# Patient Record
Sex: Male | Born: 1950 | Race: Black or African American | Hispanic: No | Marital: Single | Smoking: Former smoker
Health system: Southern US, Community
[De-identification: ages and names within clinical notes are randomized; demographics above are authoritative.]

## PROBLEM LIST (undated history)

## (undated) DIAGNOSIS — I639 Cerebral infarction, unspecified: Secondary | ICD-10-CM

## (undated) DIAGNOSIS — I10 Essential (primary) hypertension: Secondary | ICD-10-CM

## (undated) DIAGNOSIS — C801 Malignant (primary) neoplasm, unspecified: Secondary | ICD-10-CM

## (undated) DIAGNOSIS — Z5189 Encounter for other specified aftercare: Secondary | ICD-10-CM

## (undated) DIAGNOSIS — R569 Unspecified convulsions: Secondary | ICD-10-CM

## (undated) DIAGNOSIS — I219 Acute myocardial infarction, unspecified: Secondary | ICD-10-CM

## (undated) DIAGNOSIS — I509 Heart failure, unspecified: Secondary | ICD-10-CM

## (undated) DIAGNOSIS — J449 Chronic obstructive pulmonary disease, unspecified: Secondary | ICD-10-CM

## (undated) DIAGNOSIS — D689 Coagulation defect, unspecified: Secondary | ICD-10-CM

## (undated) DIAGNOSIS — N189 Chronic kidney disease, unspecified: Secondary | ICD-10-CM

## (undated) DIAGNOSIS — D649 Anemia, unspecified: Secondary | ICD-10-CM

## (undated) HISTORY — DX: Unspecified convulsions: R56.9

## (undated) HISTORY — DX: Malignant (primary) neoplasm, unspecified: C80.1

## (undated) HISTORY — DX: Encounter for other specified aftercare: Z51.89

## (undated) HISTORY — DX: Chronic obstructive pulmonary disease, unspecified: J44.9

## (undated) HISTORY — DX: Chronic kidney disease, unspecified: N18.9

## (undated) HISTORY — DX: Anemia, unspecified: D64.9

## (undated) HISTORY — DX: Essential (primary) hypertension: I10

## (undated) HISTORY — PX: PORT-A-CATH REMOVAL: SHX5289

## (undated) HISTORY — DX: Heart failure, unspecified: I50.9

## (undated) HISTORY — DX: Coagulation defect, unspecified: D68.9

## (undated) HISTORY — PX: TRACHEOSTOMY: SUR1362

## (undated) HISTORY — DX: Cerebral infarction, unspecified: I63.9

## (undated) HISTORY — PX: PICC LINE REMOVAL (ARMC HX): HXRAD1261

## (undated) HISTORY — DX: Acute myocardial infarction, unspecified: I21.9

## (undated) HISTORY — PX: GASTROSTOMY W/ FEEDING TUBE: SUR642

---

## 2016-03-08 ENCOUNTER — Other Ambulatory Visit: Payer: Self-pay | Admitting: Hematology and Oncology

## 2016-03-09 ENCOUNTER — Telehealth: Payer: Self-pay | Admitting: *Deleted

## 2016-03-09 NOTE — Telephone Encounter (Signed)
Oncology Nurse Navigator Documentation  Spoke with patient's niece, Lizbeth Wodrich, explained I have been unable to reach Mr. Kipps by phone.  She indicated she has moved her uncle to [place] to better facilitate his care/treatment.  I explained by role has their navigator, provided my contact information.  I informed her of 10/3 2:00 PM appt with Dr. Alvy Bimler, confirmed her understanding of [place] location, arrival/registration procedure.  She indicated caregiver Margie Ege will be accompanying him to this appointment. She understands she can call me with questions/concerns.  Gayleen Orem, RN, BSN, [place] at [place] [phone]

## 2016-03-09 NOTE — Telephone Encounter (Signed)
Oncology Nurse Navigator Documentation  Called Mr. Eans to place new referral introductory call, confirm 10/3 2:00 appt with Dr. Alvy Bimler.  LVM asking him to return my call.  Gayleen Orem, RN, BSN, [place] at [place] [phone]

## 2016-03-09 NOTE — Telephone Encounter (Signed)
Oncology Nurse Navigator Documentation  Located 08/21/13 pathology at [facility], forwarded information to Milinda Antis, [facility], for requesting slides; submitted fax request to [facility] for recent imaging, receipt confirmation received.  Patient to be presented at [facility] next week.   Gayleen Orem, RN, BSN, [place] at [place] [phone]

## 2016-03-13 ENCOUNTER — Telehealth: Payer: Self-pay | Admitting: *Deleted

## 2016-03-13 ENCOUNTER — Encounter: Payer: Self-pay | Admitting: Hematology and Oncology

## 2016-03-13 ENCOUNTER — Encounter: Payer: Self-pay | Admitting: *Deleted

## 2016-03-13 ENCOUNTER — Ambulatory Visit (HOSPITAL_BASED_OUTPATIENT_CLINIC_OR_DEPARTMENT_OTHER): Payer: Medicare Other | Admitting: Hematology and Oncology

## 2016-03-13 ENCOUNTER — Other Ambulatory Visit: Payer: Self-pay | Admitting: Hematology and Oncology

## 2016-03-13 ENCOUNTER — Telehealth: Payer: Self-pay | Admitting: Hematology and Oncology

## 2016-03-13 VITALS — BP 103/71 | HR 98 | Temp 97.5°F | Resp 18 | Ht 73.0 in | Wt 178.9 lb

## 2016-03-13 DIAGNOSIS — C7801 Secondary malignant neoplasm of right lung: Secondary | ICD-10-CM

## 2016-03-13 DIAGNOSIS — I878 Other specified disorders of veins: Secondary | ICD-10-CM | POA: Diagnosis not present

## 2016-03-13 DIAGNOSIS — J449 Chronic obstructive pulmonary disease, unspecified: Secondary | ICD-10-CM

## 2016-03-13 DIAGNOSIS — C139 Malignant neoplasm of hypopharynx, unspecified: Secondary | ICD-10-CM

## 2016-03-13 DIAGNOSIS — C7802 Secondary malignant neoplasm of left lung: Secondary | ICD-10-CM | POA: Diagnosis not present

## 2016-03-13 DIAGNOSIS — I209 Angina pectoris, unspecified: Secondary | ICD-10-CM

## 2016-03-13 DIAGNOSIS — I509 Heart failure, unspecified: Secondary | ICD-10-CM | POA: Insufficient documentation

## 2016-03-13 DIAGNOSIS — Z66 Do not resuscitate: Secondary | ICD-10-CM

## 2016-03-13 DIAGNOSIS — I2782 Chronic pulmonary embolism: Secondary | ICD-10-CM | POA: Diagnosis not present

## 2016-03-13 DIAGNOSIS — C7800 Secondary malignant neoplasm of unspecified lung: Secondary | ICD-10-CM | POA: Insufficient documentation

## 2016-03-13 DIAGNOSIS — Z23 Encounter for immunization: Secondary | ICD-10-CM | POA: Diagnosis present

## 2016-03-13 DIAGNOSIS — Z7189 Other specified counseling: Secondary | ICD-10-CM

## 2016-03-13 DIAGNOSIS — Z5111 Encounter for antineoplastic chemotherapy: Secondary | ICD-10-CM

## 2016-03-13 HISTORY — DX: Chronic obstructive pulmonary disease, unspecified: J44.9

## 2016-03-13 MED ORDER — NITROGLYCERIN 0.3 MG SL SUBL: 0.3000 mg | SUBLINGUAL_TABLET | SUBLINGUAL | 12 refills | Status: DC | PRN

## 2016-03-13 MED ORDER — INFLUENZA VAC SPLIT QUAD 0.5 ML IM SUSY
0.5000 mL | PREFILLED_SYRINGE | Freq: Once | INTRAMUSCULAR | Status: AC
  Administered 2016-03-13: 0.5 mL via INTRAMUSCULAR
  Filled 2016-03-13: qty 0.5

## 2016-03-13 NOTE — Telephone Encounter (Signed)
Oncology Nurse Navigator Documentation  Returned Mr. Tolley's niece's call, confirmed today's 2:00 PM appt with Dr. Alvy Bimler, encouraged 1:40 arrival to register.  She voiced understanding.  Gayleen Orem, RN, BSN, [place] at [place] [phone]

## 2016-03-13 NOTE — Telephone Encounter (Signed)
PATIENT ALSO GIVEN APPOINTMENT WITH DR Winfield 10/19

## 2016-03-13 NOTE — Telephone Encounter (Signed)
GAVE PATIENT AVS REPORT AND APPOINTMENTS FOR October AND November. DATES PER 10/3 LOS.

## 2016-03-13 NOTE — Progress Notes (Signed)
Met with Mr. Masso during initial consult with Dr. Alvy Bimler.  He was accompanied by family friend Margie Ege. 1. Further introduced myself as his/their Navigator, explained my role as a member of the Care Team. 2. Provided New Patient Information packet:  Contact information for physician, this navigator, other members of the Care Team  Advance Directive information ([place] blue pamphlet with LCSW insert).  He stated he has HCPOA, will bring copy.  I provided [facility] AD document for Living Will.  Fall Prevention Patient Safety Plan  Appointment Guideline  Financial Assistance Information sheet.  I indicated I will arrange Financial Counseling in conjunction with next appt.  Power with highlight of [place] 3. Assisted with post-consult appt scheduling.   They verbalized understanding of information provided. I encouraged them to call with questions/concerns, they verbalized understanding.  Gayleen Orem, RN, BSN, [place] at [place] [phone]

## 2016-03-14 DIAGNOSIS — I2699 Other pulmonary embolism without acute cor pulmonale: Secondary | ICD-10-CM | POA: Insufficient documentation

## 2016-03-14 DIAGNOSIS — I878 Other specified disorders of veins: Secondary | ICD-10-CM | POA: Insufficient documentation

## 2016-03-14 NOTE — Assessment & Plan Note (Signed)
The patient has past history of extensive smoking and severe emphysema. He has significant shortness of breath on minimal exertion. He requests oxygen therapy. Based on current vital signs, the patient does not qualify for oxygen therapy. However, I plan to refer him to pulmonology clinic for further evaluation

## 2016-03-14 NOTE — Assessment & Plan Note (Signed)
We discussed CODE STATUS today and the patient confirmed DO NOT RESUSCITATE

## 2016-03-14 NOTE — Assessment & Plan Note (Signed)
He had history of DVT and PE. He is on chronic anticoagulation therapy. The patient denies any recent signs or symptoms of bleeding such as spontaneous epistaxis, hematuria or hematochezia.

## 2016-03-14 NOTE — Assessment & Plan Note (Signed)
The patient has significant history of coronary artery disease and atrial fibrillation in the past. He has regular angina symptoms. I refill his prescription nitroglycerin. According to his caregiver, he has appointment to see cardiologist soon

## 2016-03-14 NOTE — Assessment & Plan Note (Signed)
The patient have history of thrombosis and PE. He is on chronic anticoagulation therapy. His initial thrombotic event was precipitated by port placement and PICC line placement. He is noted to have poor venous access but the patient declined placement of PICC line or port for chemotherapy

## 2016-03-14 NOTE — Progress Notes (Signed)
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 male is here, transfer of care from [place] after the patient moved to live [place] a month ago His caregiver, Tye [place] is present. I review his outside records extensively and summarized as follows:   Hypopharyngeal cancer ([place])   08/23/2013 Imaging    A CT of the neck on 08/23/13 showed a supraglottic mass 3.0X3.5cm extending to true vocal cords to above the hyoid involving the right aryepiglottic fold. No lymphadenopathy in the neck. A CT of the chest on 08/23/13 showed bullous emphysema and 2 nodules possibly representing metastatic disease including a 1.4cm left upper lobe nodule and a 1.4cm nodule in the right middle lobe.              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 Bilateral hypermetabolic pulmonary nodules are concerning for bilateral lung metastases.      09/14/2013 - 11/09/2013 Chemotherapy    The patient had weekly cisplatin x 3 with radiation at [facility]. Chemo was stopped due to intolerable toxicities       09/14/2013 - 10/12/2013 Radiation Therapy    He received radiation therapy at [facility]. IMRT was used to treat the primary tumor and regional lymphatics to 4600cGy in 200cGy daily fractions over 23 days followed by a boost of 2400cGy in 12 days to a total dose of 7000cGy.        09/22/2013 Procedure    He has placement of port       09/29/2013 Procedure    He has placement of feeding tube      10/12/2013 Procedure    He has removal of port      10/17/2013 Imaging    Ct neck showed thrombus is present in the jugular  vein from the skull base to the brachiocephalic vein. A small venous branch or collateral also demonstrates thrombus in the anterior right neck. The SVC is patent. The right-sided laryngeal mass is smaller with decreased but persistent right to left mass effect on the airway. Severe atherosclerotic changes at the bifurcation of the carotid arteries on the left, greater than right. In the dermis of the upper anterior right chest wall there is a small fluid and air collection measuring 2.3 x 1.1 cm.      10/17/2013 Imaging    Ct chest showed The SVC is patent however there is thrombus extending throughout the jugular vein to just below the brachiocephalic vein. There is also likely thrombus and a small peripheral vein in the anterior right neck. Several pulmonary nodules, one larger nodule appears smaller with another larger nodule stable and several new small pulmonary nodules. Follow-up recommended. New mild right lower lobe airspace disease with several nodular opacities. This may represent infection. No pleural effusion.      01/01/2014 Imaging    Ct chest showed Interval improvement in heterogeneous right lower lobe opacity, likely improving infection/aspiration. Previously visualized FDG avid left upper lobe nodule is less solid compared to most recent prior but not significantly changed in size. Previously visualized FDG avid right middle lobe nodule has increased in size compared to most recent  prior, likely disease progression. Significant interval increase in size of left upper lobe nodules consistent with progressive metastatic disease. Other smaller previously visualized nodules are not visualized and were likely inflammatory/infectious in etiology.      01/01/2014 Imaging    Ct neck showed thrombus present in the jugular vein from skull base to brachiocephalic vein, the SVC remains patent. Extensive atherosclerotic calcifications of the carotid bifurcations left greater than right the mass in  the hypopharynx. Improved appearance of the hypopharyngeal mass with less edema and last less shift to the left. Less edema and fluid within the adjacent soft tissues also demonstrated. Increased fluid in the retropharyngeal prevertebral soft tissues.      04/15/2014 Imaging    CT pulmonary arteriogram demonstrates no evidence of pulmonary embolism. Bilateral pulmonary lesions demonstrated enlarged since the prior study. A new lesion is also seen within the lingula. Findings would be typical of pulmonary metastatic lesions. Focal parenchymal infiltrate demonstrated in the right lower lobe. Left basilar atelectasis. Middle lobe infiltrate or atelectasis demonstrated with underlying bronchiectasis. Lingular infiltrate or atelectasis also      05/03/2014 Imaging    Ct chest showed interval enlargement of at least 2 pulmonary nodules as described above. Findings are suspicious for worsening disease.  Interval development of consolidation within the lateral segment right middle lobe could be related to a filling defect in a branch of the lateral segment right middle lobe bronchus. The consolidation favored to represent atelectasis. The endobronchial focus could represent mucoid impactionor possibly a fixed endobronchial lesion. Aspiration would be less likely given its location in the right middle lobe.      05/03/2014 Imaging    Ct neck showed no significant change in right hypopharyngeal mass. Stable airway asymmetry. Ill-defined soft tissue along the carotid space carotid encasement mildly improved. Multispatial soft tissue stranding/fluid has improved. Right internal jugular vein not visualized..      05/13/2014 Procedure    Tracheostomy was removed         06/16/2014 - 03/16/2015 Chemotherapy    The patient had carboplatin AUC 2 and Taxol 45 mg/m2 at [facility]       06/16/2014 Imaging    Ct chest showed stable dominant nodules from recent prior. Presumably, metastatic disease. New increased  groundglass attenuation opacities, right lower lobe, centrilobular and clumped nodules, as well as a more dominant nodular opacity. While these findings could be secondary to progressive metastatic disease, given their location and appearance, aspiration pneumonia/pneumonitis is also possible.      06/21/2014 Imaging    Ct abdomen showed gastrostomy tube is positioned within the stomach. No evidence for an acute inflammatory process within the abdomen or pelvis. No large or small bowel obstruction. No change right lower lobe airspace disease and nodular opacities      09/30/2014 Imaging    Ct chest showed interval decrease in size of metastatic pulmonary nodules. One of these nodules demonstrates new cavitation within the right upper lobe. Interval improvement of nodular opacities within the right lower lobe suggestive of a resolving aspiration.      04/01/2015 Imaging    Ct chest showed small amount of right upper lobe acute, nonocclusive pulmonary embolus. Enlarged left upper lobe pulmonary metastasis. Mixed response with resolved previous smaller nodule in the lingula. Right middle and right lower lobe appearance likely represents posttreatment cicatrization atelectasis (query if radiation therapy has been performed in this region). Worsened right infrahilar bronchial wall thickening with occlusion of a subsegmental branch. No evidence for postobstructive pneumonia.  Paraseptal emphysema.      04/19/2015 - 02/29/2016 Chemotherapy    The patient had Keytruda at [facility].  He subsequently relocated to [facility]       04/29/2015 Imaging    Ct neck showed interval significant reduction in size of previous large right supraglottic mass. Airway is now widely patent at this level. No definite discrete supraglottic or pharyngeal mass demonstrated currently by CT. Left upper lobe spiculated mass. Prominent calcified atherosclerotic plaquing involving the carotid bifurcations and carotid bulbs greater on the  left resulting in stenosis of the internal carotid artery origins. Consider follow-up carotid Doppler ultrasound exam for further evaluation.      07/13/2015 Imaging    Ct neck showed expected post-treatment changes in the neck without evidence of recurrent disease in the primary site. No abnormal lymph nodes. Significant biapical bullous disease /paraseptal emphysema in the lungs, unchanged from prior. Previously seen left upper lobe nodular mass is visualized on the same-day CT chest.       07/13/2015 Imaging    Ct chest showed interval decrease in size of a spiculated left upper lobe nodule. Numerous additional nonspecific pulmonary nodules of the bilateral lungs, which are stable compared to prior examination. Clustered tree-in-bud nodules of the right lung base are likely sequelae of Aspiration. Bronchiectasis and atelectasis of the right middle lobe. Emphysema.      08/24/2015 Miscellaneous    He received IV feraheme      09/20/2015 Imaging    CT scan showed acute, comminuted, medial right clavicular fracture and marked, overlying soft tissue swelling. Mild, acute, T10 compression fracture.  The known left upper lobe carcinoma has modestly decreased in size since the 04/01/2015 comparison. Perhaps there has been a response to interim therapy. Right middle lobe and lower lobe bronchiectasis and pleuroparenchymal scarring were also present on the 04/01/2015 comparison. Confluent consolidation, however, is less striking than on the prior. Perhaps this is residua of prior pneumonia or radiation. Marked bullous emphysema is unchanged. Advanced, 3-vessel coronary artery disease. The liver is unusual in shape, but the findings are not definitive for cirrhosis. The appearance is unchanged since 08/19/2014.      09/20/2015 Imaging    Ct head showed mild patchy low-density present in the periventricular white matter. No mass, hemorrhage, or extracerebral fluid.  Mild supratentorial ventricle and sulcal  prominence within normal limits.  A component of diffuse cerebellar atrophy is present. Carotid siphon and distal left vertebral vascular calcifications are present. On bone windows, no calvarial lesion. Normal mastoid aeration. Mucosal thickening left posterior ethmoid air cells and retention cyst left maxillary sinus.      10/05/2015 Imaging    CT chest, abdomen and pelvis showed increasing size of spiculated nodule in the left upper lobe. Increasing adjacent groundglass opacities are concerning for lymphangitic spread of tumor. Additional pulmonary nodules are unchanged. Multiple mildly enlarged mesenteric lymph nodes, similar to prior. Sclerotic changes and height loss of the T10 vertebral body, new from prior. This is concerning for osseous metastatic disease. Extensive atherosclerotic disease at the origin of the celiac and throughout the proximal superior mesenteric artery.      10/05/2015 Imaging    Ct neck showed decreased supraglottic edema, with residual asymmetric fullness right greater than left. Stable post-therapeutic retropharyngeal edema. Comminuted fracture of the proximal right clavicle/clavicular head with surrounding soft tissue and pectoral stranding, new in the interval. Findings could be posttraumatic and/or pathologic.      11/16/2015 Imaging    Ct chest showed stable dominant  left upper lobe nodule, and surrounding opacities which may be related to radiation. New 10-14-mm left upper lobe nodule, anterior to dominant nodule and radiation change. While this could also represent radiation change, infection or neoplasm are in the differential diagnosis      12/27/2015 Imaging    Ct chest showed stable to slight decrease in nodular consolidative opacities, which are likely related to prior therapy. No new abnormalities      01/31/2016 Imaging    CT brain at [facility] showed no metastatic disease. No acute intracranial process.      His treatment course at [facility] was complicated by  failure to thrive, recurrent infection with severe pneumonia, DVT related to line placement, PE, congestive heart failure, atrial fibrillation with rapid ventricular response, severe anemia requiring blood transfusions and occasional renal dysfunction. The patient also has history of seizure disorder and stroke but denies permanent neurological deficits. He has chronic, regular angina symptoms. He ran out of medications a month ago and has not taken any nitroglycerin recently. His last episode of chest pain was a week ago. The patient also have chronic shortness of breath with minimal exertion. He requests oxygen therapy. He denies changes in appetite, nausea, abnormal weight loss or recent peripheral neuropathy. The patient denies any recent signs or symptoms of bleeding such as spontaneous epistaxis, hematuria or hematochezia.  MEDICAL HISTORY:  Past Medical History:  Diagnosis Date  . Anemia   . Blood transfusion without reported diagnosis   . Cancer ([place])   . CHF (congestive heart failure) ([place])   . Chronic kidney disease   . Clotting disorder ([place])   . COPD (chronic obstructive pulmonary disease) ([place])   . Hypertension   . Myocardial infarction   . Seizures ([place])   . Stroke [facility])     SURGICAL HISTORY: Past Surgical History:  Procedure Laterality Date  . GASTROSTOMY W/ FEEDING TUBE    . PICC LINE REMOVAL (No Name HX)    . PORT-A-CATH REMOVAL    . TRACHEOSTOMY      SOCIAL HISTORY: Social History   Social History  . Marital status: Single    Spouse name: N/A  . Number of children: 2  . Years of education: N/A   Occupational History  . unemployed    Social History Main Topics  . Smoking status: Former Smoker    Packs/day: 1.00    Years: 35.00    Types: Cigarettes    Quit date: 02/08/2016  . Smokeless tobacco: Never Used  . Alcohol use 3.6 oz/week    6 Cans of beer per week  . Drug use: No  . Sexual activity: Not on file   Other Topics Concern  . Not on file    Social History Narrative   Judeen Hammans (brother's daughter) niece MPOA lives with patient    FAMILY HISTORY: History reviewed. No pertinent family history.  ALLERGIES:  is allergic to lisinopril.  MEDICATIONS:  Current Outpatient Prescriptions  Medication Sig Dispense Refill  . amLODipine (NORVASC) 10 MG tablet Take by mouth.    . diltiazem (CARDIZEM CD) 180 MG 24 hr capsule Take by mouth.    . gabapentin (NEURONTIN) 100 MG capsule Take by mouth.    Marland Kitchen guaiFENesin-codeine (ROBITUSSIN AC) 100-10 MG/5ML syrup Take by mouth.    . isosorbide dinitrate (ISORDIL) 20 MG tablet Take by mouth.    . magnesium oxide (MAG-OX) 400 MG tablet Take by mouth.    . OLANZapine (ZYPREXA) 5 MG tablet Take by mouth.    Marland Kitchen  ondansetron (ZOFRAN-ODT) 8 MG disintegrating tablet Take by mouth.    . oxyCODONE (OXY IR/ROXICODONE) 5 MG immediate release tablet Take 1 tab every 4 hours prn pain or shortness of breath    . pantoprazole (PROTONIX) 40 MG tablet Take by mouth.    . prochlorperazine (COMPAZINE) 10 MG tablet Take by mouth.    . rivaroxaban (XARELTO) 20 MG TABS tablet Take by mouth.    Marland Kitchen albuterol (ACCUNEB) 1.25 MG/3ML nebulizer solution Inhale into the lungs.    Marland Kitchen albuterol (PROVENTIL HFA;VENTOLIN HFA) 108 (90 Base) MCG/ACT inhaler Inhale into the lungs.    . nitroGLYCERIN (NITROSTAT) 0.3 MG SL tablet Place 1 tablet (0.3 mg total) under the tongue every 5 (five) minutes as needed for chest pain. 90 tablet 12   No current facility-administered medications for this visit.     REVIEW OF SYSTEMS:   Constitutional: Denies fevers, chills or abnormal night sweats Eyes: Denies blurriness of vision, double vision or watery eyes Ears, nose, mouth, throat, and face: Denies mucositis or sore throat Gastrointestinal:  Denies nausea, heartburn or change in bowel habits Skin: Denies abnormal skin rashes Lymphatics: Denies new lymphadenopathy or easy bruising Neurological:Denies numbness, tingling or new  weaknesses Behavioral/Psych: Mood is stable, no new changes  All other systems were reviewed with the patient and are negative.  PHYSICAL EXAMINATION: ECOG PERFORMANCE STATUS: 2 - Symptomatic, <50% confined to bed  Vitals:   03/13/16 1424  BP: 103/71  Pulse: 98  Resp: 18  Temp: 97.5 F (36.4 C)   Filed Weights   03/13/16 1424  Weight: 178 lb 14.4 oz (81.1 kg)    GENERAL:alert, no distress and comfortable. SKIN: skin color, texture, turgor are normal, no rashes or significant lesions EYES: normal, conjunctiva are pink and non-injected, sclera clear OROPHARYNX:no exudate, no erythema and lips, buccal mucosa, and tongue normal  NECK: Noted mild woodiness around his neck from prior radiation with well-healed tracheostomy scar LYMPH:  no palpable lymphadenopathy in the cervical, axillary or inguinal LUNGS: clear to auscultation and percussion with normal breathing effort HEART: regular rate & rhythm and no murmurs and no lower extremity edema ABDOMEN:abdomen soft, non-tender and normal bowel sounds Musculoskeletal:no cyanosis of digits and no clubbing  PSYCH: alert & oriented x 3 with mild dysarthria  NEURO: no focal motor/sensory deficits  LABORATORY DATA: I review his last set of blood work from [facility] I have reviewed the data as listed RADIOGRAPHIC STUDIES:I review all his imaging studies  ASSESSMENT & PLAN:  Hypopharyngeal cancer ([place]) The patient has complex history of stage IV metastatic hypopharyngeal carcinoma to the lungs, status post radiation therapy and is receiving systemic treatment. He relocated to [facility] a month due to family support. He appears to respond well to treatment and his last imaging study in August 2017 shows stable disease I will resume treatment next week and plan on repeating imaging study in November to assess response to treatment. Role of treatment is palliative and it is a recommendation according to NCCN guidelines  On January 14, 2015, the  [place]. Food and Drug Administration granted accelerated approval to pembrolizumab for the treatment of patients with recurrent or metastatic head and neck squamous cell carcinoma (HNSCC) with disease progression on or after platinum-containing chemotherapy.   The approval was based on demonstration of a durable objective response rate (ORR) in a subgroup of patients in an international, multicenter, non-randomized, open-label, multi-cohort study. This subgroup included 174 patients with recurrent or metastatic HNSCC who had disease progression on  or after platinum-containing chemotherapy. Patients received intravenous pembrolizumab 10 mg/kg every 2 weeks or 200 mg every 3 weeks.   ORR was determined by an independent review committee according to Response Evaluation Criteria in Solid Tumors (RECIST) 1.1. The ORR for these 174 patients was 16% (95% confidence interval [CI] 11, 22). The median response duration had not been reached at the time of analysis. The range for duration of response was 2.4 months to 27.7 months (response ongoing). Among the 28 responding patients, 23 (82%) had responses of 6 months or longer.   Safety data was evaluated in 192 patients with HNSCC receiving at least one dose of pembrolizumab 10 mg/kg every 2 weeks or 200 mg every 3 weeks. The most common (greater than or equal to 20%) adverse reactions were fatigue, decreased appetite, and dyspnea. Adverse reactions occurring in patients with HNSCC were similar to those occurring in patients with melanoma or NSCLC, with the exception of an increased incidence of facial edema (10% all grades, 2.1% grades 3-4) and new or worsening hypothyroidism (14.6% all grades). The most frequent (greater than or equal to 2%) serious adverse reactions were pneumonia, dyspnea, confusional state, vomiting, pleural effusion, and respiratory failure. Clinically significant immune-mediated adverse reactions included pneumonitis, colitis, hepatitis, adrenal  insufficiency, diabetes mellitus, skin toxicity, myositis, and thyroid disorders.   The recommended dose and schedule of pembrolizumab for this indication is 200 mg administered as an intravenous infusion over 30 minutes every 3 weeks.   We discussed some of the risks, benefits, side-effects of Pembrolizumab  Some of the short term side-effects included, though not limited to, risk of fatigue, pancytopenia, life-threatening infections, allergic reactions, nausea, vomiting, hepatitis, changes in bowel habits especially diarrhea, admission to hospital for various reasons, and risks of death.   The patient is aware that the response rates discussed earlier is not guaranteed.    After a long discussion, patient made an informed decision to proceed.   Patient education material was dispensed. The patient declined port placement. I will scheduled chemotherapy education class work and start of treatment next week per patient request  Angina pectoris [facility]) The patient has significant history of coronary artery disease and atrial fibrillation in the past. He has regular angina symptoms. I refill his prescription nitroglycerin. According to his caregiver, he has appointment to see cardiologist soon  COPD (chronic obstructive pulmonary disease) ([place]) The patient has past history of extensive smoking and severe emphysema. He has significant shortness of breath on minimal exertion. He requests oxygen therapy. Based on current vital signs, the patient does not qualify for oxygen therapy. However, I plan to refer him to pulmonology clinic for further evaluation  DNR (do not resuscitate) We discussed CODE STATUS today and the patient confirmed DO NOT RESUSCITATE  Goals of care, counseling/discussion The patient is aware he has stage IV disease and treatment is strictly palliative. We discussed importance of Advanced Directives and Living will. I will get assistance from our social worker to help  him fill out some paperwork. We discussed CODE STATUS; the patient desires DNR We also discussed potential referral for home base palliative care but the patient declined for now. We discussed goals of care. The patient does not wish to discuss prognosis.   Poor venous access The patient have history of thrombosis and PE. He is on chronic anticoagulation therapy. His initial thrombotic event was precipitated by port placement and PICC line placement. He is noted to have poor venous access but the patient declined placement of PICC  line or port for chemotherapy  Pulmonary embolism ([place]) He had history of DVT and PE. He is on chronic anticoagulation therapy. The patient denies any recent signs or symptoms of bleeding such as spontaneous epistaxis, hematuria or hematochezia.    Orders Placed This Encounter  Procedures  . CT CHEST WO CONTRAST    Standing Status:   Future    Standing Expiration Date:   04/17/2017    Order Specific Question:   Reason for exam:    Answer:   staging metastatic hypopharyngeal ca to lungs, assess response to Rx    Order Specific Question:   Preferred imaging location?    Answer:   [facility]  . CT Soft Tissue Neck Wo Contrast    Standing Status:   Future    Standing Expiration Date:   04/17/2017    Order Specific Question:   Reason for Exam (SYMPTOM  OR DIAGNOSIS REQUIRED)    Answer:   staging metastatic hypopharyngeal ca to lungs, assess response to Rx    Order Specific Question:   Preferred imaging location?    Answer:   [facility]  . CBC with Differential    Standing Status:   Standing    Number of Occurrences:   20    Standing Expiration Date:   03/14/2017  . Comprehensive metabolic panel    Standing Status:   Standing    Number of Occurrences:   20    Standing Expiration Date:   03/14/2017  . TSH    Standing Status:   Standing    Number of Occurrences:   22    Standing Expiration Date:   03/13/2017  . Ambulatory referral to  Pulmonology    Referral Priority:   Routine    Referral Type:   Consultation    Referral Reason:   Specialty Services Required    Requested Specialty:   Pulmonary Disease    Number of Visits Requested:   1    All questions were answered. The patient knows to call the clinic with any problems, questions or concerns. I spent 60 minutes counseling the patient face to face. The total time spent in the appointment was 110 minutes and more than 50% was on counseling.     Heath Lark, MD 03/14/16 1:52 PM

## 2016-03-14 NOTE — Assessment & Plan Note (Signed)
The patient is aware he has stage IV disease and treatment is strictly palliative. We discussed importance of Advanced Directives and Living will. I will get assistance from our social worker to help him fill out some paperwork. We discussed CODE STATUS; the patient desires DNR We also discussed potential referral for home base palliative care but the patient declined for now. We discussed goals of care. The patient does not wish to discuss prognosis.

## 2016-03-14 NOTE — Progress Notes (Signed)
START OFF PATHWAY REGIMEN - Head and Neck  Off Pathway: Pembrolizumab 200 mg q21 Days  [id number]:Pembrolizumab 200 mg q21 Days:   A cycle is 21 days:     Pembrolizumab [facility])) 200 mg flat dose in 50 mL NS IV over 30 minutes every 21 days.  Inline filter required (low-protein binding) Dose Mod: None Additional Orders: Severe immune-mediated reactions can occur. See prescribing information for more details and required immediate management with steroids. Monitor thyroid, renal, liver function tests, glucose, and sodium at baseline and before each  dose of pembrolizumab. Ref: Keytruda(R) (pembrolizumab) prescribing information, 2016.  **Always confirm dose/schedule in your pharmacy ordering system**    Patient Characteristics: Hypopharynx, Metastatic (Squamous Cell), Third Line Disease Classification: Hypopharynx AJCC Stage Grouping: IVC AJCC M Stage: 1 AJCC N Stage: 0 AJCC T Stage: 3 Current Disease Status: Distant Metastases Line of therapy: Third Line Would you be surprised if this patient died  in the next year? I would be surprised if this patient died in the next year  Intent of Therapy: Non-Curative / Palliative Intent, Discussed with Patient

## 2016-03-14 NOTE — Assessment & Plan Note (Addendum)
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 He relocated to [facility] a month due to family support. He appears to respond well to treatment and his last imaging study in August 2017 shows stable disease I will resume treatment next week and plan on repeating imaging study in November to assess response to treatment. Role of treatment is palliative and it is a recommendation according to NCCN guidelines  On January 14, 2015, the U. S. Food and Drug Administration granted accelerated approval to pembrolizumab for the treatment of patients with recurrent or metastatic head and neck squamous cell carcinoma (HNSCC) with disease progression on or after platinum-containing chemotherapy.   The approval was based on demonstration of a durable objective response rate (ORR) in a subgroup of patients in an international, multicenter, non-randomized, open-label, multi-cohort study. This subgroup included 174 patients with recurrent or metastatic HNSCC who had disease progression on or after platinum-containing chemotherapy. Patients received intravenous pembrolizumab 10 mg/kg every 2 weeks or 200 mg every 3 weeks.   ORR was determined by an independent review committee according to Response Evaluation Criteria in Solid Tumors (RECIST) 1.1. The ORR for these 174 patients was 16% (95% confidence interval [CI] 11, 22). The median response duration had not been reached at the time of analysis. The range for duration of response was 2.4 months to 27.7 months (response ongoing). Among the 28 responding patients, 23 (82%) had responses of 6 months or longer.   Safety data was evaluated in 192 patients with HNSCC receiving at least one dose of pembrolizumab 10 mg/kg every 2 weeks or 200 mg every 3 weeks. The most common (greater than or equal to 20%) adverse reactions were fatigue, decreased appetite, and  dyspnea. Adverse reactions occurring in patients with HNSCC were similar to those occurring in patients with melanoma or NSCLC, with the exception of an increased incidence of facial edema (10% all grades, 2.1% grades 3-4) and new or worsening hypothyroidism (14.6% all grades). The most frequent (greater than or equal to 2%) serious adverse reactions were pneumonia, dyspnea, confusional state, vomiting, pleural effusion, and respiratory failure. Clinically significant immune-mediated adverse reactions included pneumonitis, colitis, hepatitis, adrenal insufficiency, diabetes mellitus, skin toxicity, myositis, and thyroid disorders.   The recommended dose and schedule of pembrolizumab for this indication is 200 mg administered as an intravenous infusion over 30 minutes every 3 weeks.   We discussed some of the risks, benefits, side-effects of Pembrolizumab  Some of the short term side-effects included, though not limited to, risk of fatigue, pancytopenia, life-threatening infections, allergic reactions, nausea, vomiting, hepatitis, changes in bowel habits especially diarrhea, admission to hospital for various reasons, and risks of death.   The patient is aware that the response rates discussed earlier is not guaranteed.    After a long discussion, patient made an informed decision to proceed.   Patient education material was dispensed. The patient declined port placement. I will scheduled chemotherapy education class work and start of treatment next week per patient request

## 2016-03-20 ENCOUNTER — Other Ambulatory Visit: Payer: Self-pay | Admitting: Hematology and Oncology

## 2016-03-20 DIAGNOSIS — C7800 Secondary malignant neoplasm of unspecified lung: Secondary | ICD-10-CM

## 2016-03-20 DIAGNOSIS — Z23 Encounter for immunization: Secondary | ICD-10-CM

## 2016-03-20 DIAGNOSIS — C139 Malignant neoplasm of hypopharynx, unspecified: Secondary | ICD-10-CM

## 2016-03-22 ENCOUNTER — Other Ambulatory Visit: Payer: Self-pay | Admitting: *Deleted

## 2016-03-22 ENCOUNTER — Ambulatory Visit (HOSPITAL_BASED_OUTPATIENT_CLINIC_OR_DEPARTMENT_OTHER): Payer: Medicare Other

## 2016-03-22 ENCOUNTER — Encounter: Payer: Self-pay | Admitting: *Deleted

## 2016-03-22 ENCOUNTER — Other Ambulatory Visit: Payer: Medicare Other

## 2016-03-22 ENCOUNTER — Encounter: Payer: Self-pay | Admitting: Hematology and Oncology

## 2016-03-22 ENCOUNTER — Other Ambulatory Visit (HOSPITAL_BASED_OUTPATIENT_CLINIC_OR_DEPARTMENT_OTHER): Payer: Medicare Other

## 2016-03-22 VITALS — BP 105/78 | HR 96 | Temp 98.0°F | Resp 16

## 2016-03-22 DIAGNOSIS — Z5111 Encounter for antineoplastic chemotherapy: Secondary | ICD-10-CM

## 2016-03-22 DIAGNOSIS — C7800 Secondary malignant neoplasm of unspecified lung: Secondary | ICD-10-CM | POA: Diagnosis not present

## 2016-03-22 DIAGNOSIS — C139 Malignant neoplasm of hypopharynx, unspecified: Secondary | ICD-10-CM

## 2016-03-22 DIAGNOSIS — Z79899 Other long term (current) drug therapy: Secondary | ICD-10-CM

## 2016-03-22 DIAGNOSIS — Z5112 Encounter for antineoplastic immunotherapy: Secondary | ICD-10-CM

## 2016-03-22 LAB — COMPREHENSIVE METABOLIC PANEL
ALBUMIN: 2.7 g/dL — AB (ref 3.5–5.0)
AST: 16 U/L (ref 5–34)
Alkaline Phosphatase: 62 U/L (ref 40–150)
Anion Gap: 10 mEq/L (ref 3–11)
BUN: 8.1 mg/dL (ref 7.0–26.0)
CALCIUM: 8.9 mg/dL (ref 8.4–10.4)
CHLORIDE: 98 meq/L (ref 98–109)
CO2: 27 mEq/L (ref 22–29)
CREATININE: 0.9 mg/dL (ref 0.7–1.3)
EGFR: >90 mL/min/{1.73_m2} (ref >90)
GLUCOSE: 114 mg/dL (ref 70–140)
Potassium: 3.6 mEq/L (ref 3.5–5.1)
SODIUM: 135 meq/L — AB (ref 136–145)
Total Bilirubin: 0.51 mg/dL (ref 0.20–1.20)
Total Protein: 7.2 g/dL (ref 6.4–8.3)

## 2016-03-22 LAB — CBC WITH DIFFERENTIAL/PLATELET
BASO%: 0.3 % (ref 0.0–2.0)
Basophils Absolute: 0 10*3/uL (ref 0.0–0.1)
EOS%: 5.9 % (ref 0.0–7.0)
Eosinophils Absolute: 0.4 10*3/uL (ref 0.0–0.5)
HEMATOCRIT: 28.2 % — AB (ref 38.4–49.9)
HEMOGLOBIN: 8.8 g/dL — AB (ref 13.0–17.1)
LYMPH#: 1.3 10*3/uL (ref 0.9–3.3)
LYMPH%: 22.4 % (ref 14.0–49.0)
MCH: 26.1 pg — ABNORMAL LOW (ref 27.2–33.4)
MCHC: 31.2 g/dL — ABNORMAL LOW (ref 32.0–36.0)
MCV: 83.7 fL (ref 79.3–98.0)
MONO#: 0.9 10*3/uL (ref 0.1–0.9)
MONO%: 14.6 % — ABNORMAL HIGH (ref 0.0–14.0)
NEUT%: 56.8 % (ref 39.0–75.0)
NEUTROS ABS: 3.4 10*3/uL (ref 1.5–6.5)
Platelets: 301 10*3/uL (ref 140–400)
RBC: 3.37 10*6/uL — ABNORMAL LOW (ref 4.20–5.82)
RDW: 20.4 % — AB (ref 11.0–14.6)
WBC: 6 10*3/uL (ref 4.0–10.3)
nRBC: 0 % (ref 0–0)

## 2016-03-22 LAB — TSH: TSH: 3.634 m(IU)/L (ref 0.320–4.118)

## 2016-03-22 LAB — TECHNOLOGIST REVIEW

## 2016-03-22 MED ORDER — ALBUTEROL SULFATE 1.25 MG/3ML IN NEBU: 1.0000 | INHALATION_SOLUTION | Freq: Four times a day (QID) | RESPIRATORY_TRACT | 0 refills | Status: DC | PRN

## 2016-03-22 MED ORDER — DILTIAZEM HCL ER COATED BEADS 180 MG PO CP24: 180.0000 mg | ORAL_CAPSULE | Freq: Every day | ORAL | 0 refills | Status: DC

## 2016-03-22 MED ORDER — ISOSORBIDE DINITRATE 20 MG PO TABS: 20.0000 mg | ORAL_TABLET | Freq: Two times a day (BID) | ORAL | 0 refills | Status: DC

## 2016-03-22 MED ORDER — SODIUM CHLORIDE 0.9 % IV SOLN
Freq: Once | INTRAVENOUS | Status: AC
  Administered 2016-03-22: 13:00:00 via INTRAVENOUS

## 2016-03-22 MED ORDER — PROCHLORPERAZINE MALEATE 10 MG PO TABS: 10.0000 mg | ORAL_TABLET | Freq: Four times a day (QID) | ORAL | 0 refills | Status: DC | PRN

## 2016-03-22 MED ORDER — OXYCODONE HCL 5 MG PO TABS: 5.0000 mg | ORAL_TABLET | ORAL | 0 refills | Status: DC | PRN

## 2016-03-22 MED ORDER — PANTOPRAZOLE SODIUM 40 MG PO TBEC: 40.0000 mg | DELAYED_RELEASE_TABLET | Freq: Every day | ORAL | 0 refills | Status: DC

## 2016-03-22 MED ORDER — MAGNESIUM OXIDE 400 MG PO TABS: 400.0000 mg | ORAL_TABLET | Freq: Every day | ORAL | 0 refills | Status: DC

## 2016-03-22 MED ORDER — SODIUM CHLORIDE 0.9 % IV SOLN
200.0000 mg | Freq: Once | INTRAVENOUS | Status: AC
  Administered 2016-03-22: 200 mg via INTRAVENOUS
  Filled 2016-03-22: qty 8

## 2016-03-22 MED ORDER — AMLODIPINE BESYLATE 10 MG PO TABS: 10.0000 mg | ORAL_TABLET | Freq: Every day | ORAL | 0 refills | Status: DC

## 2016-03-22 MED ORDER — RIVAROXABAN 20 MG PO TABS: 20.0000 mg | ORAL_TABLET | Freq: Every day | ORAL | 0 refills | Status: DC

## 2016-03-22 MED ORDER — ALBUTEROL SULFATE HFA 108 (90 BASE) MCG/ACT IN AERS: 2.0000 | INHALATION_SPRAY | RESPIRATORY_TRACT | 0 refills | Status: DC | PRN

## 2016-03-22 MED ORDER — GABAPENTIN 100 MG PO CAPS: 100.0000 mg | ORAL_CAPSULE | Freq: Every day | ORAL | 0 refills | Status: DC

## 2016-03-22 MED ORDER — OLANZAPINE 5 MG PO TABS: 5.0000 mg | ORAL_TABLET | Freq: Every day | ORAL | 0 refills | Status: DC

## 2016-03-22 MED ORDER — ONDANSETRON 8 MG PO TBDP: 8.0000 mg | ORAL_TABLET | Freq: Three times a day (TID) | ORAL | 0 refills | Status: DC | PRN

## 2016-03-22 NOTE — Patient Instructions (Signed)
Pembrolizumab injection What is this medicine? PEMBROLIZUMAB (pem broe liz ue mab) is a monoclonal antibody. It is used to treat melanoma and non-small cell lung cancer. This medicine may be used for other purposes; ask your health care provider or pharmacist if you have questions. What should I tell my health care provider before I take this medicine? They need to know if you have any of these conditions: -diabetes -immune system problems -inflammatory bowel disease -liver disease -lung or breathing disease -lupus -an unusual or allergic reaction to pembrolizumab, other medicines, foods, dyes, or preservatives -pregnant or trying to get pregnant -breast-feeding How should I use this medicine? This medicine is for infusion into a vein. It is given by a health care professional in a hospital or clinic setting. A special MedGuide will be given to you before each treatment. Be sure to read this information carefully each time. Talk to your pediatrician regarding the use of this medicine in children. Special care may be needed. Overdosage: If you think you have taken too much of this medicine contact a poison control center or emergency room at once. NOTE: This medicine is only for you. Do not share this medicine with others. What if I miss a dose? It is important not to miss your dose. Call your doctor or health care professional if you are unable to keep an appointment. What may interact with this medicine? Interactions have not been studied. Give your health care provider a list of all the medicines, herbs, non-prescription drugs, or dietary supplements you use. Also tell them if you smoke, drink alcohol, or use illegal drugs. Some items may interact with your medicine. This list may not describe all possible interactions. Give your health care provider a list of all the medicines, herbs, non-prescription drugs, or dietary supplements you use. Also tell them if you smoke, drink alcohol, or  use illegal drugs. Some items may interact with your medicine. What should I watch for while using this medicine? Your condition will be monitored carefully while you are receiving this medicine. You may need blood work done while you are taking this medicine. Do not become pregnant while taking this medicine or for 4 months after stopping it. Women should inform their doctor if they wish to become pregnant or think they might be pregnant. There is a potential for serious side effects to an unborn child. Talk to your health care professional or pharmacist for more information. Do not breast-feed an infant while taking this medicine or for 4 months after the last dose. What side effects may I notice from receiving this medicine? Side effects that you should report to your doctor or health care professional as soon as possible: -allergic reactions like skin rash, itching or hives, swelling of the face, lips, or tongue -bloody or black, tarry stools -breathing problems -change in the amount of urine -changes in vision -chest pain -chills -dark urine -dizziness or feeling faint or lightheaded -fast or irregular heartbeat -fever -flushing -hair loss -muscle pain -muscle weakness -persistent headache -signs and symptoms of high blood sugar such as dizziness; dry mouth; dry skin; fruity breath; nausea; stomach pain; increased hunger or thirst; increased urination -signs and symptoms of liver injury like dark urine, light-colored stools, loss of appetite, nausea, right upper belly pain, yellowing of the eyes or skin -stomach pain -weight loss Side effects that usually do not require medical attention (Report these to your doctor or health care professional if they continue or are bothersome.):constipation -cough -diarrhea -joint pain -  tiredness This list may not describe all possible side effects. Call your doctor for medical advice about side effects. You may report side effects to FDA at  1-800-FDA-1088. Where should I keep my medicine? This drug is given in a hospital or clinic and will not be stored at home. NOTE: This sheet is a summary. It may not cover all possible information. If you have questions about this medicine, talk to your doctor, pharmacist, or health care provider.    2016, Elsevier/Gold Standard. (2014-07-27 17:24:19)  [facility] Discharge Instructions for Patients Receiving Chemotherapy  Today you received the following chemotherapy agents Keytruda.  To help prevent nausea and vomiting after your treatment, we encourage you to take your nausea medication as directed.   If you develop nausea and vomiting that is not controlled by your nausea medication, call the clinic.   BELOW ARE SYMPTOMS THAT SHOULD BE REPORTED IMMEDIATELY:  *FEVER GREATER THAN 100.5 F  *CHILLS WITH OR WITHOUT FEVER  NAUSEA AND VOMITING THAT IS NOT CONTROLLED WITH YOUR NAUSEA MEDICATION  *UNUSUAL SHORTNESS OF BREATH  *UNUSUAL BRUISING OR BLEEDING  TENDERNESS IN MOUTH AND THROAT WITH OR WITHOUT PRESENCE OF ULCERS  *URINARY PROBLEMS  *BOWEL PROBLEMS  UNUSUAL RASH Items with * indicate a potential emergency and should be followed up as soon as possible.  Feel free to call the clinic you have any questions or concerns. The clinic phone number is [phone].  Please show the CHEMO ALERT CARD at check-in to the Emergency Department and triage nurse.    

## 2016-03-22 NOTE — Progress Notes (Signed)
Introduced myself as one of the FA.  Pt inquired about the [place].  I went over what it covers and gave him an expense sheet.  Pt would like to apply so he will bring his proof of income on 03/29/16.  Pt has 2 insurances for copay assistance is not needed.  I gave him Shauna's contact info.

## 2016-03-22 NOTE — Telephone Encounter (Signed)
Spoke w/ pt in infusion room.  He says he will run out of all his medications next week and asks for refills from [facility].  He does not have PCP or cardiologist in [place] yet.  Med list reviewed w/ Dr. Alvy Bimler and she will manage pt's pain and nausea medication only.  She will give 30 day refill on all his meds today one time to give him time to get PCP and Cardiologist in [place].  Informed pt and he verbalized understanding. Rx for Oxycodone given.  All other refills sent electronically to [facility] per pt's request.

## 2016-03-29 ENCOUNTER — Ambulatory Visit (INDEPENDENT_AMBULATORY_CARE_PROVIDER_SITE_OTHER): Payer: Medicare Other | Admitting: Internal Medicine

## 2016-03-29 ENCOUNTER — Encounter: Payer: Self-pay | Admitting: Hematology and Oncology

## 2016-03-29 ENCOUNTER — Encounter: Payer: Self-pay | Admitting: Internal Medicine

## 2016-03-29 ENCOUNTER — Ambulatory Visit (INDEPENDENT_AMBULATORY_CARE_PROVIDER_SITE_OTHER)
Admission: RE | Admit: 2016-03-29 | Discharge: 2016-03-29 | Disposition: A | Discharge status: Home / Self Care | Payer: Medicare Other | Source: Ambulatory Visit | Attending: Internal Medicine | Admitting: Internal Medicine

## 2016-03-29 VITALS — BP 122/80 | HR 107 | Ht 73.0 in | Wt 162.8 lb

## 2016-03-29 DIAGNOSIS — J449 Chronic obstructive pulmonary disease, unspecified: Secondary | ICD-10-CM

## 2016-03-29 DIAGNOSIS — I209 Angina pectoris, unspecified: Secondary | ICD-10-CM | POA: Diagnosis not present

## 2016-03-29 DIAGNOSIS — J441 Chronic obstructive pulmonary disease with (acute) exacerbation: Secondary | ICD-10-CM

## 2016-03-29 DIAGNOSIS — D638 Anemia in other chronic diseases classified elsewhere: Secondary | ICD-10-CM

## 2016-03-29 MED ORDER — BUDESONIDE-FORMOTEROL FUMARATE 160-4.5 MCG/ACT IN AERO: 2.0000 | INHALATION_SPRAY | Freq: Two times a day (BID) | RESPIRATORY_TRACT | 6 refills | Status: DC

## 2016-03-29 MED ORDER — BUDESONIDE-FORMOTEROL FUMARATE 160-4.5 MCG/ACT IN AERO: 2.0000 | INHALATION_SPRAY | Freq: Two times a day (BID) | RESPIRATORY_TRACT | 0 refills | Status: DC

## 2016-03-29 NOTE — Patient Instructions (Addendum)
Plan A = Automatic = symbicort 160 Take 2 puffs first thing in am and then another 2 puffs about 12 hours later.   Work on inhaler technique:  relax and gently blow all the way out then take a nice smooth deep breath back in, triggering the inhaler at same time you start breathing in.  Hold for up to 5 seconds if you can. Blow out thru nose. Rinse and gargle with water when done     Plan B = Backup Only use your albuterol as a rescue medication to be used if you can't catch your breath by resting or doing a relaxed purse lip breathing pattern.  - The less you use it, the better it will work when you need it. - Ok to use the inhaler up to 2 puffs  every 4 hours if you must but call for appointment if use goes up over your usual need - Don't leave home without it !!  (think of it like the spare tire for your car)   Plan C = Crisis - only use your albuterol nebulizer if you first try Plan B and it fails to help > ok to use the nebulizer up to every 4 hours but if start needing it regularly call for immediate appointment   Please remember to go to the  x-ray department downstairs for your tests - we will call you with the results when they are available.     Please schedule a follow up office visit in 6 weeks, call sooner if needed

## 2016-03-29 NOTE — Progress Notes (Signed)
Subjective:    Patient ID: Zachary Cooper, male    DOB: 09/21/1950,     MRN: [id number]  HPI  47 yobm quit smoking 02/08/16 dx of copd dx 2008 by [facility] rx 02 and advair but stopped the 02 when moved to [place] 01/2016 to live with niece referred to pulmonary clinic 03/29/2016 by Dr  Alvy Bimler for copd eval -  dx is Stage IV metastatic hypopharyngeal cancer to the lungs, on palliative treatment with prev rx at [facility] originally dx 08/23/13 and His treatment course at [facility] was complicated by failure to thrive, recurrent infection with severe pneumonia, DVT related to line placement, PE, congestive heart failure, atrial fibrillation with rapid ventricular response, severe anemia requiring blood transfusions and occasional renal dysfunction    03/29/2016 1st [facility] office visit/ Ehtan Delfavero   Chief Complaint  Patient presents with  . Pulmonary Consult    Referred by Dr Alvy Bimler for COPD.   best days = 25 ft sob Hardly every cough up anything  Better while on 02 but off since moved to [place]  Sleeps poorly but not due to resp complaints Was on advair but didn't help much ? When stopped?    No obvious other patterns in day to day or daytime variabilty or assoc  cp or chest tightness,  overt sinus or hb symptoms. No unusual exp hx or h/o childhood pna/ asthma or knowledge of premature birth.  Sleeping ok without nocturnal  or early am exacerbation  of respiratory  c/o's or need for noct saba. Also denies any obvious fluctuation of symptoms with weather or environmental changes or other aggravating or alleviating factors except as outlined above   Current Medications, Allergies, Complete Past Medical History, Past Surgical History, Family History, and Social History were reviewed in [employer] record.           Review of Systems  Constitutional: Positive for unexpected weight change. Negative for fever.  HENT: Negative for congestion, dental problem, ear pain, nosebleeds,  postnasal drip, rhinorrhea, sinus pressure, sneezing, sore throat and trouble swallowing.   Eyes: Negative for redness and itching.  Respiratory: Positive for cough, shortness of breath and wheezing. Negative for chest tightness.   Cardiovascular: Negative for palpitations and leg swelling.  Gastrointestinal: Negative for nausea and vomiting.  Genitourinary: Negative for dysuria.  Musculoskeletal: Negative for joint swelling.  Skin: Negative for rash.  Neurological: Positive for dizziness and light-headedness. Negative for headaches.  Hematological: Does not bruise/bleed easily.  Psychiatric/Behavioral: Negative for dysphoric mood. The patient is not nervous/anxious.        Objective:   Physical Exam   Elderly wm much older than stated age/ sits slumped over mumbling responses   Wt Readings from Last 3 Encounters:  03/29/16 162 lb 12.8 oz (73.8 kg)  03/13/16 178 lb 14.4 oz (81.1 kg)    Vital signs reviewed -  - Note on arrival 02 sats  97% on RA     HEENT: nl   turbinates, and oropharynx. Nl external ear canals without cough reflex   NECK :  without JVD/Nodes/TM/ nl carotid upstrokes bilaterally   LUNGS: no acc muscle use,  Nl contour chest with mid bilateral exp rhonchi    CV:  RRR  no s3 or murmur or increase in P2, no edema   ABD:  soft and nontender with nl inspiratory excursion in the supine position. No bruits or organomegaly, bowel sounds nl  MS:  Nl gait/ ext warm without deformities, calf tenderness,  cyanosis or clubbing No obvious joint restrictions   SKIN: warm and dry without lesions    NEURO:  alert, approp, nl sensorium with  no motor deficits    CXR PA and Lateral:   03/29/2016 :    I personally reviewed images and agree with radiology impression as follows:    Aortic atherosclerosis. Irregular density is seen in left suprahilar region as well as posteriorly in the left lower lobe. Nodular density seen in left midlung as well. These findings may  represent neoplasm, and CT scan of the chest with contrast is recommended for further evaluation.   Labs   reviewed:      Chemistry      Component Value Date/Time   NA 135 (L) 03/22/2016 0946   K 3.6 03/22/2016 0946   CO2 27 03/22/2016 0946   BUN 8.1 03/22/2016 0946   CREATININE 0.9 03/22/2016 0946      Component Value Date/Time   CALCIUM 8.9 03/22/2016 0946   ALKPHOS 62 03/22/2016 0946   AST 16 03/22/2016 0946   ALT <6 03/22/2016 0946   BILITOT 0.51 03/22/2016 0946        Lab Results  Component Value Date   WBC 6.0 03/22/2016   HGB 8.8 (L) 03/22/2016   HCT 28.2 (L) 03/22/2016   MCV 83.7 03/22/2016   PLT 301 03/22/2016         Lab Results  Component Value Date   TSH 3.634 03/22/2016               Assessment & Plan:

## 2016-03-29 NOTE — Progress Notes (Signed)
Pt is approved for the $400 [facility] grant.  °

## 2016-03-30 NOTE — Progress Notes (Signed)
Spoke with pt and notified of results per Dr. Wert. Pt verbalized understanding and denied any questions. 

## 2016-03-31 DIAGNOSIS — D638 Anemia in other chronic diseases classified elsewhere: Secondary | ICD-10-CM | POA: Insufficient documentation

## 2016-03-31 NOTE — Assessment & Plan Note (Signed)
  Lab Results  Component Value Date   HGB 8.8 (L) 03/22/2016     Synergistic with copd in causing sob/ debilitation > f/u with oncology planned

## 2016-03-31 NOTE — Assessment & Plan Note (Addendum)
03/29/2016  After extensive coaching HFA effectiveness =    90%  - Spirometry 03/29/2016  FEV1 1.09  (32%)  Ratio 40  - 03/29/2016   Walked RA x one lap @ 185 stopped due to  Sob with sats still 90% slow pace    Since his technique with hfa rescue is so good rec maint with hfa also in form of symb 160 2bid but may need to reduce to the 80 2bid or use a spacer if irritates his upper airway which is much more problematic typically in DPIs  Note anemia also likely contributing to symptoms (see separate a/p)   Total time devoted to counseling  = 35/50m review case with pt/ discussion of options/alternatives/ personally creating written instructions  in presence of pt  then going over those specific  Instructions directly with the pt including how to use all of the meds but in particular covering each new medication in detail and the difference between the maintenance/automatic meds and the prns using an action plan format for the latter.

## 2016-04-06 ENCOUNTER — Telehealth: Payer: Self-pay | Admitting: Hematology and Oncology

## 2016-04-06 NOTE — Telephone Encounter (Signed)
FAXED PT RECORDS TO DR Norwood ID [id number]

## 2016-04-10 ENCOUNTER — Ambulatory Visit (HOSPITAL_COMMUNITY)
Admission: RE | Admit: 2016-04-10 | Discharge: 2016-04-10 | Disposition: A | Discharge status: Home / Self Care | Payer: Medicare Other | Source: Ambulatory Visit | Attending: Hematology and Oncology | Admitting: Hematology and Oncology

## 2016-04-10 ENCOUNTER — Ambulatory Visit (HOSPITAL_COMMUNITY): Payer: Medicare Other

## 2016-04-10 DIAGNOSIS — I700 Atherosclerosis of aorta: Secondary | ICD-10-CM | POA: Diagnosis not present

## 2016-04-10 DIAGNOSIS — I672 Cerebral atherosclerosis: Secondary | ICD-10-CM | POA: Diagnosis not present

## 2016-04-10 DIAGNOSIS — R911 Solitary pulmonary nodule: Secondary | ICD-10-CM | POA: Diagnosis not present

## 2016-04-10 DIAGNOSIS — C139 Malignant neoplasm of hypopharynx, unspecified: Secondary | ICD-10-CM

## 2016-04-10 DIAGNOSIS — M84411S Pathological fracture, right shoulder, sequela: Secondary | ICD-10-CM | POA: Insufficient documentation

## 2016-04-10 DIAGNOSIS — I6529 Occlusion and stenosis of unspecified carotid artery: Secondary | ICD-10-CM | POA: Insufficient documentation

## 2016-04-10 DIAGNOSIS — C7800 Secondary malignant neoplasm of unspecified lung: Secondary | ICD-10-CM | POA: Diagnosis not present

## 2016-04-12 ENCOUNTER — Other Ambulatory Visit: Payer: Medicare Other

## 2016-04-12 ENCOUNTER — Ambulatory Visit: Payer: Medicare Other

## 2016-04-12 ENCOUNTER — Ambulatory Visit: Payer: Medicare Other | Admitting: Hematology and Oncology

## 2016-04-12 ENCOUNTER — Telehealth: Payer: Self-pay | Admitting: *Deleted

## 2016-04-12 NOTE — Telephone Encounter (Signed)
Called pt regarding his appts today, he has not shown up yet.   Pt states he is at home and was unaware he had any appts today.  He says he does not have any way to get here today and asks if we can reschedule him. He can find transportation as soon as she knows his new schedule.

## 2016-04-12 NOTE — Telephone Encounter (Signed)
Informed pt appts today r/s to next week.    See Dr. Alvy Bimler on Monday 11/6 at 10 am.   Lab and Treatment will be scheduled for Wed 11/8.  Will give him schedule when he comes in on Monday.  Pt verbalized understanding.  Scheduling message sent urgent.

## 2016-04-16 ENCOUNTER — Telehealth: Payer: Self-pay | Admitting: *Deleted

## 2016-04-16 ENCOUNTER — Encounter: Payer: Self-pay | Admitting: Hematology and Oncology

## 2016-04-16 ENCOUNTER — Telehealth: Payer: Self-pay | Admitting: Hematology and Oncology

## 2016-04-16 ENCOUNTER — Ambulatory Visit (HOSPITAL_BASED_OUTPATIENT_CLINIC_OR_DEPARTMENT_OTHER): Payer: Medicare Other | Admitting: Hematology and Oncology

## 2016-04-16 DIAGNOSIS — I2782 Chronic pulmonary embolism: Secondary | ICD-10-CM

## 2016-04-16 DIAGNOSIS — G893 Neoplasm related pain (acute) (chronic): Secondary | ICD-10-CM

## 2016-04-16 DIAGNOSIS — C139 Malignant neoplasm of hypopharynx, unspecified: Secondary | ICD-10-CM | POA: Diagnosis present

## 2016-04-16 DIAGNOSIS — I878 Other specified disorders of veins: Secondary | ICD-10-CM | POA: Diagnosis not present

## 2016-04-16 DIAGNOSIS — I209 Angina pectoris, unspecified: Secondary | ICD-10-CM | POA: Diagnosis not present

## 2016-04-16 DIAGNOSIS — J449 Chronic obstructive pulmonary disease, unspecified: Secondary | ICD-10-CM

## 2016-04-16 MED ORDER — OXYCODONE HCL 10 MG PO TABS: 10.0000 mg | ORAL_TABLET | ORAL | 0 refills | Status: DC | PRN

## 2016-04-16 NOTE — Assessment & Plan Note (Signed)
The patient has past history of extensive smoking and severe emphysema. I would defer to his pulmonologist for further management

## 2016-04-16 NOTE — Assessment & Plan Note (Signed)
The patient have history of thrombosis and PE. He is on chronic anticoagulation therapy. His initial thrombotic event was precipitated by port placement and PICC line placement. He is noted to have poor venous access but the patient declined placement of PICC line or port for chemotherapy

## 2016-04-16 NOTE — Assessment & Plan Note (Signed)
He had history of DVT and PE. He is on chronic anticoagulation therapy. The patient denies any recent signs or symptoms of bleeding such as spontaneous epistaxis, hematuria or hematochezia.

## 2016-04-16 NOTE — Assessment & Plan Note (Signed)
He has chest wall pain from his disease. I started him on oxycodone 5 mg daily with no success. I plan to increase the dose 10 mg as needed every 4 hours and I will reassess pain control in the next visit. I warned him about risk of nausea and constipation.

## 2016-04-16 NOTE — Progress Notes (Signed)
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 No lymphadenopathy in the neck. A CT of the chest on 08/23/13 showed bullous emphysema and 2 nodules possibly representing metastatic disease including a 1.4cm left upper lobe nodule and a 1.4cm nodule in the right middle lobe.              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 Bilateral hypermetabolic pulmonary nodules are concerning for bilateral lung metastases.      09/14/2013 - 11/09/2013 Chemotherapy    The patient had weekly cisplatin x 3 with radiation at [facility]. Chemo was stopped due to intolerable toxicities       09/14/2013 - 10/12/2013 Radiation Therapy    He received radiation therapy at [facility]. IMRT was used to treat the primary tumor and regional lymphatics to 4600cGy in 200cGy daily fractions over 23 days followed by a boost of 2400cGy in 12 days to a total dose of 7000cGy.        09/22/2013 Procedure    He has placement of port       09/29/2013 Procedure    He has placement of feeding tube      10/12/2013 Procedure    He has removal of port      10/17/2013 Imaging    Ct neck showed thrombus is present in the jugular vein from the skull base to the brachiocephalic vein. A small venous branch or collateral also demonstrates thrombus in the anterior right neck. The SVC is patent. The right-sided  laryngeal mass is smaller with decreased but persistent right to left mass effect on the airway. Severe atherosclerotic changes at the bifurcation of the carotid arteries on the left, greater than right. In the dermis of the upper anterior right chest wall there is a small fluid and air collection measuring 2.3 x 1.1 cm.      10/17/2013 Imaging    Ct chest showed The SVC is patent however there is thrombus extending throughout the jugular vein to just below the brachiocephalic vein. There is also likely thrombus and a small peripheral vein in the anterior right neck. Several pulmonary nodules, one larger nodule appears smaller with another larger nodule stable and several new small pulmonary nodules. Follow-up recommended. New mild right lower lobe airspace disease with several nodular opacities. This may represent infection. No pleural effusion.      01/01/2014 Imaging    Ct chest showed Interval improvement in heterogeneous right lower lobe opacity, likely improving infection/aspiration. Previously visualized FDG avid left upper lobe nodule is less solid compared to most recent prior but not significantly changed in size. Previously visualized FDG avid right middle lobe nodule has increased in size compared to most recent prior, likely disease progression. Significant interval increase in size of left upper lobe nodules consistent with progressive metastatic disease. Other smaller previously visualized nodules are not visualized and were likely  inflammatory/infectious in etiology.      01/01/2014 Imaging    Ct neck showed thrombus present in the jugular vein from skull base to brachiocephalic vein, the SVC remains patent. Extensive atherosclerotic calcifications of the carotid bifurcations left greater than right the mass in the hypopharynx. Improved appearance of the hypopharyngeal mass with less edema and last less shift to the left. Less edema and fluid within the adjacent soft tissues also  demonstrated. Increased fluid in the retropharyngeal prevertebral soft tissues.      04/15/2014 Imaging    CT pulmonary arteriogram demonstrates no evidence of pulmonary embolism. Bilateral pulmonary lesions demonstrated enlarged since the prior study. A new lesion is also seen within the lingula. Findings would be typical of pulmonary metastatic lesions. Focal parenchymal infiltrate demonstrated in the right lower lobe. Left basilar atelectasis. Middle lobe infiltrate or atelectasis demonstrated with underlying bronchiectasis. Lingular infiltrate or atelectasis also      05/03/2014 Imaging    Ct chest showed interval enlargement of at least 2 pulmonary nodules as described above. Findings are suspicious for worsening disease.  Interval development of consolidation within the lateral segment right middle lobe could be related to a filling defect in a branch of the lateral segment right middle lobe bronchus. The consolidation favored to represent atelectasis. The endobronchial focus could represent mucoid impactionor possibly a fixed endobronchial lesion. Aspiration would be less likely given its location in the right middle lobe.      05/03/2014 Imaging    Ct neck showed no significant change in right hypopharyngeal mass. Stable airway asymmetry. Ill-defined soft tissue along the carotid space carotid encasement mildly improved. Multispatial soft tissue stranding/fluid has improved. Right internal jugular vein not visualized..      05/13/2014 Procedure    Tracheostomy was removed         06/16/2014 - 03/16/2015 Chemotherapy    The patient had carboplatin AUC 2 and Taxol 45 mg/m2 at [facility]       06/16/2014 Imaging    Ct chest showed stable dominant nodules from recent prior. Presumably, metastatic disease. New increased groundglass attenuation opacities, right lower lobe, centrilobular and clumped nodules, as well as a more dominant nodular opacity. While these findings could be secondary to  progressive metastatic disease, given their location and appearance, aspiration pneumonia/pneumonitis is also possible.      06/21/2014 Imaging    Ct abdomen showed gastrostomy tube is positioned within the stomach. No evidence for an acute inflammatory process within the abdomen or pelvis. No large or small bowel obstruction. No change right lower lobe airspace disease and nodular opacities      09/30/2014 Imaging    Ct chest showed interval decrease in size of metastatic pulmonary nodules. One of these nodules demonstrates new cavitation within the right upper lobe. Interval improvement of nodular opacities within the right lower lobe suggestive of a resolving aspiration.      04/01/2015 Imaging    Ct chest showed small amount of right upper lobe acute, nonocclusive pulmonary embolus. Enlarged left upper lobe pulmonary metastasis. Mixed response with resolved previous smaller nodule in the lingula. Right middle and right lower lobe appearance likely represents posttreatment cicatrization atelectasis (query if radiation therapy has been performed in this region). Worsened right infrahilar bronchial wall thickening with occlusion of a subsegmental branch. No evidence for postobstructive pneumonia. Paraseptal emphysema.      04/19/2015 - 02/29/2016 Chemotherapy    The patient had Keytruda at [facility].  He subsequently relocated to [facility]  04/29/2015 Imaging    Ct neck showed interval significant reduction in size of previous large right supraglottic mass. Airway is now widely patent at this level. No definite discrete supraglottic or pharyngeal mass demonstrated currently by CT. Left upper lobe spiculated mass. Prominent calcified atherosclerotic plaquing involving the carotid bifurcations and carotid bulbs greater on the left resulting in stenosis of the internal carotid artery origins. Consider follow-up carotid Doppler ultrasound exam for further evaluation.      07/13/2015 Imaging    Ct  neck showed expected post-treatment changes in the neck without evidence of recurrent disease in the primary site. No abnormal lymph nodes. Significant biapical bullous disease /paraseptal emphysema in the lungs, unchanged from prior. Previously seen left upper lobe nodular mass is visualized on the same-day CT chest.       07/13/2015 Imaging    Ct chest showed interval decrease in size of a spiculated left upper lobe nodule. Numerous additional nonspecific pulmonary nodules of the bilateral lungs, which are stable compared to prior examination. Clustered tree-in-bud nodules of the right lung base are likely sequelae of Aspiration. Bronchiectasis and atelectasis of the right middle lobe. Emphysema.      08/24/2015 Miscellaneous    He received IV feraheme      09/20/2015 Imaging    CT scan showed acute, comminuted, medial right clavicular fracture and marked, overlying soft tissue swelling. Mild, acute, T10 compression fracture.  The known left upper lobe carcinoma has modestly decreased in size since the 04/01/2015 comparison. Perhaps there has been a response to interim therapy. Right middle lobe and lower lobe bronchiectasis and pleuroparenchymal scarring were also present on the 04/01/2015 comparison. Confluent consolidation, however, is less striking than on the prior. Perhaps this is residua of prior pneumonia or radiation. Marked bullous emphysema is unchanged. Advanced, 3-vessel coronary artery disease. The liver is unusual in shape, but the findings are not definitive for cirrhosis. The appearance is unchanged since 08/19/2014.      09/20/2015 Imaging    Ct head showed mild patchy low-density present in the periventricular white matter. No mass, hemorrhage, or extracerebral fluid.  Mild supratentorial ventricle and sulcal prominence within normal limits.  A component of diffuse cerebellar atrophy is present. Carotid siphon and distal left vertebral vascular calcifications are present. On bone  windows, no calvarial lesion. Normal mastoid aeration. Mucosal thickening left posterior ethmoid air cells and retention cyst left maxillary sinus.      10/05/2015 Imaging    CT chest, abdomen and pelvis showed increasing size of spiculated nodule in the left upper lobe. Increasing adjacent groundglass opacities are concerning for lymphangitic spread of tumor. Additional pulmonary nodules are unchanged. Multiple mildly enlarged mesenteric lymph nodes, similar to prior. Sclerotic changes and height loss of the T10 vertebral body, new from prior. This is concerning for osseous metastatic disease. Extensive atherosclerotic disease at the origin of the celiac and throughout the proximal superior mesenteric artery.      10/05/2015 Imaging    Ct neck showed decreased supraglottic edema, with residual asymmetric fullness right greater than left. Stable post-therapeutic retropharyngeal edema. Comminuted fracture of the proximal right clavicle/clavicular head with surrounding soft tissue and pectoral stranding, new in the interval. Findings could be posttraumatic and/or pathologic.      11/16/2015 Imaging    Ct chest showed stable dominant left upper lobe nodule, and surrounding opacities which may be related to radiation. New 10-14-mm left upper lobe nodule, anterior to dominant nodule and radiation change. While this could also represent radiation  change, infection or neoplasm are in the differential diagnosis      12/27/2015 Imaging    Ct chest showed stable to slight decrease in nodular consolidative opacities, which are likely related to prior therapy. No new abnormalities      01/31/2016 Imaging    CT brain at [facility] showed no metastatic disease. No acute intracranial process.      04/11/2016 Imaging    Ct chest showed 15 x 10 mm left upper lobe nodule, mildly decreased and likely reflecting improving metastasis, with surrounding radiation changes. Additional multifocal patchy opacities in the left  lower lobe, favored to reflect radiation changes, less likely infection.      04/11/2016 Imaging    Ct neck showed regressed diffuse pharyngeal mucosal space soft tissue thickening since April 2017, likely sequelae of radiation. No residual pharyngeal mass or lymphadenopathy identified. Pathologic appearing fracture of the medial right clavicle redemonstrated, with some interval periosteal new bone formation but no solid osseous union. No osseous metastatic disease identified in the neck.       INTERVAL HISTORY: Please see below for problem oriented charting. He returns with his caregiver to review test results. He continues to have intermittent chest wall pain, not controlled with current prescription oxycodone He denies nausea. The patient denies any recent signs or symptoms of bleeding such as spontaneous epistaxis, hematuria or hematochezia. Overall, he tolerated treatment well. He has seen a cardiologist and pulmonologist since his last visit here  REVIEW OF SYSTEMS:   Constitutional: Denies fevers, chills or abnormal weight loss Eyes: Denies blurriness of vision Ears, nose, mouth, throat, and face: Denies mucositis or sore throat Respiratory: Denies cough, dyspnea or wheezes Cardiovascular: Denies palpitation, chest discomfort or lower extremity swelling Gastrointestinal:  Denies nausea, heartburn or change in bowel habits Skin: Denies abnormal skin rashes Lymphatics: Denies new lymphadenopathy or easy bruising Neurological:Denies numbness, tingling or new weaknesses Behavioral/Psych: Mood is stable, no new changes  All other systems were reviewed with the patient and are negative.  I have reviewed the past medical history, past surgical history, social history and family history with the patient and they are unchanged from previous note.  ALLERGIES:  is allergic to lisinopril.  MEDICATIONS:  Current Outpatient Prescriptions  Medication Sig Dispense Refill  . albuterol  (ACCUNEB) 1.25 MG/3ML nebulizer solution Take 3 mLs (1.25 mg total) by nebulization every 6 (six) hours as needed for wheezing or shortness of breath. 360 mL 0  . albuterol (PROVENTIL HFA;VENTOLIN HFA) 108 (90 Base) MCG/ACT inhaler Inhale 2 puffs into the lungs every 4 (four) hours as needed for wheezing or shortness of breath. 18 g 0  . amLODipine (NORVASC) 10 MG tablet Take 1 tablet (10 mg total) by mouth daily. 30 tablet 0  . budesonide-formoterol (SYMBICORT) 160-4.5 MCG/ACT inhaler Inhale 2 puffs into the lungs 2 (two) times daily. 1 Inhaler 6  . diltiazem (CARDIZEM CD) 180 MG 24 hr capsule Take 1 capsule (180 mg total) by mouth daily. 30 capsule 0  . gabapentin (NEURONTIN) 100 MG capsule Take 1 capsule (100 mg total) by mouth at bedtime. 30 capsule 0  . guaiFENesin-codeine (ROBITUSSIN AC) 100-10 MG/5ML syrup Take by mouth.    . isosorbide dinitrate (ISORDIL) 20 MG tablet Take 1 tablet (20 mg total) by mouth 2 (two) times daily. 60 tablet 0  . magnesium oxide (MAG-OX) 400 MG tablet Take 1 tablet (400 mg total) by mouth daily. 30 tablet 0  . OLANZapine (ZYPREXA) 5 MG tablet Take 1 tablet (5 mg  total) by mouth at bedtime. 30 tablet 0  . oxyCODONE 10 MG TABS Take 1 tablet (10 mg total) by mouth every 4 (four) hours as needed for severe pain. 90 tablet 0  . pantoprazole (PROTONIX) 40 MG tablet Take 1 tablet (40 mg total) by mouth daily. 30 tablet 0  . rivaroxaban (XARELTO) 20 MG TABS tablet Take 1 tablet (20 mg total) by mouth daily with supper. 30 tablet 0  . nitroGLYCERIN (NITROSTAT) 0.3 MG SL tablet Place 1 tablet (0.3 mg total) under the tongue every 5 (five) minutes as needed for chest pain. (Patient not taking: Reported on 04/16/2016) 90 tablet 12  . ondansetron (ZOFRAN-ODT) 8 MG disintegrating tablet Take 1 tablet (8 mg total) by mouth every 8 (eight) hours as needed for nausea or vomiting. (Patient not taking: Reported on 04/16/2016) 30 tablet 0  . prochlorperazine (COMPAZINE) 10 MG tablet Take  1 tablet (10 mg total) by mouth every 6 (six) hours as needed for nausea or vomiting. (Patient not taking: Reported on 04/16/2016) 30 tablet 0   No current facility-administered medications for this visit.     PHYSICAL EXAMINATION: ECOG PERFORMANCE STATUS: 1 - Symptomatic but completely ambulatory  Vitals:   04/16/16 1028  BP: 129/80  Pulse: (!) 102  Resp: 17  Temp: 97.7 F (36.5 C)   Filed Weights   04/16/16 1028  Weight: 175 lb 3.2 oz (79.5 kg)    GENERAL:alert, no distress and comfortable SKIN: skin color, texture, turgor are normal, no rashes or significant lesions EYES: normal, Conjunctiva are pink and non-injected, sclera clear OROPHARYNX:no exudate, no erythema and lips, buccal mucosa, and tongue normal  NECK: supple, thyroid normal size, non-tender, without nodularity LYMPH:  no palpable lymphadenopathy in the cervical, axillary or inguinal LUNGS: clear to auscultation and percussion with normal breathing effort HEART: regular rate & rhythm and no murmurs and no lower extremity edema ABDOMEN:abdomen soft, non-tender and normal bowel sounds Musculoskeletal:no cyanosis of digits and no clubbing  NEURO: alert & oriented x 3 with fluent speech, no focal motor/sensory deficits  LABORATORY DATA:  I have reviewed the data as listed    Component Value Date/Time   NA 135 (L) 03/22/2016 0946   K 3.6 03/22/2016 0946   CO2 27 03/22/2016 0946   GLUCOSE 114 03/22/2016 0946   BUN 8.1 03/22/2016 0946   CREATININE 0.9 03/22/2016 0946   CALCIUM 8.9 03/22/2016 0946   PROT 7.2 03/22/2016 0946   ALBUMIN 2.7 (L) 03/22/2016 0946   AST 16 03/22/2016 0946   ALT <6 03/22/2016 0946   ALKPHOS 62 03/22/2016 0946   BILITOT 0.51 03/22/2016 0946    No results found for: SPEP, UPEP  Lab Results  Component Value Date   WBC 6.0 03/22/2016   NEUTROABS 3.4 03/22/2016   HGB 8.8 (L) 03/22/2016   HCT 28.2 (L) 03/22/2016   MCV 83.7 03/22/2016   PLT 301 03/22/2016      Chemistry       Component Value Date/Time   NA 135 (L) 03/22/2016 0946   K 3.6 03/22/2016 0946   CO2 27 03/22/2016 0946   BUN 8.1 03/22/2016 0946   CREATININE 0.9 03/22/2016 0946      Component Value Date/Time   CALCIUM 8.9 03/22/2016 0946   ALKPHOS 62 03/22/2016 0946   AST 16 03/22/2016 0946   ALT <6 03/22/2016 0946   BILITOT 0.51 03/22/2016 0946       RADIOGRAPHIC STUDIES:I reviewed the recent CT scan with him I have personally  reviewed the radiological images as listed and agreed with the findings in the report.    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 He relocated to [facility] recently due to family support. His most recent CT scan last week show a positive response to treatment We will continue treatment every 3 weeks as scheduled and a plan to repeat another imaging study in 3 months, due February 2018  COPD GOLD III The patient has past history of extensive smoking and severe emphysema. I would defer to his pulmonologist for further management  Cancer associated pain He has chest wall pain from his disease. I started him on oxycodone 5 mg daily with no success. I plan to increase the dose 10 mg as needed every 4 hours and I will reassess pain control in the next visit. I warned him about risk of nausea and constipation.  Angina pectoris ([place]) The patient has significant history of coronary artery disease and atrial fibrillation in the past. He has regular angina symptoms. He is seeing a cardiologist and we will continue medical management  Poor venous access The patient have history of thrombosis and PE. He is on chronic anticoagulation therapy. His initial thrombotic event was precipitated by port placement and PICC line placement. He is noted to have poor venous access but the patient declined placement of  PICC line or port for chemotherapy  Pulmonary embolism ([place]) He had history of DVT and PE. He is on chronic anticoagulation therapy. The patient denies any recent signs or symptoms of bleeding such as spontaneous epistaxis, hematuria or hematochezia.   No orders of the defined types were placed in this encounter.  All questions were answered. The patient knows to call the clinic with any problems, questions or concerns. No barriers to learning was detected. I spent 25 minutes counseling the patient face to face. The total time spent in the appointment was 30 minutes and more than 50% was on counseling and review of test results     Heath Lark, MD 04/16/2016 1:07 PM

## 2016-04-16 NOTE — Telephone Encounter (Signed)
Per LOS I have scheduled appts and notified the scheduler 

## 2016-04-16 NOTE — Assessment & Plan Note (Signed)
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 He relocated to [facility] recently due to family support. His most recent CT scan last week show a positive response to treatment We will continue treatment every 3 weeks as scheduled and a plan to repeat another imaging study in 3 months, due February 2018

## 2016-04-16 NOTE — Assessment & Plan Note (Signed)
The patient has significant history of coronary artery disease and atrial fibrillation in the past. He has regular angina symptoms. He is seeing a cardiologist and we will continue medical management

## 2016-04-16 NOTE — Telephone Encounter (Signed)
Message sent to chemo scheduler to be added per 04/16/16 los. Appointments scheduled per 04/16/16 los. AVS report and appointment schedule given to patient, per 04/16/16 los.

## 2016-04-18 ENCOUNTER — Encounter: Payer: Self-pay | Admitting: *Deleted

## 2016-04-18 ENCOUNTER — Other Ambulatory Visit (HOSPITAL_BASED_OUTPATIENT_CLINIC_OR_DEPARTMENT_OTHER): Payer: Medicare Other

## 2016-04-18 ENCOUNTER — Ambulatory Visit (HOSPITAL_BASED_OUTPATIENT_CLINIC_OR_DEPARTMENT_OTHER): Payer: Medicare Other

## 2016-04-18 VITALS — BP 126/87 | HR 62 | Temp 97.8°F | Resp 16

## 2016-04-18 DIAGNOSIS — Z5112 Encounter for antineoplastic immunotherapy: Secondary | ICD-10-CM

## 2016-04-18 DIAGNOSIS — C7801 Secondary malignant neoplasm of right lung: Secondary | ICD-10-CM

## 2016-04-18 DIAGNOSIS — Z23 Encounter for immunization: Secondary | ICD-10-CM

## 2016-04-18 DIAGNOSIS — C7802 Secondary malignant neoplasm of left lung: Secondary | ICD-10-CM

## 2016-04-18 DIAGNOSIS — C139 Malignant neoplasm of hypopharynx, unspecified: Secondary | ICD-10-CM

## 2016-04-18 DIAGNOSIS — Z5111 Encounter for antineoplastic chemotherapy: Secondary | ICD-10-CM

## 2016-04-18 DIAGNOSIS — Z79899 Other long term (current) drug therapy: Secondary | ICD-10-CM

## 2016-04-18 DIAGNOSIS — C7800 Secondary malignant neoplasm of unspecified lung: Secondary | ICD-10-CM

## 2016-04-18 LAB — COMPREHENSIVE METABOLIC PANEL
ALT: <6 U/L (ref 0–55)
AST: 13 U/L (ref 5–34)
Albumin: 2.7 g/dL — ABNORMAL LOW (ref 3.5–5.0)
Alkaline Phosphatase: 108 U/L (ref 40–150)
Anion Gap: 10 mEq/L (ref 3–11)
BUN: 6.7 mg/dL — ABNORMAL LOW (ref 7.0–26.0)
CHLORIDE: 101 meq/L (ref 98–109)
CO2: 27 meq/L (ref 22–29)
Calcium: 8.9 mg/dL (ref 8.4–10.4)
Creatinine: 0.8 mg/dL (ref 0.7–1.3)
GLUCOSE: 114 mg/dL (ref 70–140)
POTASSIUM: 3.6 meq/L (ref 3.5–5.1)
SODIUM: 138 meq/L (ref 136–145)
TOTAL PROTEIN: 7.6 g/dL (ref 6.4–8.3)
Total Bilirubin: 1.04 mg/dL (ref 0.20–1.20)

## 2016-04-18 LAB — CBC WITH DIFFERENTIAL/PLATELET
BASO%: 0.3 % (ref 0.0–2.0)
Basophils Absolute: 0 10*3/uL (ref 0.0–0.1)
EOS ABS: 0.3 10*3/uL (ref 0.0–0.5)
EOS%: 5.3 % (ref 0.0–7.0)
HCT: 31.7 % — ABNORMAL LOW (ref 38.4–49.9)
HGB: 9.8 g/dL — ABNORMAL LOW (ref 13.0–17.1)
LYMPH%: 13.7 % — AB (ref 14.0–49.0)
MCH: 25.3 pg — AB (ref 27.2–33.4)
MCHC: 30.9 g/dL — AB (ref 32.0–36.0)
MCV: 81.9 fL (ref 79.3–98.0)
MONO#: 0.8 10*3/uL (ref 0.1–0.9)
MONO%: 12.3 % (ref 0.0–14.0)
NEUT%: 68.4 % (ref 39.0–75.0)
NEUTROS ABS: 4.2 10*3/uL (ref 1.5–6.5)
Platelets: 276 10*3/uL (ref 140–400)
RBC: 3.87 10*6/uL — AB (ref 4.20–5.82)
RDW: 17.6 % — ABNORMAL HIGH (ref 11.0–14.6)
WBC: 6.1 10*3/uL (ref 4.0–10.3)
lymph#: 0.8 10*3/uL — ABNORMAL LOW (ref 0.9–3.3)

## 2016-04-18 LAB — TSH: TSH: 1.831 m(IU)/L (ref 0.320–4.118)

## 2016-04-18 MED ORDER — HYDROMORPHONE HCL 4 MG/ML IJ SOLN
1.0000 mg | Freq: Once | INTRAMUSCULAR | Status: AC
  Administered 2016-04-18: 1 mg via INTRAVENOUS

## 2016-04-18 MED ORDER — SODIUM CHLORIDE 0.9 % IV SOLN
200.0000 mg | Freq: Once | INTRAVENOUS | Status: AC
  Administered 2016-04-18: 200 mg via INTRAVENOUS
  Filled 2016-04-18: qty 8

## 2016-04-18 MED ORDER — HYDROMORPHONE HCL 4 MG/ML IJ SOLN
INTRAMUSCULAR | Status: AC
  Filled 2016-04-18: qty 1

## 2016-04-18 MED ORDER — PNEUMOCOCCAL VAC POLYVALENT 25 MCG/0.5ML IJ INJ
0.5000 mL | INJECTION | Freq: Once | INTRAMUSCULAR | Status: AC
  Administered 2016-04-18: 0.5 mL via INTRAMUSCULAR
  Filled 2016-04-18: qty 0.5

## 2016-04-18 MED ORDER — SODIUM CHLORIDE 0.9 % IV SOLN
Freq: Once | INTRAVENOUS | Status: AC
  Administered 2016-04-18: 10:00:00 via INTRAVENOUS

## 2016-04-18 NOTE — Patient Instructions (Signed)
[facility] Discharge Instructions for Patients Receiving Chemotherapy  Today you received the following chemotherapy agents: Keytruda   To help prevent nausea and vomiting after your treatment, we encourage you to take your nausea medication as directed    If you develop nausea and vomiting that is not controlled by your nausea medication, call the clinic.   BELOW ARE SYMPTOMS THAT SHOULD BE REPORTED IMMEDIATELY:  *FEVER GREATER THAN 100.5 F  *CHILLS WITH OR WITHOUT FEVER  NAUSEA AND VOMITING THAT IS NOT CONTROLLED WITH YOUR NAUSEA MEDICATION  *UNUSUAL SHORTNESS OF BREATH  *UNUSUAL BRUISING OR BLEEDING  TENDERNESS IN MOUTH AND THROAT WITH OR WITHOUT PRESENCE OF ULCERS  *URINARY PROBLEMS  *BOWEL PROBLEMS  UNUSUAL RASH Items with * indicate a potential emergency and should be followed up as soon as possible.  Feel free to call the clinic you have any questions or concerns. The clinic phone number is [phone].  Please show the CHEMO ALERT CARD at check-in to the Emergency Department and triage nurse.   

## 2016-04-18 NOTE — Progress Notes (Signed)
Notified Dr. Alvy Bimler of irregular Heart Rate and increase in pain on left ribs/ chest wall with coughing.  Pt did not take his pain meds at home today.   Dr. Alvy Bimler says ok to treat today w/ irregular HR.  This is not new.   Order for IV dilaudid for pain now.   Informed pt and his friend, Juliann Pulse, of need to take pain meds at home to manage pain.  Call us if he needs new Rx.  Also take Miralax daily to prevent constipation from the pain medication.

## 2016-04-18 NOTE — Progress Notes (Signed)
0910: Patient stated that he is having left sided rib pain when coughing and right sided elbow pain. Patient is having a non-productive cough. Patient stated he did not take any of his home medications this morning including his pain medication. Irregular heart beat noted upon palpation. Cameo, RN notified who will notify Dr. Alvy Bimler.   1040: Per Cameo, per Dr. Alvy Bimler; okay to proceed with treatment.

## 2016-04-18 NOTE — Progress Notes (Signed)
Oncology Nurse Navigator Documentation  Met with Zachary Cooper to provide support and encouragement in Infusion where he was receiving second dose of Keytruda.  He was accompanied by a family friend. He indicated he is tolerating treatments without difficulty, is encouraged by results of recent scans. He denied any needs, understands I can be contacted.  Gayleen Orem, RN, BSN, [place] [facility] [place] [phone]

## 2016-05-10 ENCOUNTER — Encounter: Payer: Self-pay | Admitting: *Deleted

## 2016-05-10 ENCOUNTER — Telehealth: Payer: Self-pay | Admitting: Hematology and Oncology

## 2016-05-10 ENCOUNTER — Other Ambulatory Visit (HOSPITAL_BASED_OUTPATIENT_CLINIC_OR_DEPARTMENT_OTHER): Payer: Medicare Other

## 2016-05-10 ENCOUNTER — Ambulatory Visit (HOSPITAL_BASED_OUTPATIENT_CLINIC_OR_DEPARTMENT_OTHER): Payer: Medicare Other

## 2016-05-10 ENCOUNTER — Ambulatory Visit (HOSPITAL_BASED_OUTPATIENT_CLINIC_OR_DEPARTMENT_OTHER): Payer: Medicare Other | Admitting: Hematology and Oncology

## 2016-05-10 ENCOUNTER — Telehealth: Payer: Self-pay | Admitting: *Deleted

## 2016-05-10 ENCOUNTER — Encounter: Payer: Self-pay | Admitting: Hematology and Oncology

## 2016-05-10 ENCOUNTER — Other Ambulatory Visit: Payer: Self-pay | Admitting: Hematology and Oncology

## 2016-05-10 ENCOUNTER — Ambulatory Visit: Payer: Medicare Other | Admitting: Nutrition

## 2016-05-10 DIAGNOSIS — I2782 Chronic pulmonary embolism: Secondary | ICD-10-CM

## 2016-05-10 DIAGNOSIS — Z5112 Encounter for antineoplastic immunotherapy: Secondary | ICD-10-CM | POA: Diagnosis not present

## 2016-05-10 DIAGNOSIS — C139 Malignant neoplasm of hypopharynx, unspecified: Secondary | ICD-10-CM

## 2016-05-10 DIAGNOSIS — G893 Neoplasm related pain (acute) (chronic): Secondary | ICD-10-CM | POA: Diagnosis not present

## 2016-05-10 DIAGNOSIS — C7802 Secondary malignant neoplasm of left lung: Secondary | ICD-10-CM | POA: Diagnosis not present

## 2016-05-10 DIAGNOSIS — C7801 Secondary malignant neoplasm of right lung: Secondary | ICD-10-CM | POA: Diagnosis not present

## 2016-05-10 DIAGNOSIS — R5382 Chronic fatigue, unspecified: Secondary | ICD-10-CM

## 2016-05-10 DIAGNOSIS — R5381 Other malaise: Secondary | ICD-10-CM | POA: Diagnosis not present

## 2016-05-10 DIAGNOSIS — Z5111 Encounter for antineoplastic chemotherapy: Secondary | ICD-10-CM

## 2016-05-10 DIAGNOSIS — C7800 Secondary malignant neoplasm of unspecified lung: Secondary | ICD-10-CM

## 2016-05-10 DIAGNOSIS — E440 Moderate protein-calorie malnutrition: Secondary | ICD-10-CM

## 2016-05-10 LAB — COMPREHENSIVE METABOLIC PANEL
ALBUMIN: 3 g/dL — AB (ref 3.5–5.0)
ALK PHOS: 115 U/L (ref 40–150)
ALT: 11 U/L (ref 0–55)
ANION GAP: 15 meq/L — AB (ref 3–11)
AST: 45 U/L — ABNORMAL HIGH (ref 5–34)
BILIRUBIN TOTAL: 0.6 mg/dL (ref 0.20–1.20)
BUN: 9.6 mg/dL (ref 7.0–26.0)
CALCIUM: 9.3 mg/dL (ref 8.4–10.4)
CO2: 26 mEq/L (ref 22–29)
Chloride: 97 mEq/L — ABNORMAL LOW (ref 98–109)
Creatinine: 0.9 mg/dL (ref 0.7–1.3)
GLUCOSE: 73 mg/dL (ref 70–140)
POTASSIUM: 3.4 meq/L — AB (ref 3.5–5.1)
SODIUM: 138 meq/L (ref 136–145)
TOTAL PROTEIN: 8.6 g/dL — AB (ref 6.4–8.3)

## 2016-05-10 LAB — CBC WITH DIFFERENTIAL/PLATELET
BASO%: 0.5 % (ref 0.0–2.0)
BASOS ABS: 0 10*3/uL (ref 0.0–0.1)
EOS ABS: 0.3 10*3/uL (ref 0.0–0.5)
EOS%: 7.4 % — AB (ref 0.0–7.0)
HEMATOCRIT: 39.2 % (ref 38.4–49.9)
HEMOGLOBIN: 12.3 g/dL — AB (ref 13.0–17.1)
LYMPH#: 1 10*3/uL (ref 0.9–3.3)
LYMPH%: 21.7 % (ref 14.0–49.0)
MCH: 25.1 pg — AB (ref 27.2–33.4)
MCHC: 31.4 g/dL — ABNORMAL LOW (ref 32.0–36.0)
MCV: 79.8 fL (ref 79.3–98.0)
MONO#: 0.4 10*3/uL (ref 0.1–0.9)
MONO%: 9.9 % (ref 0.0–14.0)
NEUT#: 2.7 10*3/uL (ref 1.5–6.5)
NEUT%: 60.5 % (ref 39.0–75.0)
NRBC: 0 % (ref 0–0)
PLATELETS: 306 10*3/uL (ref 140–400)
RBC: 4.91 10*6/uL (ref 4.20–5.82)
RDW: 16.8 % — AB (ref 11.0–14.6)
WBC: 4.4 10*3/uL (ref 4.0–10.3)

## 2016-05-10 LAB — TSH: TSH: 2.854 m[IU]/L (ref 0.320–4.118)

## 2016-05-10 MED ORDER — SODIUM CHLORIDE 0.9 % IV SOLN
Freq: Once | INTRAVENOUS | Status: AC
  Administered 2016-05-10: 13:00:00 via INTRAVENOUS

## 2016-05-10 MED ORDER — OXYCODONE HCL 10 MG PO TABS: 10.0000 mg | ORAL_TABLET | ORAL | 0 refills | Status: DC | PRN

## 2016-05-10 MED ORDER — LIDOCAINE-PRILOCAINE 2.5-2.5 % EX CREA: 1.0000 "application " | TOPICAL_CREAM | CUTANEOUS | 3 refills | Status: DC | PRN

## 2016-05-10 MED ORDER — SODIUM CHLORIDE 0.9 % IV SOLN
200.0000 mg | Freq: Once | INTRAVENOUS | Status: AC
  Administered 2016-05-10: 200 mg via INTRAVENOUS
  Filled 2016-05-10: qty 8

## 2016-05-10 MED ORDER — PREDNISONE 10 MG PO TABS: 10.0000 mg | ORAL_TABLET | Freq: Every day | ORAL | 11 refills | Status: DC

## 2016-05-10 NOTE — Assessment & Plan Note (Signed)
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 His most recent CT scan in October 2017 showed a positive response to treatment We will continue treatment every 3 weeks as scheduled and a plan to repeat another imaging study in 3 months, due February 2018

## 2016-05-10 NOTE — Telephone Encounter (Signed)
Pt has poor veins for IV access.  He was stuck 4 times today for treatment and multiple times on his previous treatments.  Pt agreed to have Port a cath placed. He is a little hesitant due to he reports he had a PAC in the past that did not work.  He wants to discuss with his family but says he will get one.  Informed him of order placed by Dr. Alvy Bimler and to expect call from Radiology to make appt and give him instructions.  Instructed him to hold Xarelto starting today and take baby aspirin instead daily until after procedure.  Then he can stop Aspirin and resume Xarelto.  Instructed him on using EMLA cream for PAC and Rx sent to his pharmacy.   Written instructions handed to him w/ above instructions highlighted and reviewed w/ this RN.  Pt verbalized understanding.

## 2016-05-10 NOTE — Progress Notes (Signed)
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  Past medical history includes stroke, MI, Hypertension, COPD, chronic kidney disease, anemia.  Medications include Keytruda, magnesium oxide, Zofran, Compazine, and prednisone.  Labs include potassium 3.4, albumin 3.0.  Height: 6 feet 1 inch. Weight: 168.1 pounds. Usual body weight: 179 pounds in October. BMI: 22.18.  Patient is here with a family friend. Reports poor appetite and early satiety. States it is easier to drink than to eat solid foods. He enjoys ensure and other nutrition supplements. Endorses 10 pound weight loss.  Nutrition diagnosis:  Unintended weight loss related to inadequate oral intake as evidenced by 10 pound weight loss in approximately one month.  Intervention:  Patient was educated to try to eat small amounts mealtimes. Recommended patient consume Ensure Plus or equivalent 3 times a day between meals. Provided one complementary case. Educated patient on strategies for improving appetite and early satiety. Provided fact sheets and coupons. Questions were answered.  Teach back method used.  Contact information given.  Monitoring, evaluation, goals:  Patient will tolerate adequate calories and protein to minimize further weight loss.  Next visit: To be scheduled as needed.  **Disclaimer: This note was dictated with voice recognition software. Similar sounding words can inadvertently be transcribed and this note may contain transcription errors which may not have been corrected upon publication of note.**

## 2016-05-10 NOTE — Telephone Encounter (Signed)
Appointments scheduled per 11/30 LOS. Patient given AVS report and calendars with future scheduled appointments. °

## 2016-05-10 NOTE — Telephone Encounter (Signed)
Order for home PHT placed per Dr. Alvy Bimler.  Notified Manuela Schwartz at [facility] of new order.

## 2016-05-10 NOTE — Progress Notes (Signed)
Patient finished with chemotherapy. Upon discharging patient, infiltration noted. Arm is warm to touch, swelling noted. Consulted with [place],pharmacy. Patient may apply heat or ice depending on his preference. Dr. Alvy Bimler notified. 2 mL aspirated from IV site. Arm elevated. Extravasation instruction sheet given to patient. Follow up appointments given 05/11/2016 @ 1145, 120/07/2015 @ 1030, 05/17/2016 @ 0915. Patient instructed to call our office if any further questions or concerns arise. Patient verbalized understanding to above mentioned plan.

## 2016-05-10 NOTE — Assessment & Plan Note (Signed)
He has chest wall pain from his disease. His pain is under good control with current prescription oxycodone. I refill his prescription today We discussed narcotic refill policy

## 2016-05-10 NOTE — Assessment & Plan Note (Signed)
He had history of DVT and PE. He is on chronic anticoagulation therapy. The patient denies any recent signs or symptoms of bleeding such as spontaneous epistaxis, hematuria or hematochezia.

## 2016-05-10 NOTE — Assessment & Plan Note (Signed)
He has generalized deconditioning. I will refer him to get home PT set up at home

## 2016-05-10 NOTE — Patient Instructions (Addendum)
[facility] Discharge Instructions for Patients Receiving Chemotherapy   Dr. Alvy Bimler ordered a Port-a-Cath for you for easier I.V. Access.  Radiology will be calling you to set up appointment for next week.  Hold your Xarelto starting today and do not restart until after your procedure.  Meanwhile start taking a Baby Aspirin 81 mg every day instead of Xarelto.  Stop the Aspirin when you restart Xarelto.   EMLA cream (topical numbing cream) has been sent to your pharmacy.  You can apply cream at home on your [place] a Cath site before you come in for labs and treatment. ( It will numb your skin for the needle stick.)    Today you received the following chemotherapy agents :  Keytruda.  To help prevent nausea and vomiting after your treatment, we encourage you to take your nausea medication as prescribed.   If you develop nausea and vomiting that is not controlled by your nausea medication, call the clinic.   BELOW ARE SYMPTOMS THAT SHOULD BE REPORTED IMMEDIATELY:  *FEVER GREATER THAN 100.5 F  *CHILLS WITH OR WITHOUT FEVER  NAUSEA AND VOMITING THAT IS NOT CONTROLLED WITH YOUR NAUSEA MEDICATION  *UNUSUAL SHORTNESS OF BREATH  *UNUSUAL BRUISING OR BLEEDING  TENDERNESS IN MOUTH AND THROAT WITH OR WITHOUT PRESENCE OF ULCERS  *URINARY PROBLEMS  *BOWEL PROBLEMS  UNUSUAL RASH Items with * indicate a potential emergency and should be followed up as soon as possible.  Feel free to call the clinic you have any questions or concerns. The clinic phone number is [phone].  Please show the [facility] at check-in to the Emergency Department and triage nurse.

## 2016-05-10 NOTE — Progress Notes (Signed)
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 No lymphadenopathy in the neck. A CT of the chest on 08/23/13 showed bullous emphysema and 2 nodules possibly representing metastatic disease including a 1.4cm left upper lobe nodule and a 1.4cm nodule in the right middle lobe.              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 Bilateral hypermetabolic pulmonary nodules are concerning for bilateral lung metastases.      09/14/2013 - 11/09/2013 Chemotherapy    The patient had weekly cisplatin x 3 with radiation at [facility]. Chemo was stopped due to intolerable toxicities       09/14/2013 - 10/12/2013 Radiation Therapy    He received radiation therapy at [facility]. IMRT was used to treat the primary tumor and regional lymphatics to 4600cGy in 200cGy daily fractions over 23 days followed by a boost of 2400cGy in 12 days to a total dose of 7000cGy.        09/22/2013 Procedure    He has placement of port       09/29/2013 Procedure    He has placement of feeding tube      10/12/2013 Procedure    He has removal of port      10/17/2013 Imaging    Ct neck showed thrombus is present in the jugular vein from the skull base to the brachiocephalic vein. A small venous branch or collateral also demonstrates thrombus in the anterior right neck. The SVC is patent. The right-sided  laryngeal mass is smaller with decreased but persistent right to left mass effect on the airway. Severe atherosclerotic changes at the bifurcation of the carotid arteries on the left, greater than right. In the dermis of the upper anterior right chest wall there is a small fluid and air collection measuring 2.3 x 1.1 cm.      10/17/2013 Imaging    Ct chest showed The SVC is patent however there is thrombus extending throughout the jugular vein to just below the brachiocephalic vein. There is also likely thrombus and a small peripheral vein in the anterior right neck. Several pulmonary nodules, one larger nodule appears smaller with another larger nodule stable and several new small pulmonary nodules. Follow-up recommended. New mild right lower lobe airspace disease with several nodular opacities. This may represent infection. No pleural effusion.      01/01/2014 Imaging    Ct chest showed Interval improvement in heterogeneous right lower lobe opacity, likely improving infection/aspiration. Previously visualized FDG avid left upper lobe nodule is less solid compared to most recent prior but not significantly changed in size. Previously visualized FDG avid right middle lobe nodule has increased in size compared to most recent prior, likely disease progression. Significant interval increase in size of left upper lobe nodules consistent with progressive metastatic disease. Other smaller previously visualized nodules are not visualized and were likely  inflammatory/infectious in etiology.      01/01/2014 Imaging    Ct neck showed thrombus present in the jugular vein from skull base to brachiocephalic vein, the SVC remains patent. Extensive atherosclerotic calcifications of the carotid bifurcations left greater than right the mass in the hypopharynx. Improved appearance of the hypopharyngeal mass with less edema and last less shift to the left. Less edema and fluid within the adjacent soft tissues also  demonstrated. Increased fluid in the retropharyngeal prevertebral soft tissues.      04/15/2014 Imaging    CT pulmonary arteriogram demonstrates no evidence of pulmonary embolism. Bilateral pulmonary lesions demonstrated enlarged since the prior study. A new lesion is also seen within the lingula. Findings would be typical of pulmonary metastatic lesions. Focal parenchymal infiltrate demonstrated in the right lower lobe. Left basilar atelectasis. Middle lobe infiltrate or atelectasis demonstrated with underlying bronchiectasis. Lingular infiltrate or atelectasis also      05/03/2014 Imaging    Ct chest showed interval enlargement of at least 2 pulmonary nodules as described above. Findings are suspicious for worsening disease.  Interval development of consolidation within the lateral segment right middle lobe could be related to a filling defect in a branch of the lateral segment right middle lobe bronchus. The consolidation favored to represent atelectasis. The endobronchial focus could represent mucoid impactionor possibly a fixed endobronchial lesion. Aspiration would be less likely given its location in the right middle lobe.      05/03/2014 Imaging    Ct neck showed no significant change in right hypopharyngeal mass. Stable airway asymmetry. Ill-defined soft tissue along the carotid space carotid encasement mildly improved. Multispatial soft tissue stranding/fluid has improved. Right internal jugular vein not visualized..      05/13/2014 Procedure    Tracheostomy was removed         06/16/2014 - 03/16/2015 Chemotherapy    The patient had carboplatin AUC 2 and Taxol 45 mg/m2 at [facility]       06/16/2014 Imaging    Ct chest showed stable dominant nodules from recent prior. Presumably, metastatic disease. New increased groundglass attenuation opacities, right lower lobe, centrilobular and clumped nodules, as well as a more dominant nodular opacity. While these findings could be secondary to  progressive metastatic disease, given their location and appearance, aspiration pneumonia/pneumonitis is also possible.      06/21/2014 Imaging    Ct abdomen showed gastrostomy tube is positioned within the stomach. No evidence for an acute inflammatory process within the abdomen or pelvis. No large or small bowel obstruction. No change right lower lobe airspace disease and nodular opacities      09/30/2014 Imaging    Ct chest showed interval decrease in size of metastatic pulmonary nodules. One of these nodules demonstrates new cavitation within the right upper lobe. Interval improvement of nodular opacities within the right lower lobe suggestive of a resolving aspiration.      04/01/2015 Imaging    Ct chest showed small amount of right upper lobe acute, nonocclusive pulmonary embolus. Enlarged left upper lobe pulmonary metastasis. Mixed response with resolved previous smaller nodule in the lingula. Right middle and right lower lobe appearance likely represents posttreatment cicatrization atelectasis (query if radiation therapy has been performed in this region). Worsened right infrahilar bronchial wall thickening with occlusion of a subsegmental branch. No evidence for postobstructive pneumonia. Paraseptal emphysema.      04/19/2015 - 02/29/2016 Chemotherapy    The patient had Keytruda at [facility].  He subsequently relocated to [facility]  04/29/2015 Imaging    Ct neck showed interval significant reduction in size of previous large right supraglottic mass. Airway is now widely patent at this level. No definite discrete supraglottic or pharyngeal mass demonstrated currently by CT. Left upper lobe spiculated mass. Prominent calcified atherosclerotic plaquing involving the carotid bifurcations and carotid bulbs greater on the left resulting in stenosis of the internal carotid artery origins. Consider follow-up carotid Doppler ultrasound exam for further evaluation.      07/13/2015 Imaging    Ct  neck showed expected post-treatment changes in the neck without evidence of recurrent disease in the primary site. No abnormal lymph nodes. Significant biapical bullous disease /paraseptal emphysema in the lungs, unchanged from prior. Previously seen left upper lobe nodular mass is visualized on the same-day CT chest.       07/13/2015 Imaging    Ct chest showed interval decrease in size of a spiculated left upper lobe nodule. Numerous additional nonspecific pulmonary nodules of the bilateral lungs, which are stable compared to prior examination. Clustered tree-in-bud nodules of the right lung base are likely sequelae of Aspiration. Bronchiectasis and atelectasis of the right middle lobe. Emphysema.      08/24/2015 Miscellaneous    He received IV feraheme      09/20/2015 Imaging    CT scan showed acute, comminuted, medial right clavicular fracture and marked, overlying soft tissue swelling. Mild, acute, T10 compression fracture.  The known left upper lobe carcinoma has modestly decreased in size since the 04/01/2015 comparison. Perhaps there has been a response to interim therapy. Right middle lobe and lower lobe bronchiectasis and pleuroparenchymal scarring were also present on the 04/01/2015 comparison. Confluent consolidation, however, is less striking than on the prior. Perhaps this is residua of prior pneumonia or radiation. Marked bullous emphysema is unchanged. Advanced, 3-vessel coronary artery disease. The liver is unusual in shape, but the findings are not definitive for cirrhosis. The appearance is unchanged since 08/19/2014.      09/20/2015 Imaging    Ct head showed mild patchy low-density present in the periventricular white matter. No mass, hemorrhage, or extracerebral fluid.  Mild supratentorial ventricle and sulcal prominence within normal limits.  A component of diffuse cerebellar atrophy is present. Carotid siphon and distal left vertebral vascular calcifications are present. On bone  windows, no calvarial lesion. Normal mastoid aeration. Mucosal thickening left posterior ethmoid air cells and retention cyst left maxillary sinus.      10/05/2015 Imaging    CT chest, abdomen and pelvis showed increasing size of spiculated nodule in the left upper lobe. Increasing adjacent groundglass opacities are concerning for lymphangitic spread of tumor. Additional pulmonary nodules are unchanged. Multiple mildly enlarged mesenteric lymph nodes, similar to prior. Sclerotic changes and height loss of the T10 vertebral body, new from prior. This is concerning for osseous metastatic disease. Extensive atherosclerotic disease at the origin of the celiac and throughout the proximal superior mesenteric artery.      10/05/2015 Imaging    Ct neck showed decreased supraglottic edema, with residual asymmetric fullness right greater than left. Stable post-therapeutic retropharyngeal edema. Comminuted fracture of the proximal right clavicle/clavicular head with surrounding soft tissue and pectoral stranding, new in the interval. Findings could be posttraumatic and/or pathologic.      11/16/2015 Imaging    Ct chest showed stable dominant left upper lobe nodule, and surrounding opacities which may be related to radiation. New 10-14-mm left upper lobe nodule, anterior to dominant nodule and radiation change. While this could also represent radiation  change, infection or neoplasm are in the differential diagnosis      12/27/2015 Imaging    Ct chest showed stable to slight decrease in nodular consolidative opacities, which are likely related to prior therapy. No new abnormalities      01/31/2016 Imaging    CT brain at [facility] showed no metastatic disease. No acute intracranial process.      03/22/2016 -  Chemotherapy    He receives Keytruda       04/11/2016 Imaging    Ct chest showed 15 x 10 mm left upper lobe nodule, mildly decreased and likely reflecting improving metastasis, with surrounding radiation  changes. Additional multifocal patchy opacities in the left lower lobe, favored to reflect radiation changes, less likely infection.      04/11/2016 Imaging    Ct neck showed regressed diffuse pharyngeal mucosal space soft tissue thickening since April 2017, likely sequelae of radiation. No residual pharyngeal mass or lymphadenopathy identified. Pathologic appearing fracture of the medial right clavicle redemonstrated, with some interval periosteal new bone formation but no solid osseous union. No osseous metastatic disease identified in the neck.       INTERVAL HISTORY: Please see below for problem oriented charting. He is seen today before treatment. He has lost some weight due to anorexia. He continues to have persistent chest wall pain. The current prescription pain medicine is adequate to control his pain. He has mild constipation, resolved with laxative. He had some nausea vomiting recently, cause unknown. Denies recent fever or chills. He continues to have shortness of breath on minimal exertion. He complained of profound weakness home. The patient denies any recent signs or symptoms of bleeding such as spontaneous epistaxis, hematuria or hematochezia.  REVIEW OF SYSTEMS:   Constitutional: Denies fevers, chills  Eyes: Denies blurriness of vision Ears, nose, mouth, throat, and face: Denies mucositis or sore throat Respiratory: Denies cough, dyspnea or wheezes Cardiovascular: Denies palpitation, chest discomfort or lower extremity swelling Skin: Denies abnormal skin rashes Lymphatics: Denies new lymphadenopathy or easy bruising Neurological:Denies numbness, tingling or new weaknesses Behavioral/Psych: Mood is stable, no new changes  All other systems were reviewed with the patient and are negative.  I have reviewed the past medical history, past surgical history, social history and family history with the patient and they are unchanged from previous note.  ALLERGIES:  is allergic  to lisinopril.  MEDICATIONS:  Current Outpatient Prescriptions  Medication Sig Dispense Refill  . albuterol (ACCUNEB) 1.25 MG/3ML nebulizer solution Take 3 mLs (1.25 mg total) by nebulization every 6 (six) hours as needed for wheezing or shortness of breath. 360 mL 0  . albuterol (PROVENTIL HFA;VENTOLIN HFA) 108 (90 Base) MCG/ACT inhaler Inhale 2 puffs into the lungs every 4 (four) hours as needed for wheezing or shortness of breath. 18 g 0  . amLODipine (NORVASC) 10 MG tablet Take 1 tablet (10 mg total) by mouth daily. 30 tablet 0  . budesonide-formoterol (SYMBICORT) 160-4.5 MCG/ACT inhaler Inhale 2 puffs into the lungs 2 (two) times daily. 1 Inhaler 6  . diltiazem (CARDIZEM CD) 180 MG 24 hr capsule Take 1 capsule (180 mg total) by mouth daily. 30 capsule 0  . gabapentin (NEURONTIN) 100 MG capsule Take 1 capsule (100 mg total) by mouth at bedtime. 30 capsule 0  . guaiFENesin-codeine (ROBITUSSIN AC) 100-10 MG/5ML syrup Take by mouth.    . isosorbide dinitrate (ISORDIL) 20 MG tablet Take 1 tablet (20 mg total) by mouth 2 (two) times daily. 60 tablet 0  . magnesium  oxide (MAG-OX) 400 MG tablet Take 1 tablet (400 mg total) by mouth daily. 30 tablet 0  . nitroGLYCERIN (NITROSTAT) 0.3 MG SL tablet Place 1 tablet (0.3 mg total) under the tongue every 5 (five) minutes as needed for chest pain. (Patient not taking: Reported on 04/16/2016) 90 tablet 12  . OLANZapine (ZYPREXA) 5 MG tablet Take 1 tablet (5 mg total) by mouth at bedtime. 30 tablet 0  . ondansetron (ZOFRAN-ODT) 8 MG disintegrating tablet Take 1 tablet (8 mg total) by mouth every 8 (eight) hours as needed for nausea or vomiting. (Patient not taking: Reported on 04/16/2016) 30 tablet 0  . Oxycodone HCl 10 MG TABS Take 1 tablet (10 mg total) by mouth every 4 (four) hours as needed. 90 tablet 0  . pantoprazole (PROTONIX) 40 MG tablet Take 1 tablet (40 mg total) by mouth daily. 30 tablet 0  . predniSONE (DELTASONE) 10 MG tablet Take 1 tablet (10 mg  total) by mouth daily with breakfast. 30 tablet 11  . prochlorperazine (COMPAZINE) 10 MG tablet Take 1 tablet (10 mg total) by mouth every 6 (six) hours as needed for nausea or vomiting. (Patient not taking: Reported on 04/16/2016) 30 tablet 0  . rivaroxaban (XARELTO) 20 MG TABS tablet Take 1 tablet (20 mg total) by mouth daily with supper. 30 tablet 0   No current facility-administered medications for this visit.     PHYSICAL EXAMINATION: ECOG PERFORMANCE STATUS: 2 - Symptomatic, <50% confined to bed  Vitals:   05/10/16 1149  BP: 107/69  Pulse: 81  Resp: 18  Temp: 97.9 F (36.6 C)   Filed Weights   05/10/16 1149  Weight: 168 lb 1.6 oz (76.2 kg)    GENERAL:alert, no distress and comfortable SKIN: skin color, texture, turgor are normal, no rashes or significant lesions EYES: normal, Conjunctiva are pink and non-injected, sclera clear OROPHARYNX:no exudate, no erythema and lips, buccal mucosa, and tongue normal  NECK: supple, thyroid normal size, non-tender, without nodularity LYMPH:  no palpable lymphadenopathy in the cervical, axillary or inguinal LUNGS: clear to auscultation and percussion with normal breathing effort HEART: regular rate & rhythm and no murmurs and no lower extremity edema ABDOMEN:abdomen soft, non-tender and normal bowel sounds Musculoskeletal:no cyanosis of digits and no clubbing  NEURO: alert & oriented x 3 with fluent speech, no focal motor/sensory deficits  LABORATORY DATA:  I have reviewed the data as listed    Component Value Date/Time   NA 138 05/10/2016 1126   K 3.4 (L) 05/10/2016 1126   CO2 26 05/10/2016 1126   GLUCOSE 73 05/10/2016 1126   BUN 9.6 05/10/2016 1126   CREATININE 0.9 05/10/2016 1126   CALCIUM 9.3 05/10/2016 1126   PROT 8.6 (H) 05/10/2016 1126   ALBUMIN 3.0 (L) 05/10/2016 1126   AST 45 (H) 05/10/2016 1126   ALT 11 05/10/2016 1126   ALKPHOS 115 05/10/2016 1126   BILITOT 0.60 05/10/2016 1126    No results found for: SPEP,  UPEP  Lab Results  Component Value Date   WBC 4.4 05/10/2016   NEUTROABS 2.7 05/10/2016   HGB 12.3 (L) 05/10/2016   HCT 39.2 05/10/2016   MCV 79.8 05/10/2016   PLT 306 05/10/2016      Chemistry      Component Value Date/Time   NA 138 05/10/2016 1126   K 3.4 (L) 05/10/2016 1126   CO2 26 05/10/2016 1126   BUN 9.6 05/10/2016 1126   CREATININE 0.9 05/10/2016 1126      Component Value  Date/Time   CALCIUM 9.3 05/10/2016 1126   ALKPHOS 115 05/10/2016 1126   AST 45 (H) 05/10/2016 1126   ALT 11 05/10/2016 1126   BILITOT 0.60 05/10/2016 1126      ASSESSMENT & PLAN:  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 His most recent CT scan in October 2017 showed a positive response to treatment We will continue treatment every 3 weeks as scheduled and a plan to repeat another imaging study in 3 months, due February 2018  Cancer associated pain He has chest wall pain from his disease. His pain is under good control with current prescription oxycodone. I refill his prescription today We discussed narcotic refill policy  Pulmonary embolism ([place]) He had history of DVT and PE. He is on chronic anticoagulation therapy. The patient denies any recent signs or symptoms of bleeding such as spontaneous epistaxis, hematuria or hematochezia.  Protein-calorie malnutrition, moderate ([place]) He has lost some weight due to anorexia. We discussed appetite stimulant. I will start him on low-dose prednisone and will reassess next visit I will also get dietitian to see him to review his diet and recommend nutritional supplement  Physical deconditioning He has generalized deconditioning. I will refer him to get home PT set up at home   No orders of the defined types were placed in this encounter.  All questions were answered. The patient knows to call the  clinic with any problems, questions or concerns. No barriers to learning was detected. I spent 25 minutes counseling the patient face to face. The total time spent in the appointment was 40 minutes and more than 50% was on counseling and review of test results     Heath Lark, MD 05/10/2016 1:17 PM

## 2016-05-10 NOTE — Assessment & Plan Note (Addendum)
He has lost some weight due to anorexia. We discussed appetite stimulant. I will start him on low-dose prednisone and will reassess next visit I will also get dietitian to see him to review his diet and recommend nutritional supplement

## 2016-05-11 ENCOUNTER — Telehealth: Payer: Self-pay | Admitting: *Deleted

## 2016-05-11 ENCOUNTER — Ambulatory Visit: Payer: Medicare Other | Admitting: Internal Medicine

## 2016-05-11 ENCOUNTER — Ambulatory Visit (HOSPITAL_BASED_OUTPATIENT_CLINIC_OR_DEPARTMENT_OTHER): Payer: Medicare Other

## 2016-05-11 DIAGNOSIS — C139 Malignant neoplasm of hypopharynx, unspecified: Secondary | ICD-10-CM

## 2016-05-11 NOTE — Telephone Encounter (Signed)
Per LOS I have schedueld appts and notified the scheduler

## 2016-05-11 NOTE — Progress Notes (Signed)
Patient presented to infusion room for 24 hour extravasation check.  Assessed right arm.  Noted no erythema or edema.  Patient denies pain.  Distal pulse, motor, and sensory intact.  Instructed patient to RTC tomorrow for 48 hour recheck.  Verbalizes understanding.

## 2016-05-12 ENCOUNTER — Ambulatory Visit: Payer: Medicare Other

## 2016-05-12 NOTE — Progress Notes (Signed)
Pt came in for 48 hour IV site assessment. R arm unremarkable except for dry skin. No desquamation, erythema or edema noted. Pt denies pain at site. Informed pt he will not need to return for one week check as Nivolumab is neither a vesicant or irritant. Instructed him to call office if he develops any irritation at the site. Pt agreed to do so.

## 2016-05-14 NOTE — Progress Notes (Signed)
Oncology Nurse Navigator Documentation  Met with patient during Established Patient appt with Dr. Alvy Bimler.  He was accompanied by caregiver Margie Ege. He reported:  Difficulties with ADLs r/t COPD, reduced energy level.  Dr. Alvy Bimler to make referral for home PT.  Decreased appetite.  Dr. Alvy Bimler Rxed LD prednisone. He arrived with 7# weight loss since last visit.  I arranged Nutrition consult during Infusion scheduled after this appt. They understand I can be contacted with needs/concerns.  Gayleen Orem, RN, BSN, [place] Oncology Nurse [place] at [place] [phone]

## 2016-05-15 ENCOUNTER — Telehealth: Payer: Self-pay | Admitting: *Deleted

## 2016-05-15 NOTE — Telephone Encounter (Signed)
Pls proceed to authorize request/order for [facility] and also add OT consult

## 2016-05-15 NOTE — Telephone Encounter (Signed)
Orders given to [facility].

## 2016-05-15 NOTE — Telephone Encounter (Signed)
Merry Proud from [place] called requesting a verbal order to continue to follow patient. Pt is requesting an OT consult for assistance with ADLs .

## 2016-05-17 ENCOUNTER — Ambulatory Visit: Payer: Medicare Other

## 2016-05-17 NOTE — Progress Notes (Signed)
Pt here for 3rd extravasation check. Assessed right arm and the site of the previous extravasation area. No edema, erythema, pain at site. Pt did complain about right elbow pain, which he states has been painful since PICC line was removed approximately 5 months ago. He stated that pain has become worse in elbow and he's been taking prescribed pain meds. Notified Dr. Calton Dach office of the increased elbow pain.

## 2016-05-18 ENCOUNTER — Ambulatory Visit: Payer: Medicare Other | Admitting: Internal Medicine

## 2016-05-31 ENCOUNTER — Ambulatory Visit (HOSPITAL_BASED_OUTPATIENT_CLINIC_OR_DEPARTMENT_OTHER): Payer: Medicare Other

## 2016-05-31 ENCOUNTER — Encounter: Payer: Self-pay | Admitting: *Deleted

## 2016-05-31 ENCOUNTER — Ambulatory Visit (HOSPITAL_BASED_OUTPATIENT_CLINIC_OR_DEPARTMENT_OTHER): Payer: Medicare Other | Admitting: Hematology and Oncology

## 2016-05-31 ENCOUNTER — Telehealth: Payer: Self-pay | Admitting: Hematology and Oncology

## 2016-05-31 ENCOUNTER — Telehealth: Payer: Self-pay | Admitting: *Deleted

## 2016-05-31 ENCOUNTER — Other Ambulatory Visit (HOSPITAL_BASED_OUTPATIENT_CLINIC_OR_DEPARTMENT_OTHER): Payer: Medicare Other

## 2016-05-31 VITALS — HR 77 | Resp 18

## 2016-05-31 DIAGNOSIS — Z79899 Other long term (current) drug therapy: Secondary | ICD-10-CM | POA: Diagnosis not present

## 2016-05-31 DIAGNOSIS — I2699 Other pulmonary embolism without acute cor pulmonale: Secondary | ICD-10-CM

## 2016-05-31 DIAGNOSIS — Z7189 Other specified counseling: Secondary | ICD-10-CM | POA: Diagnosis not present

## 2016-05-31 DIAGNOSIS — I878 Other specified disorders of veins: Secondary | ICD-10-CM

## 2016-05-31 DIAGNOSIS — C7800 Secondary malignant neoplasm of unspecified lung: Secondary | ICD-10-CM | POA: Diagnosis not present

## 2016-05-31 DIAGNOSIS — I2782 Chronic pulmonary embolism: Secondary | ICD-10-CM

## 2016-05-31 DIAGNOSIS — Z720 Tobacco use: Secondary | ICD-10-CM | POA: Diagnosis not present

## 2016-05-31 DIAGNOSIS — Z5112 Encounter for antineoplastic immunotherapy: Secondary | ICD-10-CM

## 2016-05-31 DIAGNOSIS — E440 Moderate protein-calorie malnutrition: Secondary | ICD-10-CM

## 2016-05-31 DIAGNOSIS — C139 Malignant neoplasm of hypopharynx, unspecified: Secondary | ICD-10-CM | POA: Diagnosis not present

## 2016-05-31 DIAGNOSIS — G893 Neoplasm related pain (acute) (chronic): Secondary | ICD-10-CM | POA: Diagnosis not present

## 2016-05-31 DIAGNOSIS — Z86718 Personal history of other venous thrombosis and embolism: Secondary | ICD-10-CM | POA: Diagnosis not present

## 2016-05-31 DIAGNOSIS — C319 Malignant neoplasm of accessory sinus, unspecified: Secondary | ICD-10-CM | POA: Diagnosis not present

## 2016-05-31 DIAGNOSIS — Z5111 Encounter for antineoplastic chemotherapy: Secondary | ICD-10-CM

## 2016-05-31 LAB — COMPREHENSIVE METABOLIC PANEL
ALT: 15 U/L (ref 0–55)
ANION GAP: 16 meq/L — AB (ref 3–11)
AST: 54 U/L — ABNORMAL HIGH (ref 5–34)
Albumin: 3.3 g/dL — ABNORMAL LOW (ref 3.5–5.0)
Alkaline Phosphatase: 87 U/L (ref 40–150)
BUN: 12.9 mg/dL (ref 7.0–26.0)
CHLORIDE: 95 meq/L — AB (ref 98–109)
CO2: 27 meq/L (ref 22–29)
Calcium: 9.7 mg/dL (ref 8.4–10.4)
Creatinine: 1 mg/dL (ref 0.7–1.3)
EGFR: 89 mL/min/{1.73_m2} — AB (ref >90)
Glucose: 72 mg/dl (ref 70–140)
Potassium: 3.5 mEq/L (ref 3.5–5.1)
Sodium: 138 mEq/L (ref 136–145)
Total Bilirubin: 0.83 mg/dL (ref 0.20–1.20)
Total Protein: 9.1 g/dL — ABNORMAL HIGH (ref 6.4–8.3)

## 2016-05-31 LAB — CBC WITH DIFFERENTIAL/PLATELET
BASO%: 0.4 % (ref 0.0–2.0)
Basophils Absolute: 0 10*3/uL (ref 0.0–0.1)
EOS%: 7.8 % — ABNORMAL HIGH (ref 0.0–7.0)
Eosinophils Absolute: 0.5 10*3/uL (ref 0.0–0.5)
HCT: 39 % (ref 38.4–49.9)
HGB: 12 g/dL — ABNORMAL LOW (ref 13.0–17.1)
LYMPH%: 20.8 % (ref 14.0–49.0)
MCH: 24.4 pg — ABNORMAL LOW (ref 27.2–33.4)
MCHC: 30.8 g/dL — AB (ref 32.0–36.0)
MCV: 79.4 fL (ref 79.3–98.0)
MONO#: 0.6 10*3/uL (ref 0.1–0.9)
MONO%: 9.6 % (ref 0.0–14.0)
NEUT#: 4.1 10*3/uL (ref 1.5–6.5)
NEUT%: 61.4 % (ref 39.0–75.0)
PLATELETS: 342 10*3/uL (ref 140–400)
RBC: 4.91 10*6/uL (ref 4.20–5.82)
RDW: 16.4 % — ABNORMAL HIGH (ref 11.0–14.6)
WBC: 6.7 10*3/uL (ref 4.0–10.3)
lymph#: 1.4 10*3/uL (ref 0.9–3.3)

## 2016-05-31 LAB — TSH: TSH: 1.742 m(IU)/L (ref 0.320–4.118)

## 2016-05-31 MED ORDER — SODIUM CHLORIDE 0.9 % IV SOLN
Freq: Once | INTRAVENOUS | Status: AC
  Administered 2016-05-31: 14:00:00 via INTRAVENOUS

## 2016-05-31 MED ORDER — OXYCODONE HCL 10 MG PO TABS: 10.0000 mg | ORAL_TABLET | ORAL | 0 refills | Status: DC | PRN

## 2016-05-31 MED ORDER — PROCHLORPERAZINE MALEATE 10 MG PO TABS: 10.0000 mg | ORAL_TABLET | Freq: Four times a day (QID) | ORAL | 6 refills | Status: DC | PRN

## 2016-05-31 MED ORDER — SODIUM CHLORIDE 0.9 % IV SOLN
200.0000 mg | Freq: Once | INTRAVENOUS | Status: AC
  Administered 2016-05-31: 200 mg via INTRAVENOUS
  Filled 2016-05-31: qty 8

## 2016-05-31 NOTE — Patient Instructions (Signed)
[facility] Discharge Instructions for Patients Receiving Chemotherapy  Today you received the following chemotherapy agents: Keytruda   To help prevent nausea and vomiting after your treatment, we encourage you to take your nausea medication as directed    If you develop nausea and vomiting that is not controlled by your nausea medication, call the clinic.   BELOW ARE SYMPTOMS THAT SHOULD BE REPORTED IMMEDIATELY:  *FEVER GREATER THAN 100.5 F  *CHILLS WITH OR WITHOUT FEVER  NAUSEA AND VOMITING THAT IS NOT CONTROLLED WITH YOUR NAUSEA MEDICATION  *UNUSUAL SHORTNESS OF BREATH  *UNUSUAL BRUISING OR BLEEDING  TENDERNESS IN MOUTH AND THROAT WITH OR WITHOUT PRESENCE OF ULCERS  *URINARY PROBLEMS  *BOWEL PROBLEMS  UNUSUAL RASH Items with * indicate a potential emergency and should be followed up as soon as possible.  Feel free to call the clinic you have any questions or concerns. The clinic phone number is [phone].  Please show the CHEMO ALERT CARD at check-in to the Emergency Department and triage nurse.   

## 2016-05-31 NOTE — Telephone Encounter (Signed)
Oncology Nurse Navigator Documentation  Spoke with Oris Drone IR, requested she call patient caregiver Margie Ege to schedule PAC placement.  She voiced understanding.  Gayleen Orem, RN, BSN, [place] Oncology Nurse [place] at [place] [phone]

## 2016-05-31 NOTE — Telephone Encounter (Signed)
Lab, Chemo and follow up appointment with Dr Alvy Bimler was scheduled for 06/21/16, per 05/31/16 los. Patient will receive a copy of updated appointment schedule in infusion area, per [facility].

## 2016-05-31 NOTE — Progress Notes (Signed)
Oncology Nurse Navigator Documentation  Met with Zachary Cooper during Established Patient appt with Dr. Alvy Bimler.  He was accompanied by his brother Zachary Cooper visiting from [place], [place]; niece Zachary Cooper, caregiver Zachary Cooper.  Previous recommendation of PAC placement revisited by Dr. Alvy Bimler.  Brother/niece expressed concerns about risk of infection, interference with ADLs.   I provided [facility] educational handout, showed example from [employer]onstr[employer], provided further discussion.  He acknowledged concerns based on patient's experiences with previous PICC, agreed to support [facility] placement.  I later notified Dr. Alvy Bimler.    I reinforced Dr. Calton Dach discussion of bowel regime to avoid constipation.  Obtained case of Ensure Plus from Zachary Cooper, Nutritionist, for Zachary Cooper. I provided my contact information to brother and niece, encouraged them to call with questions/concerns.  Gayleen Orem, RN, BSN, [place] On[facility] Nurse [facility] [place] [phone]

## 2016-06-01 ENCOUNTER — Encounter: Payer: Self-pay | Admitting: Hematology and Oncology

## 2016-06-01 NOTE — Assessment & Plan Note (Signed)
He has chest wall pain from his disease. Pain has improved with increased dose oxycodone I warned him about risk of nausea and constipation.

## 2016-06-01 NOTE — Assessment & Plan Note (Signed)
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 His most recent CT scan in October 2017 showed a positive response to treatment We will continue treatment every 3 weeks as scheduled and a plan to repeat another imaging study in 3 months, due February 2018

## 2016-06-01 NOTE — Assessment & Plan Note (Signed)
He has lost some weight due to anorexia. We discussed appetite stimulant. I found out that one of the barriers to eating well is related to severe constipation. We spent a lot of time discussing about this

## 2016-06-01 NOTE — Assessment & Plan Note (Signed)
We have spent a lot of time in this visit and previous visit regarding placement of a port. The patient has been resistant to the idea due to history of PICC line infection. His venous access is so poor that he is routinely stuck many times just to get blood draw. We discussed the risk and benefit of port placement and after much discussion, ultimately he agreed to proceed with port placement in the future

## 2016-06-01 NOTE — Progress Notes (Signed)
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 No lymphadenopathy in the neck. A CT of the chest on 08/23/13 showed bullous emphysema and 2 nodules possibly representing metastatic disease including a 1.4cm left upper lobe nodule and a 1.4cm nodule in the right middle lobe.              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 Bilateral hypermetabolic pulmonary nodules are concerning for bilateral lung metastases.      09/14/2013 - 11/09/2013 Chemotherapy    The patient had weekly cisplatin x 3 with radiation at [facility]. Chemo was stopped due to intolerable toxicities       09/14/2013 - 10/12/2013 Radiation Therapy    He received radiation therapy at [facility]. IMRT was used to treat the primary tumor and regional lymphatics to 4600cGy in 200cGy daily fractions over 23 days followed by a boost of 2400cGy in 12 days to a total dose of 7000cGy.        09/22/2013 Procedure    He has placement of port       09/29/2013 Procedure    He has placement of feeding tube      10/12/2013 Procedure    He has removal of port      10/17/2013 Imaging    Ct neck showed thrombus is present in the jugular vein from the skull base to the brachiocephalic vein. A small venous branch or collateral also demonstrates thrombus in the anterior right neck. The SVC is patent. The right-sided  laryngeal mass is smaller with decreased but persistent right to left mass effect on the airway. Severe atherosclerotic changes at the bifurcation of the carotid arteries on the left, greater than right. In the dermis of the upper anterior right chest wall there is a small fluid and air collection measuring 2.3 x 1.1 cm.      10/17/2013 Imaging    Ct chest showed The SVC is patent however there is thrombus extending throughout the jugular vein to just below the brachiocephalic vein. There is also likely thrombus and a small peripheral vein in the anterior right neck. Several pulmonary nodules, one larger nodule appears smaller with another larger nodule stable and several new small pulmonary nodules. Follow-up recommended. New mild right lower lobe airspace disease with several nodular opacities. This may represent infection. No pleural effusion.      01/01/2014 Imaging    Ct chest showed Interval improvement in heterogeneous right lower lobe opacity, likely improving infection/aspiration. Previously visualized FDG avid left upper lobe nodule is less solid compared to most recent prior but not significantly changed in size. Previously visualized FDG avid right middle lobe nodule has increased in size compared to most recent prior, likely disease progression. Significant interval increase in size of left upper lobe nodules consistent with progressive metastatic disease. Other smaller previously visualized nodules are not visualized and were likely  inflammatory/infectious in etiology.      01/01/2014 Imaging    Ct neck showed thrombus present in the jugular vein from skull base to brachiocephalic vein, the SVC remains patent. Extensive atherosclerotic calcifications of the carotid bifurcations left greater than right the mass in the hypopharynx. Improved appearance of the hypopharyngeal mass with less edema and last less shift to the left. Less edema and fluid within the adjacent soft tissues also  demonstrated. Increased fluid in the retropharyngeal prevertebral soft tissues.      04/15/2014 Imaging    CT pulmonary arteriogram demonstrates no evidence of pulmonary embolism. Bilateral pulmonary lesions demonstrated enlarged since the prior study. A new lesion is also seen within the lingula. Findings would be typical of pulmonary metastatic lesions. Focal parenchymal infiltrate demonstrated in the right lower lobe. Left basilar atelectasis. Middle lobe infiltrate or atelectasis demonstrated with underlying bronchiectasis. Lingular infiltrate or atelectasis also      05/03/2014 Imaging    Ct chest showed interval enlargement of at least 2 pulmonary nodules as described above. Findings are suspicious for worsening disease.  Interval development of consolidation within the lateral segment right middle lobe could be related to a filling defect in a branch of the lateral segment right middle lobe bronchus. The consolidation favored to represent atelectasis. The endobronchial focus could represent mucoid impactionor possibly a fixed endobronchial lesion. Aspiration would be less likely given its location in the right middle lobe.      05/03/2014 Imaging    Ct neck showed no significant change in right hypopharyngeal mass. Stable airway asymmetry. Ill-defined soft tissue along the carotid space carotid encasement mildly improved. Multispatial soft tissue stranding/fluid has improved. Right internal jugular vein not visualized..      05/13/2014 Procedure    Tracheostomy was removed         06/16/2014 - 03/16/2015 Chemotherapy    The patient had carboplatin AUC 2 and Taxol 45 mg/m2 at [facility]       06/16/2014 Imaging    Ct chest showed stable dominant nodules from recent prior. Presumably, metastatic disease. New increased groundglass attenuation opacities, right lower lobe, centrilobular and clumped nodules, as well as a more dominant nodular opacity. While these findings could be secondary to  progressive metastatic disease, given their location and appearance, aspiration pneumonia/pneumonitis is also possible.      06/21/2014 Imaging    Ct abdomen showed gastrostomy tube is positioned within the stomach. No evidence for an acute inflammatory process within the abdomen or pelvis. No large or small bowel obstruction. No change right lower lobe airspace disease and nodular opacities      09/30/2014 Imaging    Ct chest showed interval decrease in size of metastatic pulmonary nodules. One of these nodules demonstrates new cavitation within the right upper lobe. Interval improvement of nodular opacities within the right lower lobe suggestive of a resolving aspiration.      04/01/2015 Imaging    Ct chest showed small amount of right upper lobe acute, nonocclusive pulmonary embolus. Enlarged left upper lobe pulmonary metastasis. Mixed response with resolved previous smaller nodule in the lingula. Right middle and right lower lobe appearance likely represents posttreatment cicatrization atelectasis (query if radiation therapy has been performed in this region). Worsened right infrahilar bronchial wall thickening with occlusion of a subsegmental branch. No evidence for postobstructive pneumonia. Paraseptal emphysema.      04/19/2015 - 02/29/2016 Chemotherapy    The patient had Keytruda at [facility].  He subsequently relocated to [facility]  04/29/2015 Imaging    Ct neck showed interval significant reduction in size of previous large right supraglottic mass. Airway is now widely patent at this level. No definite discrete supraglottic or pharyngeal mass demonstrated currently by CT. Left upper lobe spiculated mass. Prominent calcified atherosclerotic plaquing involving the carotid bifurcations and carotid bulbs greater on the left resulting in stenosis of the internal carotid artery origins. Consider follow-up carotid Doppler ultrasound exam for further evaluation.      07/13/2015 Imaging    Ct  neck showed expected post-treatment changes in the neck without evidence of recurrent disease in the primary site. No abnormal lymph nodes. Significant biapical bullous disease /paraseptal emphysema in the lungs, unchanged from prior. Previously seen left upper lobe nodular mass is visualized on the same-day CT chest.       07/13/2015 Imaging    Ct chest showed interval decrease in size of a spiculated left upper lobe nodule. Numerous additional nonspecific pulmonary nodules of the bilateral lungs, which are stable compared to prior examination. Clustered tree-in-bud nodules of the right lung base are likely sequelae of Aspiration. Bronchiectasis and atelectasis of the right middle lobe. Emphysema.      08/24/2015 Miscellaneous    He received IV feraheme      09/20/2015 Imaging    CT scan showed acute, comminuted, medial right clavicular fracture and marked, overlying soft tissue swelling. Mild, acute, T10 compression fracture.  The known left upper lobe carcinoma has modestly decreased in size since the 04/01/2015 comparison. Perhaps there has been a response to interim therapy. Right middle lobe and lower lobe bronchiectasis and pleuroparenchymal scarring were also present on the 04/01/2015 comparison. Confluent consolidation, however, is less striking than on the prior. Perhaps this is residua of prior pneumonia or radiation. Marked bullous emphysema is unchanged. Advanced, 3-vessel coronary artery disease. The liver is unusual in shape, but the findings are not definitive for cirrhosis. The appearance is unchanged since 08/19/2014.      09/20/2015 Imaging    Ct head showed mild patchy low-density present in the periventricular white matter. No mass, hemorrhage, or extracerebral fluid.  Mild supratentorial ventricle and sulcal prominence within normal limits.  A component of diffuse cerebellar atrophy is present. Carotid siphon and distal left vertebral vascular calcifications are present. On bone  windows, no calvarial lesion. Normal mastoid aeration. Mucosal thickening left posterior ethmoid air cells and retention cyst left maxillary sinus.      10/05/2015 Imaging    CT chest, abdomen and pelvis showed increasing size of spiculated nodule in the left upper lobe. Increasing adjacent groundglass opacities are concerning for lymphangitic spread of tumor. Additional pulmonary nodules are unchanged. Multiple mildly enlarged mesenteric lymph nodes, similar to prior. Sclerotic changes and height loss of the T10 vertebral body, new from prior. This is concerning for osseous metastatic disease. Extensive atherosclerotic disease at the origin of the celiac and throughout the proximal superior mesenteric artery.      10/05/2015 Imaging    Ct neck showed decreased supraglottic edema, with residual asymmetric fullness right greater than left. Stable post-therapeutic retropharyngeal edema. Comminuted fracture of the proximal right clavicle/clavicular head with surrounding soft tissue and pectoral stranding, new in the interval. Findings could be posttraumatic and/or pathologic.      11/16/2015 Imaging    Ct chest showed stable dominant left upper lobe nodule, and surrounding opacities which may be related to radiation. New 10-14-mm left upper lobe nodule, anterior to dominant nodule and radiation change. While this could also represent radiation  change, infection or neoplasm are in the differential diagnosis      12/27/2015 Imaging    Ct chest showed stable to slight decrease in nodular consolidative opacities, which are likely related to prior therapy. No new abnormalities      01/31/2016 Imaging    CT brain at [facility] showed no metastatic disease. No acute intracranial process.      03/22/2016 -  Chemotherapy    He receives Keytruda       04/11/2016 Imaging    Ct chest showed 15 x 10 mm left upper lobe nodule, mildly decreased and likely reflecting improving metastasis, with surrounding radiation  changes. Additional multifocal patchy opacities in the left lower lobe, favored to reflect radiation changes, less likely infection.      04/11/2016 Imaging    Ct neck showed regressed diffuse pharyngeal mucosal space soft tissue thickening since April 2017, likely sequelae of radiation. No residual pharyngeal mass or lymphadenopathy identified. Pathologic appearing fracture of the medial right clavicle redemonstrated, with some interval periosteal new bone formation but no solid osseous union. No osseous metastatic disease identified in the neck.       INTERVAL HISTORY: Please see below for problem oriented charting. He returns today with his brother and multiple other family members. The patient had chronic constipation and has been throwing up recently. He has lost some weight. He continues sessions of breath on minimum exertion, on oxygen therapy. He smoke and drinks sporadically. The patient denies any recent signs or symptoms of bleeding such as spontaneous epistaxis, hematuria or hematochezia.  REVIEW OF SYSTEMS:   Constitutional: Denies fevers, chills  Eyes: Denies blurriness of vision Ears, nose, mouth, throat, and face: Denies mucositis or sore throat Cardiovascular: Denies palpitation, chest discomfort or lower extremity swelling Skin: Denies abnormal skin rashes Lymphatics: Denies new lymphadenopathy or easy bruising Neurological:Denies numbness, tingling or new weaknesses Behavioral/Psych: Mood is stable, no new changes  All other systems were reviewed with the patient and are negative.  I have reviewed the past medical history, past surgical history, social history and family history with the patient and they are unchanged from previous note.  ALLERGIES:  is allergic to lisinopril.  MEDICATIONS:  Current Outpatient Prescriptions  Medication Sig Dispense Refill  . albuterol (ACCUNEB) 1.25 MG/3ML nebulizer solution Take 3 mLs (1.25 mg total) by nebulization every 6  (six) hours as needed for wheezing or shortness of breath. 360 mL 0  . albuterol (PROVENTIL HFA;VENTOLIN HFA) 108 (90 Base) MCG/ACT inhaler Inhale 2 puffs into the lungs every 4 (four) hours as needed for wheezing or shortness of breath. 18 g 0  . budesonide-formoterol (SYMBICORT) 160-4.5 MCG/ACT inhaler Inhale 2 puffs into the lungs 2 (two) times daily. 1 Inhaler 6  . diltiazem (CARDIZEM CD) 180 MG 24 hr capsule Take 1 capsule (180 mg total) by mouth daily. 30 capsule 0  . gabapentin (NEURONTIN) 100 MG capsule Take 1 capsule (100 mg total) by mouth at bedtime. 30 capsule 0  . guaiFENesin-codeine (ROBITUSSIN AC) 100-10 MG/5ML syrup Take by mouth.    . isosorbide dinitrate (ISORDIL) 20 MG tablet Take 1 tablet (20 mg total) by mouth 2 (two) times daily. 60 tablet 0  . lidocaine-prilocaine (EMLA) cream Apply 1 application topically as needed. Apply to [facility] a Cath site at least one hour prior to needle stick 30 g 3  . magnesium oxide (MAG-OX) 400 MG tablet Take 1 tablet (400 mg total) by mouth daily. 30 tablet 0  . nitroGLYCERIN (NITROSTAT) 0.3 MG  SL tablet Place 1 tablet (0.3 mg total) under the tongue every 5 (five) minutes as needed for chest pain. (Patient not taking: Reported on 04/16/2016) 90 tablet 12  . OLANZapine (ZYPREXA) 5 MG tablet Take 1 tablet (5 mg total) by mouth at bedtime. 30 tablet 0  . ondansetron (ZOFRAN-ODT) 8 MG disintegrating tablet Take 1 tablet (8 mg total) by mouth every 8 (eight) hours as needed for nausea or vomiting. (Patient not taking: Reported on 04/16/2016) 30 tablet 0  . Oxycodone HCl 10 MG TABS Take 1 tablet (10 mg total) by mouth every 4 (four) hours as needed. 90 tablet 0  . pantoprazole (PROTONIX) 40 MG tablet Take 1 tablet (40 mg total) by mouth daily. 30 tablet 0  . predniSONE (DELTASONE) 10 MG tablet Take 1 tablet (10 mg total) by mouth daily with breakfast. 30 tablet 11  . prochlorperazine (COMPAZINE) 10 MG tablet Take 1 tablet (10 mg total) by mouth every 6 (six)  hours as needed for nausea or vomiting. 90 tablet 6  . rivaroxaban (XARELTO) 20 MG TABS tablet Take 1 tablet (20 mg total) by mouth daily with supper. 30 tablet 0   No current facility-administered medications for this visit.     PHYSICAL EXAMINATION: ECOG PERFORMANCE STATUS: 1 - Symptomatic but completely ambulatory  Vitals:   05/31/16 1128  BP: 110/84  Pulse: (!) 152  Resp: 18  Temp: 97.5 F (36.4 C)   Filed Weights   05/31/16 1128  Weight: 161 lb 6.4 oz (73.2 kg)    GENERAL:alert, no distress and comfortable. He is wearing oxygen via nasal cannula SKIN: skin color, texture, turgor are normal, no rashes or significant lesions EYES: normal, Conjunctiva are pink and non-injected, sclera clear OROPHARYNX:no exudate, no erythema and lips, buccal mucosa, and tongue normal  NECK: supple, thyroid normal size, non-tender, without nodularity LYMPH:  no palpable lymphadenopathy in the cervical, axillary or inguinal LUNGS: clear to auscultation and percussion with normal breathing effort HEART: regular rate & rhythm and no murmurs and no lower extremity edema ABDOMEN:abdomen soft, non-tender and normal bowel sounds Musculoskeletal:no cyanosis of digits and no clubbing  NEURO: alert & oriented x 3 with fluent speech, no focal motor/sensory deficits  LABORATORY DATA:  I have reviewed the data as listed    Component Value Date/Time   NA 138 05/31/2016 1059   K 3.5 05/31/2016 1059   CO2 27 05/31/2016 1059   GLUCOSE 72 05/31/2016 1059   BUN 12.9 05/31/2016 1059   CREATININE 1.0 05/31/2016 1059   CALCIUM 9.7 05/31/2016 1059   PROT 9.1 (H) 05/31/2016 1059   ALBUMIN 3.3 (L) 05/31/2016 1059   AST 54 (H) 05/31/2016 1059   ALT 15 05/31/2016 1059   ALKPHOS 87 05/31/2016 1059   BILITOT 0.83 05/31/2016 1059    No results found for: SPEP, UPEP  Lab Results  Component Value Date   WBC 6.7 05/31/2016   NEUTROABS 4.1 05/31/2016   HGB 12.0 (L) 05/31/2016   HCT 39.0 05/31/2016   MCV  79.4 05/31/2016   PLT 342 05/31/2016      Chemistry      Component Value Date/Time   NA 138 05/31/2016 1059   K 3.5 05/31/2016 1059   CO2 27 05/31/2016 1059   BUN 12.9 05/31/2016 1059   CREATININE 1.0 05/31/2016 1059      Component Value Date/Time   CALCIUM 9.7 05/31/2016 1059   ALKPHOS 87 05/31/2016 1059   AST 54 (H) 05/31/2016 1059   ALT 15  05/31/2016 1059   BILITOT 0.83 05/31/2016 1059     ASSESSMENT & PLAN:  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 His most recent CT scan in October 2017 showed a positive response to treatment We will continue treatment every 3 weeks as scheduled and a plan to repeat another imaging study in 3 months, due February 2018   Cancer associated pain He has chest wall pain from his disease. Pain has improved with increased dose oxycodone I warned him about risk of nausea and constipation.  Poor venous access We have spent a lot of time in this visit and previous visit regarding placement of a port. The patient has been resistant to the idea due to history of PICC line infection. His venous access is so poor that he is routinely stuck many times just to get blood draw. We discussed the risk and benefit of port placement and after much discussion, ultimately he agreed to proceed with port placement in the future  Pulmonary embolism ([place]) He had history of DVT and PE. He is on chronic anticoagulation therapy. The patient denies any recent signs or symptoms of bleeding such as spontaneous epistaxis, hematuria or hematochezia. His anticoagulation treatment needs to be stopped 48 hours before port placement  Protein-calorie malnutrition, moderate ([place]) He has lost some weight due to anorexia. We discussed appetite stimulant. I found out that one of the barriers to eating well is related to severe  constipation. We spent a lot of time discussing about this  Goals of care, counseling/discussion We have extensive discussion about goals of care in the presence of family members. His twin brother is his dedicated medical power of attorney.    No orders of the defined types were placed in this encounter.  All questions were answered. The patient knows to call the clinic with any problems, questions or concerns. No barriers to learning was detected. I spent 45 minutes counseling the patient face to face. The total time spent in the appointment was 60 minutes and more than 50% was on counseling and review of test results     Heath Lark, MD 06/01/2016 1:52 PM

## 2016-06-01 NOTE — Assessment & Plan Note (Signed)
We have extensive discussion about goals of care in the presence of family members. His twin brother is his dedicated medical power of attorney.

## 2016-06-01 NOTE — Assessment & Plan Note (Signed)
He had history of DVT and PE. He is on chronic anticoagulation therapy. The patient denies any recent signs or symptoms of bleeding such as spontaneous epistaxis, hematuria or hematochezia. His anticoagulation treatment needs to be stopped 48 hours before port placement

## 2016-06-12 ENCOUNTER — Other Ambulatory Visit: Payer: Self-pay | Admitting: General Surgery

## 2016-06-13 ENCOUNTER — Other Ambulatory Visit: Payer: Self-pay | Admitting: Hematology and Oncology

## 2016-06-13 ENCOUNTER — Ambulatory Visit (HOSPITAL_COMMUNITY)
Admission: RE | Admit: 2016-06-13 | Discharge: 2016-06-13 | Disposition: A | Discharge status: Home / Self Care | Payer: Medicare Other | Source: Ambulatory Visit | Attending: Hematology and Oncology | Admitting: Hematology and Oncology

## 2016-06-13 ENCOUNTER — Encounter (HOSPITAL_COMMUNITY): Payer: Self-pay

## 2016-06-13 DIAGNOSIS — Z87891 Personal history of nicotine dependence: Secondary | ICD-10-CM | POA: Insufficient documentation

## 2016-06-13 DIAGNOSIS — Z86718 Personal history of other venous thrombosis and embolism: Secondary | ICD-10-CM | POA: Diagnosis not present

## 2016-06-13 DIAGNOSIS — Z86711 Personal history of pulmonary embolism: Secondary | ICD-10-CM | POA: Insufficient documentation

## 2016-06-13 DIAGNOSIS — I509 Heart failure, unspecified: Secondary | ICD-10-CM | POA: Diagnosis not present

## 2016-06-13 DIAGNOSIS — R569 Unspecified convulsions: Secondary | ICD-10-CM | POA: Insufficient documentation

## 2016-06-13 DIAGNOSIS — Z7901 Long term (current) use of anticoagulants: Secondary | ICD-10-CM | POA: Insufficient documentation

## 2016-06-13 DIAGNOSIS — N184 Chronic kidney disease, stage 4 (severe): Secondary | ICD-10-CM | POA: Insufficient documentation

## 2016-06-13 DIAGNOSIS — I130 Hypertensive heart and chronic kidney disease with heart failure and stage 1 through stage 4 chronic kidney disease, or unspecified chronic kidney disease: Secondary | ICD-10-CM | POA: Diagnosis not present

## 2016-06-13 DIAGNOSIS — Z923 Personal history of irradiation: Secondary | ICD-10-CM | POA: Diagnosis not present

## 2016-06-13 DIAGNOSIS — C7800 Secondary malignant neoplasm of unspecified lung: Secondary | ICD-10-CM

## 2016-06-13 DIAGNOSIS — I252 Old myocardial infarction: Secondary | ICD-10-CM | POA: Diagnosis not present

## 2016-06-13 DIAGNOSIS — Z8673 Personal history of transient ischemic attack (TIA), and cerebral infarction without residual deficits: Secondary | ICD-10-CM | POA: Diagnosis not present

## 2016-06-13 DIAGNOSIS — D631 Anemia in chronic kidney disease: Secondary | ICD-10-CM | POA: Diagnosis not present

## 2016-06-13 DIAGNOSIS — C139 Malignant neoplasm of hypopharynx, unspecified: Secondary | ICD-10-CM | POA: Diagnosis present

## 2016-06-13 DIAGNOSIS — I4891 Unspecified atrial fibrillation: Secondary | ICD-10-CM | POA: Insufficient documentation

## 2016-06-13 DIAGNOSIS — J449 Chronic obstructive pulmonary disease, unspecified: Secondary | ICD-10-CM | POA: Diagnosis not present

## 2016-06-13 HISTORY — PX: IR GENERIC HISTORICAL: IMG1180011

## 2016-06-13 LAB — BASIC METABOLIC PANEL
ANION GAP: 10 (ref 5–15)
BUN: 12 mg/dL (ref 6–20)
CALCIUM: 9 mg/dL (ref 8.9–10.3)
CO2: 25 mmol/L (ref 22–32)
Chloride: 99 mmol/L — ABNORMAL LOW (ref 101–111)
Creatinine, Ser: 0.92 mg/dL (ref 0.61–1.24)
GLUCOSE: 84 mg/dL (ref 65–99)
Potassium: 4.1 mmol/L (ref 3.5–5.1)
SODIUM: 134 mmol/L — AB (ref 135–145)

## 2016-06-13 LAB — CBC WITH DIFFERENTIAL/PLATELET
BASOS ABS: 0 10*3/uL (ref 0.0–0.1)
BASOS PCT: 0 %
Eosinophils Absolute: 0.2 10*3/uL (ref 0.0–0.7)
Eosinophils Relative: 5 %
HEMATOCRIT: 37 % — AB (ref 39.0–52.0)
Hemoglobin: 11.4 g/dL — ABNORMAL LOW (ref 13.0–17.0)
Lymphocytes Relative: 30 %
Lymphs Abs: 1.6 10*3/uL (ref 0.7–4.0)
MCH: 24.5 pg — ABNORMAL LOW (ref 26.0–34.0)
MCHC: 30.8 g/dL (ref 30.0–36.0)
MCV: 79.6 fL (ref 78.0–100.0)
MONO ABS: 0.5 10*3/uL (ref 0.1–1.0)
Monocytes Relative: 9 %
NEUTROS ABS: 2.9 10*3/uL (ref 1.7–7.7)
Neutrophils Relative %: 56 %
PLATELETS: 308 10*3/uL (ref 150–400)
RBC: 4.65 MIL/uL (ref 4.22–5.81)
RDW: 16.7 % — AB (ref 11.5–15.5)
WBC: 5.2 10*3/uL (ref 4.0–10.5)

## 2016-06-13 LAB — PROTIME-INR
INR: 0.96
Prothrombin Time: 12.7 seconds (ref 11.4–15.2)

## 2016-06-13 MED ORDER — SODIUM CHLORIDE 0.9 % IV SOLN
INTRAVENOUS | Status: DC
  Administered 2016-06-13: 10:00:00 via INTRAVENOUS

## 2016-06-13 MED ORDER — NALOXONE HCL 0.4 MG/ML IJ SOLN
INTRAMUSCULAR | Status: AC
  Filled 2016-06-13: qty 1

## 2016-06-13 MED ORDER — LIDOCAINE HCL 1 % IJ SOLN
INTRAMUSCULAR | Status: AC
  Filled 2016-06-13: qty 20

## 2016-06-13 MED ORDER — IOPAMIDOL (ISOVUE-300) INJECTION 61%
INTRAVENOUS | Status: AC
  Filled 2016-06-13: qty 50

## 2016-06-13 MED ORDER — FENTANYL CITRATE (PF) 100 MCG/2ML IJ SOLN
INTRAMUSCULAR | Status: AC
  Filled 2016-06-13: qty 4

## 2016-06-13 MED ORDER — LIDOCAINE HCL 1 % IJ SOLN
INTRAMUSCULAR | Status: AC | PRN
  Administered 2016-06-13 (×2): 10 mL

## 2016-06-13 MED ORDER — FLUMAZENIL 0.5 MG/5ML IV SOLN
INTRAVENOUS | Status: AC
  Filled 2016-06-13: qty 5

## 2016-06-13 MED ORDER — IOPAMIDOL (ISOVUE-300) INJECTION 61%
10.0000 mL | Freq: Once | INTRAVENOUS | Status: AC | PRN
  Administered 2016-06-13: 1 mL via INTRAVENOUS

## 2016-06-13 MED ORDER — HEPARIN SOD (PORK) LOCK FLUSH 100 UNIT/ML IV SOLN
INTRAVENOUS | Status: AC
  Filled 2016-06-13: qty 5

## 2016-06-13 MED ORDER — FENTANYL CITRATE (PF) 100 MCG/2ML IJ SOLN
INTRAMUSCULAR | Status: AC | PRN
  Administered 2016-06-13: 25 ug via INTRAVENOUS
  Administered 2016-06-13 (×3): 50 ug via INTRAVENOUS

## 2016-06-13 MED ORDER — CEFAZOLIN SODIUM-DEXTROSE 2-4 GM/100ML-% IV SOLN
2.0000 g | INTRAVENOUS | Status: AC
  Administered 2016-06-13: 2 g via INTRAVENOUS
  Filled 2016-06-13: qty 100

## 2016-06-13 MED ORDER — MIDAZOLAM HCL 2 MG/2ML IJ SOLN
INTRAMUSCULAR | Status: AC
  Filled 2016-06-13: qty 4

## 2016-06-13 MED ORDER — LIDOCAINE-EPINEPHRINE (PF) 2 %-1:200000 IJ SOLN
INTRAMUSCULAR | Status: AC | PRN
  Administered 2016-06-13: 10 mL

## 2016-06-13 MED ORDER — MIDAZOLAM HCL 2 MG/2ML IJ SOLN
INTRAMUSCULAR | Status: AC | PRN
  Administered 2016-06-13 (×3): 1 mg via INTRAVENOUS
  Administered 2016-06-13: 0.5 mg via INTRAVENOUS

## 2016-06-13 MED ORDER — LIDOCAINE-EPINEPHRINE (PF) 2 %-1:200000 IJ SOLN
INTRAMUSCULAR | Status: AC
  Filled 2016-06-13: qty 20

## 2016-06-13 NOTE — Procedures (Signed)
Successful placement of left jugular portacath.  Tip at SVC/RA junction.  Minimal blood loss.  No immediate complication.

## 2016-06-13 NOTE — Consult Note (Signed)
Chief Complaint: Patient was seen in consultation today for port a cath placement  Referring Physician(s): Gorsuch,Ni  Supervising Physician: Markus Daft  Patient Status: [facility] - Out-pt  History of Present Illness: Zachary Cooper is a 66 y.o. 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  Past Medical History:  Diagnosis Date  . Anemia   . Blood transfusion without reported diagnosis   . Cancer ([place])   . CHF (congestive heart failure) ([place])   . Chronic kidney disease   . Clotting disorder ([place])   . COPD (chronic obstructive pulmonary disease) ([place])   . Hypertension   . Myocardial infarction   . Seizures ([place])   . Stroke [facility])     Past Surgical History:  Procedure Laterality Date  . GASTROSTOMY W/ FEEDING TUBE    . PICC LINE REMOVAL ([place] [facility])    . PORT-A-CATH REMOVAL    . TRACHEOSTOMY      Allergies: Lisinopril  Medications: Prior to Admission medications   Medication Sig Start Date End Date Taking? Authorizing Provider  albuterol (ACCUNEB) 1.25 MG/3ML nebulizer solution Take 3 mLs (1.25 mg total) by nebulization every 6 (six) hours as needed for wheezing or shortness of breath. 03/22/16   Heath Lark, MD  albuterol (PROVENTIL HFA;VENTOLIN HFA) 108 (90 Base) MCG/ACT inhaler Inhale 2 puffs into the lungs every 4 (four) hours as needed for wheezing or shortness of breath. 03/22/16   Heath Lark, MD  budesonide-formoterol (SYMBICORT) 160-4.5 MCG/ACT inhaler Inhale 2 puffs into the lungs 2 (two) times daily. 03/29/16   Tanda Rockers, MD  diltiazem (CARDIZEM CD) 180 MG 24 hr capsule Take 1 capsule (180 mg total) by mouth daily. 03/22/16 03/22/17  Heath Lark, MD  gabapentin (NEURONTIN) 100 MG capsule Take 1 capsule (100 mg total) by mouth at bedtime. 03/22/16   Heath Lark, MD  guaiFENesin-codeine (ROBITUSSIN AC) 100-10 MG/5ML syrup Take by mouth. 02/29/16   Historical Provider, MD  isosorbide dinitrate (ISORDIL) 20 MG tablet Take 1 tablet (20 mg total) by mouth 2 (two) times daily. 03/22/16   Heath Lark, MD  lidocaine-prilocaine (EMLA) cream Apply 1 application topically as needed. Apply to [facility] a Cath site at least one hour prior to needle stick 05/10/16   Heath Lark, MD  magnesium oxide (MAG-OX) 400 MG tablet Take 1 tablet (400 mg total) by mouth daily. 03/22/16   Heath Lark, MD  nitroGLYCERIN (NITROSTAT) 0.3 MG SL tablet Place 1 tablet (0.3 mg total) under the tongue every 5 (five) minutes as needed for chest pain. Patient not taking: Reported on 04/16/2016 03/13/16   Heath Lark, MD  OLANZapine (ZYPREXA) 5 MG tablet Take 1 tablet (5 mg total) by mouth at bedtime. 03/22/16   Heath Lark, MD  ondansetron (ZOFRAN-ODT) 8 MG disintegrating tablet Take 1 tablet (8 mg total) by mouth every 8 (eight) hours as needed for nausea or vomiting. Patient not taking: Reported on 04/16/2016 03/22/16   Heath Lark, MD  Oxycodone HCl 10 MG TABS Take 1 tablet (10 mg total) by mouth every 4 (four) hours as needed. 05/31/16   Heath Lark, MD  pantoprazole (PROTONIX) 40 MG tablet Take 1 tablet (40 mg total) by mouth daily. 03/22/16   Heath Lark, MD  predniSONE (DELTASONE)  10 MG tablet Take 1 tablet (10 mg total) by mouth daily with breakfast. 05/10/16   Heath Lark, MD  prochlorperazine (COMPAZINE) 10 MG tablet Take 1 tablet (10 mg total) by mouth every 6 (six) hours as needed for nausea or vomiting. 05/31/16   Heath Lark, MD  rivaroxaban (XARELTO) 20 MG TABS tablet Take 1 tablet (20 mg total) by mouth daily with supper. 03/22/16   Heath Lark, MD     No family history on file.  Social History   Social History  . Marital status: Single    Spouse name: N/A  . Number  of children: 2  . Years of education: N/A   Occupational History  . unemployed    Social History Main Topics  . Smoking status: Former Smoker    Packs/day: 1.00    Years: 35.00    Types: Cigarettes    Quit date: 02/08/2016  . Smokeless tobacco: Never Used  . Alcohol use 3.6 oz/week    6 Cans of beer per week  . Drug use: No  . Sexual activity: Not on file   Other Topics Concern  . Not on file   Social History Narrative   Judeen Hammans (brother's daughter) niece MPOA lives with patient      Review of Systems Denies fever, headache, chest pain, abdominal/back pain, nausea, vomiting or abnormal bleeding. He does have some dyspnea with exertion, dysphagia.  Vital Signs: BP (!) 124/94 (BP Location: Left Arm)   Pulse (!) 121   Resp 18   SpO2 98%   Physical Exam  Patient awake alert.Chest with distant breath sounds bilaterally. Heart with irreg rhythm, nl rate. Abdomen soft, positive bowel sounds, nontender. Lower extremities -no edema. Mallampati Score:     Imaging: No results found.  Labs:  CBC:  Recent Labs  03/22/16 0946 04/18/16 0846 05/10/16 1126 05/31/16 1059  WBC 6.0 6.1 4.4 6.7  HGB 8.8* 9.8* 12.3* 12.0*  HCT 28.2* 31.7* 39.2 39.0  PLT 301 276 306 342    COAGS: No results for input(s): INR, APTT in the last 8760 hours.  BMP:  Recent Labs  03/22/16 0946 04/18/16 0846 05/10/16 1126 05/31/16 1059  NA 135* 138 138 138  K 3.6 3.6 3.4* 3.5  CO2 27 27 26 27   GLUCOSE 114 114 73 72  BUN 8.1 6.7* 9.6 12.9  CALCIUM 8.9 8.9 9.3 9.7  CREATININE 0.9 0.8 0.9 1.0    LIVER FUNCTION TESTS:  Recent Labs  03/22/16 0946 04/18/16 0846 05/10/16 1126 05/31/16 1059  BILITOT 0.51 1.04 0.60 0.83  AST 16 13 45* 54*  ALT <6 6 11 15   ALKPHOS 62 108 115 87  PROT 7.2 7.6 8.6* 9.1*  ALBUMIN 2.7* 2.7* 3.0* 3.3*    TUMOR MARKERS: No results for input(s): AFPTM, CEA, CA199, CHROMGRNA in the last 8760 hours.  Assessment and Plan: 66 y.o. 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 He has poor venous access and presents today for new Port-A-Cath placement. A fib noted on EKG today. Pt states he has hx of afib and is followed by Dr. Montez Morita. Will notify their office of plans for port and current hold on xarelto. Risks and benefits discussed with the patient/family including, but not limited to  bleeding, infection, pneumothorax, or fibrin sheath development and need for additional procedures.All of the patient's questions were answered, patient is agreeable to proceed.Consent signed and in chart.     Thank you for this interesting consult.  I greatly enjoyed meeting Eliana Friedel and look forward to participating in their care.  A copy of this report was sent to the requesting provider on this date.  Electronically Signed: D. Rowe Robert 06/13/2016, 9:39 AM   I spent a total of 25 minutes  in face to face in clinical consultation, greater than 50% of which was counseling/coordinating care for port a cath placement

## 2016-06-13 NOTE — Discharge Instructions (Signed)
Implanted Port Home Guide °An implanted port is a type of central line that is placed under the skin. Central lines are used to provide IV access when treatment or nutrition needs to be given through a person's veins. Implanted ports are used for long-term IV access. An implanted port may be placed because:  °· You need IV medicine that would be irritating to the small veins in your hands or arms.   °· You need long-term IV medicines, such as antibiotics.   °· You need IV nutrition for a long period.   °· You need frequent blood draws for lab tests.   °· You need dialysis.   °Implanted ports are usually placed in the chest area, but they can also be placed in the upper arm, the abdomen, or the leg. An implanted port has two main parts:  °· Reservoir. The reservoir is round and will appear as a small, raised area under your skin. The reservoir is the part where a needle is inserted to give medicines or draw blood.   °· Catheter. The catheter is a thin, flexible tube that extends from the reservoir. The catheter is placed into a large vein. Medicine that is inserted into the reservoir goes into the catheter and then into the vein.   °HOW WILL I CARE FOR MY INCISION SITE? °Do not get the incision site wet. Bathe or shower as directed by your health care provider.  °HOW IS MY PORT ACCESSED? °Special steps must be taken to access the port:  °· Before the port is accessed, a numbing cream can be placed on the skin. This helps numb the skin over the port site.   °· Your health care provider uses a sterile technique to access the port. °¨ Your health care provider must put on a mask and sterile gloves. °¨ The skin over your port is cleaned carefully with an antiseptic and allowed to dry. °¨ The port is gently pinched between sterile gloves, and a needle is inserted into the port. °· Only "non-coring" port needles should be used to access the port. Once the port is accessed, a blood return should be checked. This helps  ensure that the port is in the vein and is not clogged.   °· If your port needs to remain accessed for a constant infusion, a clear (transparent) bandage will be placed over the needle site. The bandage and needle will need to be changed every week, or as directed by your health care provider.   °· Keep the bandage covering the needle clean and dry. Do not get it wet. Follow your health care provider's instructions on how to take a shower or bath while the port is accessed.   °· If your port does not need to stay accessed, no bandage is needed over the port.   °WHAT IS FLUSHING? °Flushing helps keep the port from getting clogged. Follow your health care provider's instructions on how and when to flush the port. Ports are usually flushed with saline solution or a medicine called heparin. The need for flushing will depend on how the port is used.  °· If the port is used for intermittent medicines or blood draws, the port will need to be flushed:   °¨ After medicines have been given.   °¨ After blood has been drawn.   °¨ As part of routine maintenance.   °· If a constant infusion is running, the port may not need to be flushed.   °HOW LONG WILL MY PORT STAY IMPLANTED? °The port can stay in for as long as your health care   provider thinks it is needed. When it is time for the port to come out, surgery will be done to remove it. The procedure is similar to the one performed when the port was put in.  °WHEN SHOULD I SEEK IMMEDIATE MEDICAL CARE? °When you have an implanted port, you should seek immediate medical care if:  °· You notice a bad smell coming from the incision site.   °· You have swelling, redness, or drainage at the incision site.   °· You have more swelling or pain at the port site or the surrounding area.   °· You have a fever that is not controlled with medicine. °This information is not intended to replace advice given to you by your health care provider. Make sure you discuss any questions you have with  your health care provider. °Document Released: 05/28/2005 Document Revised: 03/18/2013 Document Reviewed: 02/02/2013 °Elsevier Interactive Patient Education © 2017 [employer] °Implanted Port Insertion, Care After °Refer to this sheet in the next few weeks. These instructions provide you with information on caring for yourself after your procedure. Your health care provider may also give you more specific instructions. Your treatment has been planned according to current medical practices, but problems sometimes occur. Call your health care provider if you have any problems or questions after your procedure. °WHAT TO EXPECT AFTER THE PROCEDURE °After your procedure, it is typical to have the following:  °· Discomfort at the port insertion site. Ice packs to the area will help. °· Bruising on the skin over the port. This will subside in 3-4 days. °HOME CARE INSTRUCTIONS °· After your port is placed, you will get a manufacturer's information card. The card has information about your port. Keep this card with you at all times.   °· Know what kind of port you have. There are many types of ports available.   °· Wear a medical alert bracelet in case of an emergency. This can help alert health care workers that you have a port.   °· The port can stay in for as long as your health care provider believes it is necessary.   °· A home health care nurse may give medicines and take care of the port.   °· You or a family member can get special training and directions for giving medicine and taking care of the port at home.   °SEEK MEDICAL CARE IF:  °· Your port does not flush or you are unable to get a blood return.   °· You have a fever or chills. °SEEK IMMEDIATE MEDICAL CARE IF: °· You have new fluid or pus coming from your incision.   °· You notice a bad smell coming from your incision site.   °· You have swelling, pain, or more redness at the incision or port site.   °· You have chest pain or shortness of breath. °This  information is not intended to replace advice given to you by your health care provider. Make sure you discuss any questions you have with your health care provider. °Document Released: 03/18/2013 Document Revised: 06/02/2013 Document Reviewed: 03/18/2013 °Elsevier Interactive Patient Education © 2017 [employer] °Moderate Conscious Sedation, Adult, Care After °These instructions provide you with information about caring for yourself after your procedure. Your health care provider may also give you more specific instructions. Your treatment has been planned according to current medical practices, but problems sometimes occur. Call your health care provider if you have any problems or questions after your procedure. °What can I expect after the procedure? °After your procedure, it is common: °·   To feel sleepy for several hours. °· To feel clumsy and have poor balance for several hours. °· To have poor judgment for several hours. °· To vomit if you eat too soon. °Follow these instructions at home: °For at least 24 hours after the procedure:  °· Do not: °¨ Participate in activities where you could fall or become injured. °¨ Drive. °¨ Use heavy machinery. °¨ Drink alcohol. °¨ Take sleeping pills or medicines that cause drowsiness. °¨ Make important decisions or sign legal documents. °¨ Take care of children on your own. °· Rest. °Eating and drinking °· Follow the diet recommended by your health care provider. °· If you vomit: °¨ Drink water, juice, or soup when you can drink without vomiting. °¨ Make sure you have little or no nausea before eating solid foods. °General instructions °· Have a responsible adult stay with you until you are awake and alert. °· Take over-the-counter and prescription medicines only as told by your health care provider. °· If you smoke, do not smoke without supervision. °· Keep all follow-up visits as told by your health care provider. This is important. °Contact a health care provider  if: °· You keep feeling nauseous or you keep vomiting. °· You feel light-headed. °· You develop a rash. °· You have a fever. °Get help right away if: °· You have trouble breathing. °This information is not intended to replace advice given to you by your health care provider. Make sure you discuss any questions you have with your health care provider. °Document Released: 03/18/2013 Document Revised: 10/31/2015 Document Reviewed: 09/17/2015 °Elsevier Interactive Patient Education © 2017 [employer] ° °

## 2016-06-21 ENCOUNTER — Ambulatory Visit (HOSPITAL_BASED_OUTPATIENT_CLINIC_OR_DEPARTMENT_OTHER): Payer: Medicare Other

## 2016-06-21 ENCOUNTER — Telehealth: Payer: Self-pay | Admitting: Hematology and Oncology

## 2016-06-21 ENCOUNTER — Ambulatory Visit (HOSPITAL_BASED_OUTPATIENT_CLINIC_OR_DEPARTMENT_OTHER): Payer: Medicare Other | Admitting: Hematology and Oncology

## 2016-06-21 ENCOUNTER — Encounter: Payer: Self-pay | Admitting: Hematology and Oncology

## 2016-06-21 ENCOUNTER — Other Ambulatory Visit (HOSPITAL_BASED_OUTPATIENT_CLINIC_OR_DEPARTMENT_OTHER): Payer: Medicare Other

## 2016-06-21 ENCOUNTER — Ambulatory Visit: Payer: Medicare Other

## 2016-06-21 ENCOUNTER — Other Ambulatory Visit: Payer: Self-pay | Admitting: Hematology and Oncology

## 2016-06-21 VITALS — BP 112/90 | HR 64 | Temp 96.9°F | Resp 17 | Ht 73.0 in | Wt 167.5 lb

## 2016-06-21 DIAGNOSIS — C7802 Secondary malignant neoplasm of left lung: Secondary | ICD-10-CM | POA: Diagnosis not present

## 2016-06-21 DIAGNOSIS — C139 Malignant neoplasm of hypopharynx, unspecified: Secondary | ICD-10-CM | POA: Diagnosis present

## 2016-06-21 DIAGNOSIS — D539 Nutritional anemia, unspecified: Secondary | ICD-10-CM | POA: Insufficient documentation

## 2016-06-21 DIAGNOSIS — D61818 Other pancytopenia: Secondary | ICD-10-CM | POA: Insufficient documentation

## 2016-06-21 DIAGNOSIS — I2782 Chronic pulmonary embolism: Secondary | ICD-10-CM | POA: Diagnosis not present

## 2016-06-21 DIAGNOSIS — G893 Neoplasm related pain (acute) (chronic): Secondary | ICD-10-CM

## 2016-06-21 DIAGNOSIS — Z5112 Encounter for antineoplastic immunotherapy: Secondary | ICD-10-CM

## 2016-06-21 DIAGNOSIS — C7801 Secondary malignant neoplasm of right lung: Secondary | ICD-10-CM

## 2016-06-21 DIAGNOSIS — E440 Moderate protein-calorie malnutrition: Secondary | ICD-10-CM

## 2016-06-21 DIAGNOSIS — Z5111 Encounter for antineoplastic chemotherapy: Secondary | ICD-10-CM

## 2016-06-21 DIAGNOSIS — C7800 Secondary malignant neoplasm of unspecified lung: Secondary | ICD-10-CM

## 2016-06-21 DIAGNOSIS — G4700 Insomnia, unspecified: Secondary | ICD-10-CM | POA: Insufficient documentation

## 2016-06-21 DIAGNOSIS — F19982 Other psychoactive substance use, unspecified with psychoactive substance-induced sleep disorder: Secondary | ICD-10-CM

## 2016-06-21 LAB — COMPREHENSIVE METABOLIC PANEL
ALT: 14 U/L (ref 0–55)
AST: 40 U/L — AB (ref 5–34)
Albumin: 3.1 g/dL — ABNORMAL LOW (ref 3.5–5.0)
Alkaline Phosphatase: 63 U/L (ref 40–150)
Anion Gap: 7 mEq/L (ref 3–11)
BUN: 13.8 mg/dL (ref 7.0–26.0)
CALCIUM: 9.2 mg/dL (ref 8.4–10.4)
CHLORIDE: 103 meq/L (ref 98–109)
CO2: 25 mEq/L (ref 22–29)
Creatinine: 0.8 mg/dL (ref 0.7–1.3)
GLUCOSE: 89 mg/dL (ref 70–140)
POTASSIUM: 3.8 meq/L (ref 3.5–5.1)
SODIUM: 135 meq/L — AB (ref 136–145)
Total Bilirubin: 0.7 mg/dL (ref 0.20–1.20)
Total Protein: 7.8 g/dL (ref 6.4–8.3)

## 2016-06-21 LAB — CBC WITH DIFFERENTIAL/PLATELET
BASO%: 1.1 % (ref 0.0–2.0)
BASOS ABS: 0 10*3/uL (ref 0.0–0.1)
EOS%: 13.7 % — AB (ref 0.0–7.0)
Eosinophils Absolute: 0.5 10*3/uL (ref 0.0–0.5)
HCT: 32.1 % — ABNORMAL LOW (ref 38.4–49.9)
HGB: 10 g/dL — ABNORMAL LOW (ref 13.0–17.1)
LYMPH%: 31.3 % (ref 14.0–49.0)
MCH: 24.6 pg — AB (ref 27.2–33.4)
MCHC: 31.2 g/dL — AB (ref 32.0–36.0)
MCV: 79.1 fL — AB (ref 79.3–98.0)
MONO#: 0.7 10*3/uL (ref 0.1–0.9)
MONO%: 20.1 % — AB (ref 0.0–14.0)
NEUT#: 1.2 10*3/uL — ABNORMAL LOW (ref 1.5–6.5)
NEUT%: 33.8 % — AB (ref 39.0–75.0)
Platelets: 184 10*3/uL (ref 140–400)
RBC: 4.06 10*6/uL — ABNORMAL LOW (ref 4.20–5.82)
RDW: 17.2 % — ABNORMAL HIGH (ref 11.0–14.6)
WBC: 3.6 10*3/uL — ABNORMAL LOW (ref 4.0–10.3)
lymph#: 1.1 10*3/uL (ref 0.9–3.3)

## 2016-06-21 LAB — TSH: TSH: 2.653 m[IU]/L (ref 0.320–4.118)

## 2016-06-21 MED ORDER — HEPARIN SOD (PORK) LOCK FLUSH 100 UNIT/ML IV SOLN
500.0000 [IU] | Freq: Once | INTRAVENOUS | Status: AC | PRN
  Administered 2016-06-21: 500 [IU]
  Filled 2016-06-21: qty 5

## 2016-06-21 MED ORDER — MIRTAZAPINE 15 MG PO TABS: 15.0000 mg | ORAL_TABLET | Freq: Every day | ORAL | 1 refills | Status: DC

## 2016-06-21 MED ORDER — SODIUM CHLORIDE 0.9 % IV SOLN
Freq: Once | INTRAVENOUS | Status: AC
  Administered 2016-06-21: 14:00:00 via INTRAVENOUS

## 2016-06-21 MED ORDER — SODIUM CHLORIDE 0.9% FLUSH
10.0000 mL | INTRAVENOUS | Status: DC | PRN
  Administered 2016-06-21: 10 mL
  Filled 2016-06-21: qty 10

## 2016-06-21 MED ORDER — OXYCODONE HCL 10 MG PO TABS: 10.0000 mg | ORAL_TABLET | ORAL | 0 refills | Status: DC | PRN

## 2016-06-21 MED ORDER — SODIUM CHLORIDE 0.9 % IV SOLN
200.0000 mg | Freq: Once | INTRAVENOUS | Status: AC
  Administered 2016-06-21: 200 mg via INTRAVENOUS
  Filled 2016-06-21: qty 8

## 2016-06-21 NOTE — Patient Instructions (Signed)

## 2016-06-21 NOTE — Patient Instructions (Signed)
[facility] Discharge Instructions for Patients Receiving Chemotherapy  Today you received the following chemotherapy agents: Keytruda   To help prevent nausea and vomiting after your treatment, we encourage you to take your nausea medication as directed    If you develop nausea and vomiting that is not controlled by your nausea medication, call the clinic.   BELOW ARE SYMPTOMS THAT SHOULD BE REPORTED IMMEDIATELY:  *FEVER GREATER THAN 100.5 F  *CHILLS WITH OR WITHOUT FEVER  NAUSEA AND VOMITING THAT IS NOT CONTROLLED WITH YOUR NAUSEA MEDICATION  *UNUSUAL SHORTNESS OF BREATH  *UNUSUAL BRUISING OR BLEEDING  TENDERNESS IN MOUTH AND THROAT WITH OR WITHOUT PRESENCE OF ULCERS  *URINARY PROBLEMS  *BOWEL PROBLEMS  UNUSUAL RASH Items with * indicate a potential emergency and should be followed up as soon as possible.  Feel free to call the clinic you have any questions or concerns. The clinic phone number is [phone].  Please show the CHEMO ALERT CARD at check-in to the Emergency Department and triage nurse.   

## 2016-06-21 NOTE — Assessment & Plan Note (Signed)
He has mild COPD This is actually improving on prednisone Continue the same

## 2016-06-21 NOTE — Assessment & Plan Note (Signed)
Cause is unknown. I will order serum vitamin B-12 and iron studies with his next visit

## 2016-06-21 NOTE — Assessment & Plan Note (Signed)
He had history of DVT and PE. He is on chronic anticoagulation therapy. The patient denies any recent signs or symptoms of bleeding such as spontaneous epistaxis, hematuria or hematochezia.

## 2016-06-21 NOTE — Progress Notes (Signed)
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 No lymphadenopathy in the neck. A CT of the chest on 08/23/13 showed bullous emphysema and 2 nodules possibly representing metastatic disease including a 1.4cm left upper lobe nodule and a 1.4cm nodule in the right middle lobe.              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 Bilateral hypermetabolic pulmonary nodules are concerning for bilateral lung metastases.      09/14/2013 - 11/09/2013 Chemotherapy    The patient had weekly cisplatin x 3 with radiation at [facility]. Chemo was stopped due to intolerable toxicities       09/14/2013 - 10/12/2013 Radiation Therapy    He received radiation therapy at [facility]. IMRT was used to treat the primary tumor and regional lymphatics to 4600cGy in 200cGy daily fractions over 23 days followed by a boost of 2400cGy in 12 days to a total dose of 7000cGy.        09/22/2013 Procedure    He has placement of port       09/29/2013 Procedure    He has placement of feeding tube      10/12/2013 Procedure    He has removal of port      10/17/2013 Imaging    Ct neck showed thrombus is present in the jugular vein from the skull base to the brachiocephalic vein. A small venous branch or collateral also demonstrates thrombus in the anterior right neck. The SVC is patent. The right-sided  laryngeal mass is smaller with decreased but persistent right to left mass effect on the airway. Severe atherosclerotic changes at the bifurcation of the carotid arteries on the left, greater than right. In the dermis of the upper anterior right chest wall there is a small fluid and air collection measuring 2.3 x 1.1 cm.      10/17/2013 Imaging    Ct chest showed The SVC is patent however there is thrombus extending throughout the jugular vein to just below the brachiocephalic vein. There is also likely thrombus and a small peripheral vein in the anterior right neck. Several pulmonary nodules, one larger nodule appears smaller with another larger nodule stable and several new small pulmonary nodules. Follow-up recommended. New mild right lower lobe airspace disease with several nodular opacities. This may represent infection. No pleural effusion.      01/01/2014 Imaging    Ct chest showed Interval improvement in heterogeneous right lower lobe opacity, likely improving infection/aspiration. Previously visualized FDG avid left upper lobe nodule is less solid compared to most recent prior but not significantly changed in size. Previously visualized FDG avid right middle lobe nodule has increased in size compared to most recent prior, likely disease progression. Significant interval increase in size of left upper lobe nodules consistent with progressive metastatic disease. Other smaller previously visualized nodules are not visualized and were likely  inflammatory/infectious in etiology.      01/01/2014 Imaging    Ct neck showed thrombus present in the jugular vein from skull base to brachiocephalic vein, the SVC remains patent. Extensive atherosclerotic calcifications of the carotid bifurcations left greater than right the mass in the hypopharynx. Improved appearance of the hypopharyngeal mass with less edema and last less shift to the left. Less edema and fluid within the adjacent soft tissues also  demonstrated. Increased fluid in the retropharyngeal prevertebral soft tissues.      04/15/2014 Imaging    CT pulmonary arteriogram demonstrates no evidence of pulmonary embolism. Bilateral pulmonary lesions demonstrated enlarged since the prior study. A new lesion is also seen within the lingula. Findings would be typical of pulmonary metastatic lesions. Focal parenchymal infiltrate demonstrated in the right lower lobe. Left basilar atelectasis. Middle lobe infiltrate or atelectasis demonstrated with underlying bronchiectasis. Lingular infiltrate or atelectasis also      05/03/2014 Imaging    Ct chest showed interval enlargement of at least 2 pulmonary nodules as described above. Findings are suspicious for worsening disease.  Interval development of consolidation within the lateral segment right middle lobe could be related to a filling defect in a branch of the lateral segment right middle lobe bronchus. The consolidation favored to represent atelectasis. The endobronchial focus could represent mucoid impactionor possibly a fixed endobronchial lesion. Aspiration would be less likely given its location in the right middle lobe.      05/03/2014 Imaging    Ct neck showed no significant change in right hypopharyngeal mass. Stable airway asymmetry. Ill-defined soft tissue along the carotid space carotid encasement mildly improved. Multispatial soft tissue stranding/fluid has improved. Right internal jugular vein not visualized..      05/13/2014 Procedure    Tracheostomy was removed         06/16/2014 - 03/16/2015 Chemotherapy    The patient had carboplatin AUC 2 and Taxol 45 mg/m2 at [facility]       06/16/2014 Imaging    Ct chest showed stable dominant nodules from recent prior. Presumably, metastatic disease. New increased groundglass attenuation opacities, right lower lobe, centrilobular and clumped nodules, as well as a more dominant nodular opacity. While these findings could be secondary to  progressive metastatic disease, given their location and appearance, aspiration pneumonia/pneumonitis is also possible.      06/21/2014 Imaging    Ct abdomen showed gastrostomy tube is positioned within the stomach. No evidence for an acute inflammatory process within the abdomen or pelvis. No large or small bowel obstruction. No change right lower lobe airspace disease and nodular opacities      09/30/2014 Imaging    Ct chest showed interval decrease in size of metastatic pulmonary nodules. One of these nodules demonstrates new cavitation within the right upper lobe. Interval improvement of nodular opacities within the right lower lobe suggestive of a resolving aspiration.      04/01/2015 Imaging    Ct chest showed small amount of right upper lobe acute, nonocclusive pulmonary embolus. Enlarged left upper lobe pulmonary metastasis. Mixed response with resolved previous smaller nodule in the lingula. Right middle and right lower lobe appearance likely represents posttreatment cicatrization atelectasis (query if radiation therapy has been performed in this region). Worsened right infrahilar bronchial wall thickening with occlusion of a subsegmental branch. No evidence for postobstructive pneumonia. Paraseptal emphysema.      04/19/2015 - 02/29/2016 Chemotherapy    The patient had Keytruda at [facility].  He subsequently relocated to [facility]  04/29/2015 Imaging    Ct neck showed interval significant reduction in size of previous large right supraglottic mass. Airway is now widely patent at this level. No definite discrete supraglottic or pharyngeal mass demonstrated currently by CT. Left upper lobe spiculated mass. Prominent calcified atherosclerotic plaquing involving the carotid bifurcations and carotid bulbs greater on the left resulting in stenosis of the internal carotid artery origins. Consider follow-up carotid Doppler ultrasound exam for further evaluation.      07/13/2015 Imaging    Ct  neck showed expected post-treatment changes in the neck without evidence of recurrent disease in the primary site. No abnormal lymph nodes. Significant biapical bullous disease /paraseptal emphysema in the lungs, unchanged from prior. Previously seen left upper lobe nodular mass is visualized on the same-day CT chest.       07/13/2015 Imaging    Ct chest showed interval decrease in size of a spiculated left upper lobe nodule. Numerous additional nonspecific pulmonary nodules of the bilateral lungs, which are stable compared to prior examination. Clustered tree-in-bud nodules of the right lung base are likely sequelae of Aspiration. Bronchiectasis and atelectasis of the right middle lobe. Emphysema.      08/24/2015 Miscellaneous    He received IV feraheme      09/20/2015 Imaging    CT scan showed acute, comminuted, medial right clavicular fracture and marked, overlying soft tissue swelling. Mild, acute, T10 compression fracture.  The known left upper lobe carcinoma has modestly decreased in size since the 04/01/2015 comparison. Perhaps there has been a response to interim therapy. Right middle lobe and lower lobe bronchiectasis and pleuroparenchymal scarring were also present on the 04/01/2015 comparison. Confluent consolidation, however, is less striking than on the prior. Perhaps this is residua of prior pneumonia or radiation. Marked bullous emphysema is unchanged. Advanced, 3-vessel coronary artery disease. The liver is unusual in shape, but the findings are not definitive for cirrhosis. The appearance is unchanged since 08/19/2014.      09/20/2015 Imaging    Ct head showed mild patchy low-density present in the periventricular white matter. No mass, hemorrhage, or extracerebral fluid.  Mild supratentorial ventricle and sulcal prominence within normal limits.  A component of diffuse cerebellar atrophy is present. Carotid siphon and distal left vertebral vascular calcifications are present. On bone  windows, no calvarial lesion. Normal mastoid aeration. Mucosal thickening left posterior ethmoid air cells and retention cyst left maxillary sinus.      10/05/2015 Imaging    CT chest, abdomen and pelvis showed increasing size of spiculated nodule in the left upper lobe. Increasing adjacent groundglass opacities are concerning for lymphangitic spread of tumor. Additional pulmonary nodules are unchanged. Multiple mildly enlarged mesenteric lymph nodes, similar to prior. Sclerotic changes and height loss of the T10 vertebral body, new from prior. This is concerning for osseous metastatic disease. Extensive atherosclerotic disease at the origin of the celiac and throughout the proximal superior mesenteric artery.      10/05/2015 Imaging    Ct neck showed decreased supraglottic edema, with residual asymmetric fullness right greater than left. Stable post-therapeutic retropharyngeal edema. Comminuted fracture of the proximal right clavicle/clavicular head with surrounding soft tissue and pectoral stranding, new in the interval. Findings could be posttraumatic and/or pathologic.      11/16/2015 Imaging    Ct chest showed stable dominant left upper lobe nodule, and surrounding opacities which may be related to radiation. New 10-14-mm left upper lobe nodule, anterior to dominant nodule and radiation change. While this could also represent radiation  change, infection or neoplasm are in the differential diagnosis      12/27/2015 Imaging    Ct chest showed stable to slight decrease in nodular consolidative opacities, which are likely related to prior therapy. No new abnormalities      01/31/2016 Imaging    CT brain at [facility] showed no metastatic disease. No acute intracranial process.      03/22/2016 -  Chemotherapy    He receives Keytruda       04/11/2016 Imaging    Ct chest showed 15 x 10 mm left upper lobe nodule, mildly decreased and likely reflecting improving metastasis, with surrounding radiation  changes. Additional multifocal patchy opacities in the left lower lobe, favored to reflect radiation changes, less likely infection.      04/11/2016 Imaging    Ct neck showed regressed diffuse pharyngeal mucosal space soft tissue thickening since April 2017, likely sequelae of radiation. No residual pharyngeal mass or lymphadenopathy identified. Pathologic appearing fracture of the medial right clavicle redemonstrated, with some interval periosteal new bone formation but no solid osseous union. No osseous metastatic disease identified in the neck.       INTERVAL HISTORY: Please see below for problem oriented charting. He returns for chemotherapy follow-up. He felt better. He has less wheezing and has gained some weight. He complained of mild insomnia His cancer associated pain is stable This shortness of breath is stable The patient denies any recent signs or symptoms of bleeding such as spontaneous epistaxis, hematuria or hematochezia.   REVIEW OF SYSTEMS:   Constitutional: Denies fevers, chills or abnormal weight loss Eyes: Denies blurriness of vision Ears, nose, mouth, throat, and face: Denies mucositis or sore throat Cardiovascular: Denies palpitation, chest discomfort or lower extremity swelling Gastrointestinal:  Denies nausea, heartburn or change in bowel habits Skin: Denies abnormal skin rashes Lymphatics: Denies new lymphadenopathy or easy bruising Neurological:Denies numbness, tingling or new weaknesses Behavioral/Psych: Mood is stable, no new changes  All other systems were reviewed with the patient and are negative.  I have reviewed the past medical history, past surgical history, social history and family history with the patient and they are unchanged from previous note.  ALLERGIES:  is allergic to lisinopril.  MEDICATIONS:  Current Outpatient Prescriptions  Medication Sig Dispense Refill  . albuterol (ACCUNEB) 1.25 MG/3ML nebulizer solution Take 3 mLs (1.25 mg  total) by nebulization every 6 (six) hours as needed for wheezing or shortness of breath. 360 mL 0  . albuterol (PROVENTIL HFA;VENTOLIN HFA) 108 (90 Base) MCG/ACT inhaler Inhale 2 puffs into the lungs every 4 (four) hours as needed for wheezing or shortness of breath. 18 g 0  . budesonide-formoterol (SYMBICORT) 160-4.5 MCG/ACT inhaler Inhale 2 puffs into the lungs 2 (two) times daily. 1 Inhaler 6  . diltiazem (CARDIZEM CD) 180 MG 24 hr capsule Take 1 capsule (180 mg total) by mouth daily. 30 capsule 0  . gabapentin (NEURONTIN) 100 MG capsule Take 1 capsule (100 mg total) by mouth at bedtime. 30 capsule 0  . guaiFENesin-codeine (ROBITUSSIN AC) 100-10 MG/5ML syrup Take by mouth.    . isosorbide dinitrate (ISORDIL) 20 MG tablet Take 1 tablet (20 mg total) by mouth 2 (two) times daily. 60 tablet 0  . lidocaine-prilocaine (EMLA) cream Apply 1 application topically as needed. Apply to [facility] a Cath site at least one hour prior to needle stick 30 g 3  . magnesium oxide (MAG-OX) 400 MG tablet Take 1 tablet (400 mg total) by mouth daily. 30 tablet 0  .  nitroGLYCERIN (NITROSTAT) 0.3 MG SL tablet Place 1 tablet (0.3 mg total) under the tongue every 5 (five) minutes as needed for chest pain. 90 tablet 12  . OLANZapine (ZYPREXA) 5 MG tablet Take 1 tablet (5 mg total) by mouth at bedtime. 30 tablet 0  . ondansetron (ZOFRAN-ODT) 8 MG disintegrating tablet Take 1 tablet (8 mg total) by mouth every 8 (eight) hours as needed for nausea or vomiting. 30 tablet 0  . Oxycodone HCl 10 MG TABS Take 1 tablet (10 mg total) by mouth every 4 (four) hours as needed. 90 tablet 0  . pantoprazole (PROTONIX) 40 MG tablet Take 1 tablet (40 mg total) by mouth daily. 30 tablet 0  . predniSONE (DELTASONE) 10 MG tablet Take 1 tablet (10 mg total) by mouth daily with breakfast. 30 tablet 11  . prochlorperazine (COMPAZINE) 10 MG tablet Take 1 tablet (10 mg total) by mouth every 6 (six) hours as needed for nausea or vomiting. 90 tablet 6  .  rivaroxaban (XARELTO) 20 MG TABS tablet Take 1 tablet (20 mg total) by mouth daily with supper. 30 tablet 0  . mirtazapine (REMERON) 15 MG tablet Take 1 tablet (15 mg total) by mouth at bedtime. 30 tablet 1   No current facility-administered medications for this visit.    Facility-Administered Medications Ordered in Other Visits  Medication Dose Route Frequency Provider Last Rate Last Dose  . sodium chloride flush (NS) 0.9 % injection 10 mL  10 mL Intracatheter PRN Heath Lark, MD   10 mL at 06/21/16 1451    PHYSICAL EXAMINATION: ECOG PERFORMANCE STATUS: 1 - Symptomatic but completely ambulatory  Vitals:   06/21/16 1308  BP: 112/90  Pulse: 64  Resp: 17  Temp: (!) 96.9 F (36.1 C)   Filed Weights   06/21/16 1308  Weight: 167 lb 8 oz (76 kg)    GENERAL:alert, no distress and comfortable SKIN: skin color, texture, turgor are normal, no rashes or significant lesions EYES: normal, Conjunctiva are pink and non-injected, sclera clear OROPHARYNX:no exudate, no erythema and lips, buccal mucosa, and tongue normal  NECK: supple, thyroid normal size, non-tender, without nodularity LYMPH:  no palpable lymphadenopathy in the cervical, axillary or inguinal LUNGS: clear to auscultation and percussion with normal breathing effort HEART: regular rate & rhythm and no murmurs and no lower extremity edema ABDOMEN:abdomen soft, non-tender and normal bowel sounds Musculoskeletal:no cyanosis of digits and no clubbing  NEURO: alert & oriented x 3 with fluent speech, no focal motor/sensory deficits  LABORATORY DATA:  I have reviewed the data as listed    Component Value Date/Time   NA 135 (L) 06/21/2016 1222   K 3.8 06/21/2016 1222   CL 99 (L) 06/13/2016 0935   CO2 25 06/21/2016 1222   GLUCOSE 89 06/21/2016 1222   BUN 13.8 06/21/2016 1222   CREATININE 0.8 06/21/2016 1222   CALCIUM 9.2 06/21/2016 1222   PROT 7.8 06/21/2016 1222   ALBUMIN 3.1 (L) 06/21/2016 1222   AST 40 (H) 06/21/2016 1222    ALT 14 06/21/2016 1222   ALKPHOS 63 06/21/2016 1222   BILITOT 0.70 06/21/2016 1222   GFRNONAA >60 06/13/2016 0935   GFRAA >60 06/13/2016 0935    No results found for: SPEP, UPEP  Lab Results  Component Value Date   WBC 3.6 (L) 06/21/2016   NEUTROABS 1.2 (L) 06/21/2016   HGB 10.0 (L) 06/21/2016   HCT 32.1 (L) 06/21/2016   MCV 79.1 (L) 06/21/2016   PLT 184 06/21/2016  Chemistry      Component Value Date/Time   NA 135 (L) 06/21/2016 1222   K 3.8 06/21/2016 1222   CL 99 (L) 06/13/2016 0935   CO2 25 06/21/2016 1222   BUN 13.8 06/21/2016 1222   CREATININE 0.8 06/21/2016 1222      Component Value Date/Time   CALCIUM 9.2 06/21/2016 1222   ALKPHOS 63 06/21/2016 1222   AST 40 (H) 06/21/2016 1222   ALT 14 06/21/2016 1222   BILITOT 0.70 06/21/2016 1222       RADIOGRAPHIC STUDIES: I have personally reviewed the radiological images as listed and agreed with the findings in the report. 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 EXAM: FLUOROSCOPIC AND ULTRASOUND GUIDED PLACEMENT OF A SUBCUTANEOUS PORT. Physician: Stephan Minister. Anselm Pancoast, MD MEDICATIONS: Ancef 2 g; As antibiotic prophylaxis, Ancef was ordered pre-procedure and administered intravenously within one hour of incision. ANESTHESIA/SEDATION: Versed 3.5 mg IV; Fentanyl 175 mcg IV; Moderate Sedation Time:  43 minutes The patient was continuously monitored during the procedure by the interventional radiology nurse under my direct supervision. FLUOROSCOPY TIME:  1 minutes, 6 mGy COMPLICATIONS: None immediate. CONTRAST:  1 mL Isovue-300 PROCEDURE: The risks of the procedure were explained to the patient. Informed consent was obtained. Patient was placed supine on the interventional table. 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 Ultrasound confirmed a patent left internal jugular vein. The left chest and  neck were cleaned with a skin antiseptic and a sterile drape was placed. Maximal barrier sterile technique was utilized including caps, mask, sterile gowns, sterile gloves, sterile drape, hand hygiene and skin antiseptic. The left neck was anesthetized with 1% lidocaine. Small incision was made in the left neck with a blade. Micropuncture set was placed in the left IJ with ultrasound guidance. The micropuncture wire was used for measurement purposes. The left chest was anesthetized with 1% lidocaine with epinephrine. #15 blade was used to make an incision and a subcutaneous port pocket was formed. [place] was assembled. Subcutaneous tunnel was formed with a stiff tunneling device. The port catheter was brought through the subcutaneous tunnel. The port was placed in the subcutaneous pocket. The micropuncture set was exchanged for a peel-away sheath. The catheter was placed through the peel-away sheath. Fluoroscopy demonstrated the catheter was in the azygos vein. The catheter would not move with flushing. Therefore, the catheter was removed from the pocket and disconnected form the port. The catheter was repositioned in the SVC with a Bentson wire. The port was then re- attached to the catheter and placed back in the pocket. Small amount of dilute contrast was injected through the port to confirm no leakage and no discontinuity. Catheter positioned at the superior cavoatrial junction. Catheter placement was confirmed with fluoroscopy. The port was accessed and flushed with heparinized saline. The port pocket was closed using two layers of absorbable sutures and Dermabond. The vein skin site was closed using a single layer of absorbable suture and Dermabond. Sterile dressings were applied. Patient tolerated the procedure well without an immediate complication. Ultrasound and fluoroscopic images were taken and saved for this procedure. IMPRESSION: Placement of a subcutaneous port device. Catheter tip at the  superior cavoatrial junction. Electronically Signed   By: Markus Daft M.D.   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 EXAM: FLUOROSCOPIC AND  ULTRASOUND GUIDED PLACEMENT OF A SUBCUTANEOUS PORT. Physician: Stephan Minister. Anselm Pancoast, MD MEDICATIONS: Ancef 2 g; As antibiotic prophylaxis, Ancef was ordered pre-procedure and administered intravenously within one hour of incision. ANESTHESIA/SEDATION: Versed 3.5 mg IV; Fentanyl 175 mcg IV; Moderate Sedation Time:  43 minutes The patient was continuously monitored during the procedure by the interventional radiology nurse under my direct supervision. FLUOROSCOPY TIME:  1 minutes, 6 mGy COMPLICATIONS: None immediate. CONTRAST:  1 mL Isovue-300 PROCEDURE: The risks of the procedure were explained to the patient. Informed consent was obtained. Patient was placed supine on the interventional table. 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 Ultrasound confirmed a patent left internal jugular vein. The left chest and neck were cleaned with a skin antiseptic and a sterile drape was placed. Maximal barrier sterile technique was utilized including caps, mask, sterile gowns, sterile gloves, sterile drape, hand hygiene and skin antiseptic. The left neck was anesthetized with 1% lidocaine. Small incision was made in the left neck with a blade. Micropuncture set was placed in the left IJ with ultrasound guidance. The micropuncture wire was used for measurement purposes. The left chest was anesthetized with 1% lidocaine with epinephrine. #15 blade was used to make an incision and a subcutaneous port pocket was formed. [place] was assembled. Subcutaneous tunnel was formed with a stiff tunneling device. The port catheter was brought through the subcutaneous tunnel. The port was placed in the subcutaneous pocket. The  micropuncture set was exchanged for a peel-away sheath. The catheter was placed through the peel-away sheath. Fluoroscopy demonstrated the catheter was in the azygos vein. The catheter would not move with flushing. Therefore, the catheter was removed from the pocket and disconnected form the port. The catheter was repositioned in the SVC with a Bentson wire. The port was then re- attached to the catheter and placed back in the pocket. Small amount of dilute contrast was injected through the port to confirm no leakage and no discontinuity. Catheter positioned at the superior cavoatrial junction. Catheter placement was confirmed with fluoroscopy. The port was accessed and flushed with heparinized saline. The port pocket was closed using two layers of absorbable sutures and Dermabond. The vein skin site was closed using a single layer of absorbable suture and Dermabond. Sterile dressings were applied. Patient tolerated the procedure well without an immediate complication. Ultrasound and fluoroscopic images were taken and saved for this procedure. IMPRESSION: Placement of a subcutaneous port device. Catheter tip at the superior cavoatrial junction. Electronically Signed   By: Markus Daft M.D.   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 His most recent CT scan in October 2017 showed a positive response to treatment We will continue treatment every 3 weeks as scheduled and a plan to repeat another imaging study in 3 months, due February 2018   Protein-calorie malnutrition, moderate ([place]) We discussed appetite stimulant. I found out that one of the barriers to eating well is related to severe constipation. We spent a lot of time discussing about this I started him on prednisone He has gained some weight. I recommend  addition of Remeron that might also help with his sleep.  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 Pain has improved with increased dose oxycodone I warned him about risk of nausea and constipation. I refilled his prescriptions   Pulmonary embolism ([place]) He had history of DVT and PE. He is on chronic anticoagulation therapy. The patient denies any recent signs or symptoms of bleeding such as spontaneous epistaxis, hematuria or hematochezia.  Pancytopenia, acquired ([place]) Cause is unknown. I will order serum vitamin B-12 and iron studies with his next visit   Orders Placed This Encounter  Procedures  . Vitamin B12    Standing Status:   Future    Standing Expiration Date:   07/26/2017  . Iron and TIBC    Standing Status:   Future    Standing Expiration Date:   07/26/2017  . Ferritin    Standing Status:   Future    Standing Expiration Date:   07/26/2017   All questions were answered. The patient knows to call the clinic with any problems, questions or concerns. No barriers to learning was detected. I spent 25 minutes counseling the patient face to face. The total time spent in the appointment was 30 minutes and more than 50% was on counseling and review of test results     Heath Lark, MD 06/21/2016 5:30 PM

## 2016-06-21 NOTE — Assessment & Plan Note (Signed)
We discussed appetite stimulant. I found out that one of the barriers to eating well is related to severe constipation. We spent a lot of time discussing about this I started him on prednisone He has gained some weight. I recommend addition of Remeron that might also help with his sleep.

## 2016-06-21 NOTE — Assessment & Plan Note (Signed)
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 His most recent CT scan in October 2017 showed a positive response to treatment We will continue treatment every 3 weeks as scheduled and a plan to repeat another imaging study in 3 months, due February 2018

## 2016-06-21 NOTE — Telephone Encounter (Signed)
Called patient to reschedule appointment time for patient. Spoke with niece, she will have the care taker who provides transportation call to reschedule appointment time for 07/12/16.

## 2016-06-21 NOTE — Assessment & Plan Note (Signed)
He has chest wall pain from his disease. Pain has improved with increased dose oxycodone I warned him about risk of nausea and constipation. I refilled his prescriptions

## 2016-06-25 ENCOUNTER — Telehealth: Payer: Self-pay | Admitting: *Deleted

## 2016-06-25 NOTE — Telephone Encounter (Signed)
Notified pt of 4 bottles Ensure from Dory Peru, RD at [employer].  He had requested some on his last visit but I did not get them to him before he left.   Pt says he will pick them up on next appt 2/1.  Will leave at nurse's desk to give him on that appt.Marland Kitchen

## 2016-07-05 ENCOUNTER — Ambulatory Visit (INDEPENDENT_AMBULATORY_CARE_PROVIDER_SITE_OTHER): Payer: Medicare Other | Admitting: Internal Medicine

## 2016-07-05 ENCOUNTER — Encounter: Payer: Self-pay | Admitting: Internal Medicine

## 2016-07-05 VITALS — BP 108/74 | HR 98 | Ht 73.0 in | Wt 163.0 lb

## 2016-07-05 DIAGNOSIS — J9611 Chronic respiratory failure with hypoxia: Secondary | ICD-10-CM

## 2016-07-05 DIAGNOSIS — J449 Chronic obstructive pulmonary disease, unspecified: Secondary | ICD-10-CM | POA: Diagnosis not present

## 2016-07-05 DIAGNOSIS — J9610 Chronic respiratory failure, unspecified whether with hypoxia or hypercapnia: Secondary | ICD-10-CM | POA: Insufficient documentation

## 2016-07-05 NOTE — Patient Instructions (Addendum)
Plan A = Automatic = symbicort 160 Take 2 puffs first thing in am and then another 2 puffs about 12 hours later.   Work on inhaler technique:  relax and gently blow all the way out then take a nice smooth deep breath back in, triggering the inhaler at same time you start breathing in.  Hold for up to 5 seconds if you can. Blow out thru nose. Rinse and gargle with water when done     Plan B = Backup Only use your albuterol as a rescue medication to be used if you can't catch your breath by resting or doing a relaxed purse lip breathing pattern.  - The less you use it, the better it will work when you need it. - Ok to use the inhaler up to 2 puffs  every 4 hours if you must but call for appointment if use goes up over your usual need - Don't leave home without it !!  (think of it like the spare tire for your car)   Plan C = Crisis - only use your albuterol nebulizer if you first try Plan B and it fails to help > ok to use the nebulizer up to every 4 hours but if start needing it regularly call for immediate appointment  For cough/ congestion use mucinex dm up to 1200 mg every 12 hours as needed  If not happy with your breathing, return with all inhalers and neb solutions in hand - pulmonary follow up is as needed

## 2016-07-05 NOTE — Assessment & Plan Note (Signed)
-   Spirometry 03/29/2016  FEV1 1.09  (32%)  Ratio 40   - 07/05/2016  After extensive coaching HFA effectiveness =    90%   He is very easily confused with details of care and despite not understanding how/ when to take his inhalers he's actually doing well enough so I did not change his rx but might consider adding a lama like spiriva respimat at his next visit if we can first do accurate med rec.  First step is to return with all meds in hand using a trust but verify approach to confirm accurate Medication  Reconciliation The principal here is that until we are certain that the  patients are doing what we've asked, it makes no sense to ask them to do more.    I had an extended discussion with the patient reviewing all relevant studies completed to date and  lasting 15 to 20 minutes of a 25 minute visit    Each maintenance medication was reviewed in detail including most importantly the difference between maintenance and prns and under what circumstances the prns are to be triggered using an action plan format that is not reflected in the computer generated alphabetically organized AVS.    Please see AVS for specific instructions unique to this visit that I personally wrote and verbalized to the the pt in detail and then reviewed with pt  by my nurse highlighting any  changes in therapy recommended at today's visit to their plan of care.

## 2016-07-05 NOTE — Progress Notes (Signed)
Subjective:    Patient ID: Zachary Cooper, male    DOB: 01/17/1951,     MRN: [id number]    Brief patient profile:  65 yobm quit smoking 02/08/16 dx of copd dx [address] by [facility] rx 02 and advair but stopped the 02 when moved to [place] 01/2016 to live with niece referred to pulmonary clinic 03/29/2016 by Dr  Alvy Bimler for copd eval -  dx is Stage IV metastatic hypopharyngeal cancer to the lungs, on palliative treatment with prev rx at [facility] originally dx 08/23/13 and His treatment course at [facility] was complicated by failure to thrive, recurrent infection with severe pneumonia, DVT related to line placement, PE, congestive heart failure, atrial fibrillation with rapid ventricular response, severe anemia requiring blood transfusions and occasional renal dysfunction   History of Present Illness  03/29/2016 1st [facility] office visit/ Zachary Cooper   Chief Complaint  Patient presents with  . Pulmonary Consult    Referred by Dr Alvy Bimler for COPD.   best days = 25 ft sob Hardly every cough up anything  Better while on 02 but off since moved to [place]  Sleeps poorly but not due to resp complaints Was on advair but didn't help much ? When stopped?  rec Plan A = Automatic = symbicort 160 Take 2 puffs first thing in am and then another 2 puffs about 12 hours later.  Work on inhaler technique:  relax and gently blow all the way out then take a nice smooth deep breath back in, triggering the inhaler at same time you start breathing in.  Hold for up to 5 seconds if you can. Blow out thru nose. Rinse and gargle with water when done Plan B = Backup Only use your albuterol as a rescue medication Plan C = Crisis - only use your albuterol nebulizer if you first try Plan B and it fails to help > ok to use the nebulizer up to every 4 hours but if start needing it regularly call for immediate appointment       07/05/2016  f/u ov/Zachary Cooper re:  COPD  GOLD III  Chief Complaint  Patient presents with  . Follow-up    Breathing is  "ok". He is coughing some with yellow sputum x 2 days. He is using his albuterol inhaler 3-4 x per day on average. He uses albuterol neb 2-3 x wkly.   last time did HT = one year ago  Has 02 for prn use but only uses p ex, not during Did not bring saba or any meds with him  Fills pill boxes on saturdays / does not know names of meds  No obvious day to day or daytime variability or assoc mucus plugs or hemoptysis or cp or chest tightness, subjective wheeze or overt sinus or hb symptoms. No unusual exp hx or h/o childhood pna/ asthma or knowledge of premature birth.  Sleeping ok without nocturnal  or early am exacerbation  of respiratory  c/o's or need for noct saba. Also denies any obvious fluctuation of symptoms with weather or environmental changes or other aggravating or alleviating factors except as outlined above   Current Medications, Allergies, Complete Past Medical History, Past Surgical History, Family History, and Social History were reviewed in [employer] record.  ROS  The following are not active complaints unless bolded sore throat, dysphagia, dental problems, itching, sneezing,  nasal congestion or excess/ purulent secretions, ear ache,   fever, chills, sweats, unintended wt loss, classically pleuritic or exertional cp,  orthopnea pnd or leg swelling, presyncope, palpitations, abdominal pain, anorexia, nausea, vomiting, diarrhea  or change in bowel or bladder habits, change in stools or urine, dysuria,hematuria,  rash, arthralgias, visual complaints, headache, numbness, weakness or ataxia or problems with walking or coordination,  change in mood/affect or memory.                   Objective:   Physical Exam   Elderly wm much older than stated age/ sits slumped over mumbling responses   07/05/2016        163  03/29/16 162 lb 12.8 oz (73.8 kg)  03/13/16 178 lb 14.4 oz (81.1 kg)    Vital signs reviewed -  - Note on arrival 02 sats  97% on RA      HEENT: nl   turbinates, and oropharynx. Nl external ear canals without cough reflex   NECK :  without JVD/Nodes/TM/ nl carotid upstrokes bilaterally   LUNGS: no acc muscle use,  Nl contour chest with scattered coarse late bilateral exp> insp rhonchi    CV:  RRR  no s3 or murmur or increase in P2, no edema   ABD:  soft and nontender with nl inspiratory excursion in the supine position. No bruits or organomegaly, bowel sounds nl  MS:  Nl gait/ ext warm without deformities, calf tenderness, cyanosis or clubbing No obvious joint restrictions   SKIN: warm and dry without lesions    NEURO:  alert, approp, nl sensorium with  no motor deficits            Assessment & Plan:

## 2016-07-05 NOTE — Assessment & Plan Note (Signed)
07/05/16   Patient Saturations on Room Air at Rest = 93% Patient Saturations on Room Air while Ambulating = 82% Patient Saturations on 2 Liters of oxygen while Ambulating = 94% > rec 2lpm walking and eval for POC

## 2016-07-09 ENCOUNTER — Telehealth: Payer: Self-pay | Admitting: Internal Medicine

## 2016-07-09 NOTE — Telephone Encounter (Signed)
[facility] calling back w/fax# for pt order [phone].Zachary Cooper

## 2016-07-09 NOTE — Telephone Encounter (Signed)
Spoke with pt's caregiver, states that 02 order was sent to [facility] when they had requested it to be sent to [facility].    PCC's please advise if this order can be switched to [facility] at pt request.  Thanks.

## 2016-07-10 NOTE — Telephone Encounter (Signed)
Order sent to [facility] per pt request Zachary Cooper

## 2016-07-12 ENCOUNTER — Ambulatory Visit (HOSPITAL_BASED_OUTPATIENT_CLINIC_OR_DEPARTMENT_OTHER): Payer: Medicare Other | Admitting: Hematology and Oncology

## 2016-07-12 ENCOUNTER — Telehealth: Payer: Self-pay | Admitting: *Deleted

## 2016-07-12 ENCOUNTER — Ambulatory Visit (HOSPITAL_BASED_OUTPATIENT_CLINIC_OR_DEPARTMENT_OTHER): Payer: Medicare Other

## 2016-07-12 ENCOUNTER — Ambulatory Visit: Payer: Medicare Other

## 2016-07-12 ENCOUNTER — Telehealth: Payer: Self-pay | Admitting: Hematology and Oncology

## 2016-07-12 ENCOUNTER — Other Ambulatory Visit (HOSPITAL_BASED_OUTPATIENT_CLINIC_OR_DEPARTMENT_OTHER): Payer: Medicare Other

## 2016-07-12 ENCOUNTER — Other Ambulatory Visit: Payer: Self-pay | Admitting: Hematology and Oncology

## 2016-07-12 ENCOUNTER — Telehealth: Payer: Self-pay | Admitting: Internal Medicine

## 2016-07-12 DIAGNOSIS — D61818 Other pancytopenia: Secondary | ICD-10-CM

## 2016-07-12 DIAGNOSIS — C7801 Secondary malignant neoplasm of right lung: Secondary | ICD-10-CM

## 2016-07-12 DIAGNOSIS — Z5112 Encounter for antineoplastic immunotherapy: Secondary | ICD-10-CM | POA: Diagnosis not present

## 2016-07-12 DIAGNOSIS — J9611 Chronic respiratory failure with hypoxia: Secondary | ICD-10-CM

## 2016-07-12 DIAGNOSIS — E440 Moderate protein-calorie malnutrition: Secondary | ICD-10-CM | POA: Diagnosis not present

## 2016-07-12 DIAGNOSIS — C139 Malignant neoplasm of hypopharynx, unspecified: Secondary | ICD-10-CM | POA: Diagnosis not present

## 2016-07-12 DIAGNOSIS — D509 Iron deficiency anemia, unspecified: Secondary | ICD-10-CM | POA: Insufficient documentation

## 2016-07-12 DIAGNOSIS — D539 Nutritional anemia, unspecified: Secondary | ICD-10-CM

## 2016-07-12 DIAGNOSIS — C7802 Secondary malignant neoplasm of left lung: Secondary | ICD-10-CM

## 2016-07-12 DIAGNOSIS — I2782 Chronic pulmonary embolism: Secondary | ICD-10-CM

## 2016-07-12 DIAGNOSIS — Z95828 Presence of other vascular implants and grafts: Secondary | ICD-10-CM

## 2016-07-12 DIAGNOSIS — Z5111 Encounter for antineoplastic chemotherapy: Secondary | ICD-10-CM

## 2016-07-12 LAB — COMPREHENSIVE METABOLIC PANEL
ALBUMIN: 3.4 g/dL — AB (ref 3.5–5.0)
ALK PHOS: 69 U/L (ref 40–150)
ALT: 18 U/L (ref 0–55)
AST: 23 U/L (ref 5–34)
Anion Gap: 9 mEq/L (ref 3–11)
BUN: 40.3 mg/dL — AB (ref 7.0–26.0)
CO2: 23 meq/L (ref 22–29)
Calcium: 9.5 mg/dL (ref 8.4–10.4)
Chloride: 105 mEq/L (ref 98–109)
Creatinine: 0.9 mg/dL (ref 0.7–1.3)
EGFR: >90 mL/min/{1.73_m2} (ref >90)
GLUCOSE: 85 mg/dL (ref 70–140)
POTASSIUM: 3.8 meq/L (ref 3.5–5.1)
SODIUM: 137 meq/L (ref 136–145)
TOTAL PROTEIN: 7.9 g/dL (ref 6.4–8.3)
Total Bilirubin: 0.35 mg/dL (ref 0.20–1.20)

## 2016-07-12 LAB — CBC WITH DIFFERENTIAL/PLATELET
BASO%: 0.2 % (ref 0.0–2.0)
Basophils Absolute: 0 10*3/uL (ref 0.0–0.1)
EOS%: 1.8 % (ref 0.0–7.0)
Eosinophils Absolute: 0.1 10*3/uL (ref 0.0–0.5)
HCT: 30.6 % — ABNORMAL LOW (ref 38.4–49.9)
HEMOGLOBIN: 10.1 g/dL — AB (ref 13.0–17.1)
LYMPH%: 14.8 % (ref 14.0–49.0)
MCH: 24.3 pg — AB (ref 27.2–33.4)
MCHC: 33 g/dL (ref 32.0–36.0)
MCV: 73.7 fL — ABNORMAL LOW (ref 79.3–98.0)
MONO#: 0.4 10*3/uL (ref 0.1–0.9)
MONO%: 6.7 % (ref 0.0–14.0)
NEUT%: 76.5 % — ABNORMAL HIGH (ref 39.0–75.0)
NEUTROS ABS: 4.8 10*3/uL (ref 1.5–6.5)
Platelets: 220 10*3/uL (ref 140–400)
RBC: 4.15 10*6/uL — AB (ref 4.20–5.82)
RDW: 17.2 % — AB (ref 11.0–14.6)
WBC: 6.3 10*3/uL (ref 4.0–10.3)
lymph#: 0.9 10*3/uL (ref 0.9–3.3)

## 2016-07-12 LAB — FERRITIN: Ferritin: 27 ng/ml (ref 22–316)

## 2016-07-12 LAB — IRON AND TIBC
%SAT: 4 % — AB (ref 20–55)
Iron: 16 ug/dL — ABNORMAL LOW (ref 42–163)
TIBC: 418 ug/dL — ABNORMAL HIGH (ref 202–409)
UIBC: 401 ug/dL — AB (ref 117–376)

## 2016-07-12 LAB — TSH: TSH: 0.97 m(IU)/L (ref 0.320–4.118)

## 2016-07-12 MED ORDER — SODIUM CHLORIDE 0.9% FLUSH
10.0000 mL | INTRAVENOUS | Status: DC | PRN
Start: 2016-07-12 — End: 2016-07-12
  Administered 2016-07-12: 10 mL via INTRAVENOUS
  Filled 2016-07-12: qty 10

## 2016-07-12 MED ORDER — OXYCODONE HCL 10 MG PO TABS: 10.0000 mg | ORAL_TABLET | ORAL | 0 refills | Status: DC | PRN

## 2016-07-12 MED ORDER — SODIUM CHLORIDE 0.9% FLUSH
10.0000 mL | INTRAVENOUS | Status: DC | PRN
  Administered 2016-07-12: 10 mL
  Filled 2016-07-12: qty 10

## 2016-07-12 MED ORDER — HEPARIN SOD (PORK) LOCK FLUSH 100 UNIT/ML IV SOLN
500.0000 [IU] | Freq: Once | INTRAVENOUS | Status: AC | PRN
  Administered 2016-07-12: 500 [IU]
  Filled 2016-07-12: qty 5

## 2016-07-12 MED ORDER — HEPARIN SOD (PORK) LOCK FLUSH 100 UNIT/ML IV SOLN
500.0000 [IU] | Freq: Once | INTRAVENOUS | Status: AC
  Administered 2016-07-12: 500 [IU] via INTRAVENOUS
  Filled 2016-07-12: qty 5

## 2016-07-12 MED ORDER — PREDNISONE 10 MG PO TABS: 10.0000 mg | ORAL_TABLET | Freq: Every day | ORAL | 11 refills | Status: DC

## 2016-07-12 MED ORDER — SODIUM CHLORIDE 0.9 % IV SOLN
Freq: Once | INTRAVENOUS | Status: AC
  Administered 2016-07-12: 16:00:00 via INTRAVENOUS

## 2016-07-12 MED ORDER — SODIUM CHLORIDE 0.9 % IV SOLN
200.0000 mg | Freq: Once | INTRAVENOUS | Status: AC
  Administered 2016-07-12: 200 mg via INTRAVENOUS
  Filled 2016-07-12: qty 8

## 2016-07-12 NOTE — Telephone Encounter (Signed)
Message sent to chemo scheduler to be added per 07/12/16 los. Appointments scheduled per 07/12/16 los. Patient was given a copy of the AVS report and appointment schedule, per 07/12/16 los.

## 2016-07-12 NOTE — Telephone Encounter (Signed)
Notified of appt for iron infusion at [facility] on 07/18/16 @ 1230

## 2016-07-12 NOTE — Telephone Encounter (Signed)
Spoke with pt's caregiver, who states pt had O2 with [facility] previously, but after moving to [place] in August of 2017 pt switched to [place]. Pt is currently getting  O2 with [facility] and request that a order be placed to [place]. I see where a order was placed on 07/05/16 to [facility]. I also see a phone note on 07/09/16, where caregiver states order needed to be placed to [facility] instead of [place]. I have spoke with Raven with [facility], who confirmed pt is with them.  [facility] please advise if we can switch order to [place] per pt request. Thanks.

## 2016-07-12 NOTE — Telephone Encounter (Signed)
Per 2/1 LOS and staff message I have scheduled appts and gave calendar

## 2016-07-12 NOTE — Patient Instructions (Signed)
[facility] Discharge Instructions for Patients Receiving Chemotherapy  Today you received the following chemotherapy agents: Keytruda   To help prevent nausea and vomiting after your treatment, we encourage you to take your nausea medication as directed    If you develop nausea and vomiting that is not controlled by your nausea medication, call the clinic.   BELOW ARE SYMPTOMS THAT SHOULD BE REPORTED IMMEDIATELY:  *FEVER GREATER THAN 100.5 F  *CHILLS WITH OR WITHOUT FEVER  NAUSEA AND VOMITING THAT IS NOT CONTROLLED WITH YOUR NAUSEA MEDICATION  *UNUSUAL SHORTNESS OF BREATH  *UNUSUAL BRUISING OR BLEEDING  TENDERNESS IN MOUTH AND THROAT WITH OR WITHOUT PRESENCE OF ULCERS  *URINARY PROBLEMS  *BOWEL PROBLEMS  UNUSUAL RASH Items with * indicate a potential emergency and should be followed up as soon as possible.  Feel free to call the clinic you have any questions or concerns. The clinic phone number is [phone].  Please show the CHEMO ALERT CARD at check-in to the Emergency Department and triage nurse.   

## 2016-07-12 NOTE — Telephone Encounter (Signed)
Attempted to contact pt. But was unable to leave a vm, will try tomorrow

## 2016-07-12 NOTE — Telephone Encounter (Signed)
I have resent order to [facility].

## 2016-07-13 ENCOUNTER — Encounter: Payer: Self-pay | Admitting: Hematology and Oncology

## 2016-07-13 LAB — VITAMIN B12: Vitamin B12: 470 pg/mL (ref 232–1245)

## 2016-07-13 NOTE — Assessment & Plan Note (Signed)
The patient is noted to have iron deficiency anemia, could be due to minor GI bleed from chronic anticoagulation therapy. I recommend intravenous iron therapy next week.

## 2016-07-13 NOTE — Progress Notes (Signed)
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 No lymphadenopathy in the neck. A CT of the chest on 08/23/13 showed bullous emphysema and 2 nodules possibly representing metastatic disease including a 1.4cm left upper lobe nodule and a 1.4cm nodule in the right middle lobe.              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 Bilateral hypermetabolic pulmonary nodules are concerning for bilateral lung metastases.      09/14/2013 - 11/09/2013 Chemotherapy    The patient had weekly cisplatin x 3 with radiation at [facility]. Chemo was stopped due to intolerable toxicities       09/14/2013 - 10/12/2013 Radiation Therapy    He received radiation therapy at [facility] : [facility]. IMRT was used to treat the primary tumor and regional lymphatics to 4600cGy in 200cGy daily fractions over 23 days followed by a boost of 2400cGy in 12 days to a total dose of 7000cGy.        09/22/2013 Procedure    He has placement of port       09/29/2013 Procedure    He has placement of feeding tube      10/12/2013 Procedure    He has removal of port      10/17/2013 Imaging    Ct neck showed thrombus is present in the jugular vein from the skull base to the brachiocephalic vein. A small venous branch or collateral also demonstrates thrombus in the anterior right neck. The SVC is patent. The right-sided  laryngeal mass is smaller with decreased but persistent right to left mass effect on the airway. Severe atherosclerotic changes at the bifurcation of the carotid arteries on the left, greater than right. In the dermis of the upper anterior right chest wall there is a small fluid and air collection measuring 2.3 x 1.1 cm.      10/17/2013 Imaging    Ct chest showed The SVC is patent however there is thrombus extending throughout the jugular vein to just below the brachiocephalic vein. There is also likely thrombus and a small peripheral vein in the anterior right neck. Several pulmonary nodules, one larger nodule appears smaller with another larger nodule stable and several new small pulmonary nodules. Follow-up recommended. New mild right lower lobe airspace disease with several nodular opacities. This may represent infection. No pleural effusion.      01/01/2014 Imaging    Ct chest showed Interval improvement in heterogeneous right lower lobe opacity, likely improving infection/aspiration. Previously visualized FDG avid left upper lobe nodule is less solid compared to most recent prior but not significantly changed in size. Previously visualized FDG avid right middle lobe nodule has increased in size compared to most recent prior, likely disease progression. Significant interval increase in size of left upper lobe nodules consistent with progressive metastatic disease. Other smaller previously visualized nodules are not visualized and were likely  inflammatory/infectious in etiology.      01/01/2014 Imaging    Ct neck showed thrombus present in the jugular vein from skull base to brachiocephalic vein, the SVC remains patent. Extensive atherosclerotic calcifications of the carotid bifurcations left greater than right the mass in the hypopharynx. Improved appearance of the hypopharyngeal mass with less edema and last less shift to the left. Less edema and fluid within the adjacent soft tissues also  demonstrated. Increased fluid in the retropharyngeal prevertebral soft tissues.      04/15/2014 Imaging    CT pulmonary arteriogram demonstrates no evidence of pulmonary embolism. Bilateral pulmonary lesions demonstrated enlarged since the prior study. A new lesion is also seen within the lingula. Findings would be typical of pulmonary metastatic lesions. Focal parenchymal infiltrate demonstrated in the right lower lobe. Left basilar atelectasis. Middle lobe infiltrate or atelectasis demonstrated with underlying bronchiectasis. Lingular infiltrate or atelectasis also      05/03/2014 Imaging    Ct chest showed interval enlargement of at least 2 pulmonary nodules as described above. Findings are suspicious for worsening disease.  Interval development of consolidation within the lateral segment right middle lobe could be related to a filling defect in a branch of the lateral segment right middle lobe bronchus. The consolidation favored to represent atelectasis. The endobronchial focus could represent mucoid impactionor possibly a fixed endobronchial lesion. Aspiration would be less likely given its location in the right middle lobe.      05/03/2014 Imaging    Ct neck showed no significant change in right hypopharyngeal mass. Stable airway asymmetry. Ill-defined soft tissue along the carotid space carotid encasement mildly improved. Multispatial soft tissue stranding/fluid has improved. Right internal jugular vein not visualized..      05/13/2014 Procedure    Tracheostomy was removed         06/16/2014 - 03/16/2015 Chemotherapy    The patient had carboplatin AUC 2 and Taxol 45 mg/m2 at [facility]       06/16/2014 Imaging    Ct chest showed stable dominant nodules from recent prior. Presumably, metastatic disease. New increased groundglass attenuation opacities, right lower lobe, centrilobular and clumped nodules, as well as a more dominant nodular opacity. While these findings could be secondary to  progressive metastatic disease, given their location and appearance, aspiration pneumonia/pneumonitis is also possible.      06/21/2014 Imaging    Ct abdomen showed gastrostomy tube is positioned within the stomach. No evidence for an acute inflammatory process within the abdomen or pelvis. No large or small bowel obstruction. No change right lower lobe airspace disease and nodular opacities      09/30/2014 Imaging    Ct chest showed interval decrease in size of metastatic pulmonary nodules. One of these nodules demonstrates new cavitation within the right upper lobe. Interval improvement of nodular opacities within the right lower lobe suggestive of a resolving aspiration.      04/01/2015 Imaging    Ct chest showed small amount of right upper lobe acute, nonocclusive pulmonary embolus. Enlarged left upper lobe pulmonary metastasis. Mixed response with resolved previous smaller nodule in the lingula. Right middle and right lower lobe appearance likely represents posttreatment cicatrization atelectasis (query if radiation therapy has been performed in this region). Worsened right infrahilar bronchial wall thickening with occlusion of a subsegmental branch. No evidence for postobstructive pneumonia. Paraseptal emphysema.      04/19/2015 - 02/29/2016 Chemotherapy    The patient had Keytruda at [facility].  He subsequently relocated to [facility]  04/29/2015 Imaging    Ct neck showed interval significant reduction in size of previous large right supraglottic mass. Airway is now widely patent at this level. No definite discrete supraglottic or pharyngeal mass demonstrated currently by CT. Left upper lobe spiculated mass. Prominent calcified atherosclerotic plaquing involving the carotid bifurcations and carotid bulbs greater on the left resulting in stenosis of the internal carotid artery origins. Consider follow-up carotid Doppler ultrasound exam for further evaluation.      07/13/2015 Imaging    Ct  neck showed expected post-treatment changes in the neck without evidence of recurrent disease in the primary site. No abnormal lymph nodes. Significant biapical bullous disease /paraseptal emphysema in the lungs, unchanged from prior. Previously seen left upper lobe nodular mass is visualized on the same-day CT chest.       07/13/2015 Imaging    Ct chest showed interval decrease in size of a spiculated left upper lobe nodule. Numerous additional nonspecific pulmonary nodules of the bilateral lungs, which are stable compared to prior examination. Clustered tree-in-bud nodules of the right lung base are likely sequelae of Aspiration. Bronchiectasis and atelectasis of the right middle lobe. Emphysema.      08/24/2015 Miscellaneous    He received IV feraheme      09/20/2015 Imaging    CT scan showed acute, comminuted, medial right clavicular fracture and marked, overlying soft tissue swelling. Mild, acute, T10 compression fracture.  The known left upper lobe carcinoma has modestly decreased in size since the 04/01/2015 comparison. Perhaps there has been a response to interim therapy. Right middle lobe and lower lobe bronchiectasis and pleuroparenchymal scarring were also present on the 04/01/2015 comparison. Confluent consolidation, however, is less striking than on the prior. Perhaps this is residua of prior pneumonia or radiation. Marked bullous emphysema is unchanged. Advanced, 3-vessel coronary artery disease. The liver is unusual in shape, but the findings are not definitive for cirrhosis. The appearance is unchanged since 08/19/2014.      09/20/2015 Imaging    Ct head showed mild patchy low-density present in the periventricular white matter. No mass, hemorrhage, or extracerebral fluid.  Mild supratentorial ventricle and sulcal prominence within normal limits.  A component of diffuse cerebellar atrophy is present. Carotid siphon and distal left vertebral vascular calcifications are present. On bone  windows, no calvarial lesion. Normal mastoid aeration. Mucosal thickening left posterior ethmoid air cells and retention cyst left maxillary sinus.      10/05/2015 Imaging    CT chest, abdomen and pelvis showed increasing size of spiculated nodule in the left upper lobe. Increasing adjacent groundglass opacities are concerning for lymphangitic spread of tumor. Additional pulmonary nodules are unchanged. Multiple mildly enlarged mesenteric lymph nodes, similar to prior. Sclerotic changes and height loss of the T10 vertebral body, new from prior. This is concerning for osseous metastatic disease. Extensive atherosclerotic disease at the origin of the celiac and throughout the proximal superior mesenteric artery.      10/05/2015 Imaging    Ct neck showed decreased supraglottic edema, with residual asymmetric fullness right greater than left. Stable post-therapeutic retropharyngeal edema. Comminuted fracture of the proximal right clavicle/clavicular head with surrounding soft tissue and pectoral stranding, new in the interval. Findings could be posttraumatic and/or pathologic.      11/16/2015 Imaging    Ct chest showed stable dominant left upper lobe nodule, and surrounding opacities which may be related to radiation. New 10-14-mm left upper lobe nodule, anterior to dominant nodule and radiation change. While this could also represent radiation  change, infection or neoplasm are in the differential diagnosis      12/27/2015 Imaging    Ct chest showed stable to slight decrease in nodular consolidative opacities, which are likely related to prior therapy. No new abnormalities      01/31/2016 Imaging    CT brain at [facility] showed no metastatic disease. No acute intracranial process.      03/22/2016 -  Chemotherapy    He receives Keytruda       04/11/2016 Imaging    Ct chest showed 15 x 10 mm left upper lobe nodule, mildly decreased and likely reflecting improving metastasis, with surrounding radiation  changes. Additional multifocal patchy opacities in the left lower lobe, favored to reflect radiation changes, less likely infection.      04/11/2016 Imaging    Ct neck showed regressed diffuse pharyngeal mucosal space soft tissue thickening since April 2017, likely sequelae of radiation. No residual pharyngeal mass or lymphadenopathy identified. Pathologic appearing fracture of the medial right clavicle redemonstrated, with some interval periosteal new bone formation but no solid osseous union. No osseous metastatic disease identified in the neck.       INTERVAL HISTORY: Please see below for problem oriented charting. He returns for further chemotherapy with his caregiver. He feels well. No recent cough. Breathing has improved. Appetite is better and he is gaining weight. The patient denies any recent signs or symptoms of bleeding such as spontaneous epistaxis, hematuria or hematochezia.   REVIEW OF SYSTEMS:   Constitutional: Denies fevers, chills or abnormal weight loss Eyes: Denies blurriness of vision Ears, nose, mouth, throat, and face: Denies mucositis or sore throat Respiratory: Denies cough, dyspnea or wheezes Cardiovascular: Denies palpitation, chest discomfort or lower extremity swelling Gastrointestinal:  Denies nausea, heartburn or change in bowel habits Skin: Denies abnormal skin rashes Lymphatics: Denies new lymphadenopathy or easy bruising Neurological:Denies numbness, tingling or new weaknesses Behavioral/Psych: Mood is stable, no new changes  All other systems were reviewed with the patient and are negative.  I have reviewed the past medical history, past surgical history, social history and family history with the patient and they are unchanged from previous note.  ALLERGIES:  is allergic to lisinopril.  MEDICATIONS:  Current Outpatient Prescriptions  Medication Sig Dispense Refill  . albuterol (ACCUNEB) 1.25 MG/3ML nebulizer solution Take 3 mLs (1.25 mg total)  by nebulization every 6 (six) hours as needed for wheezing or shortness of breath. 360 mL 0  . albuterol (PROVENTIL HFA;VENTOLIN HFA) 108 (90 Base) MCG/ACT inhaler Inhale 2 puffs into the lungs every 4 (four) hours as needed for wheezing or shortness of breath. 18 g 0  . budesonide-formoterol (SYMBICORT) 160-4.5 MCG/ACT inhaler Inhale 2 puffs into the lungs 2 (two) times daily. 1 Inhaler 6  . diltiazem (CARDIZEM CD) 180 MG 24 hr capsule Take 1 capsule (180 mg total) by mouth daily. 30 capsule 0  . gabapentin (NEURONTIN) 100 MG capsule Take 1 capsule (100 mg total) by mouth at bedtime. 30 capsule 0  . guaiFENesin-codeine (ROBITUSSIN AC) 100-10 MG/5ML syrup Take by mouth.    . isosorbide dinitrate (ISORDIL) 20 MG tablet Take 1 tablet (20 mg total) by mouth 2 (two) times daily. 60 tablet 0  . lidocaine-prilocaine (EMLA) cream Apply 1 application topically as needed. Apply to [facility] a Cath site at least one hour prior to needle stick 30 g 3  . magnesium oxide (MAG-OX) 400 MG tablet Take 1 tablet (400 mg total) by mouth daily. 30 tablet 0  . mirtazapine (REMERON)  15 MG tablet Take 1 tablet (15 mg total) by mouth at bedtime. 30 tablet 1  . nitroGLYCERIN (NITROSTAT) 0.3 MG SL tablet Place 1 tablet (0.3 mg total) under the tongue every 5 (five) minutes as needed for chest pain. 90 tablet 12  . OLANZapine (ZYPREXA) 5 MG tablet Take 1 tablet (5 mg total) by mouth at bedtime. 30 tablet 0  . ondansetron (ZOFRAN-ODT) 8 MG disintegrating tablet Take 1 tablet (8 mg total) by mouth every 8 (eight) hours as needed for nausea or vomiting. 30 tablet 0  . Oxycodone HCl 10 MG TABS Take 1 tablet (10 mg total) by mouth every 4 (four) hours as needed. 90 tablet 0  . pantoprazole (PROTONIX) 40 MG tablet Take 1 tablet (40 mg total) by mouth daily. 30 tablet 0  . predniSONE (DELTASONE) 10 MG tablet Take 1 tablet (10 mg total) by mouth daily with breakfast. 30 tablet 11  . prochlorperazine (COMPAZINE) 10 MG tablet Take 1 tablet  (10 mg total) by mouth every 6 (six) hours as needed for nausea or vomiting. 90 tablet 6  . rivaroxaban (XARELTO) 20 MG TABS tablet Take 1 tablet (20 mg total) by mouth daily with supper. 30 tablet 0   No current facility-administered medications for this visit.     PHYSICAL EXAMINATION: ECOG PERFORMANCE STATUS: 1 - Symptomatic but completely ambulatory  Vitals:   07/12/16 1318  BP: (!) 102/55  Pulse: 89  Resp: 17  Temp: 98.4 F (36.9 C)   Filed Weights   07/12/16 1318  Weight: 169 lb 3.2 oz (76.7 kg)    GENERAL:alert, no distress and comfortable SKIN: skin color, texture, turgor are normal, no rashes or significant lesions EYES: normal, Conjunctiva are pink and non-injected, sclera clear OROPHARYNX:no exudate, no erythema and lips, buccal mucosa, and tongue normal  NECK: supple, thyroid normal size, non-tender, without nodularity LYMPH:  no palpable lymphadenopathy in the cervical, axillary or inguinal LUNGS: clear to auscultation and percussion with normal breathing effort HEART: regular rate & rhythm and no murmurs and no lower extremity edema ABDOMEN:abdomen soft, non-tender and normal bowel sounds Musculoskeletal:no cyanosis of digits and no clubbing  NEURO: alert & oriented x 3 with fluent speech, no focal motor/sensory deficits  LABORATORY DATA:  I have reviewed the data as listed    Component Value Date/Time   NA 137 07/12/2016 1257   K 3.8 07/12/2016 1257   CL 99 (L) 06/13/2016 0935   CO2 23 07/12/2016 1257   GLUCOSE 85 07/12/2016 1257   BUN 40.3 (H) 07/12/2016 1257   CREATININE 0.9 07/12/2016 1257   CALCIUM 9.5 07/12/2016 1257   PROT 7.9 07/12/2016 1257   ALBUMIN 3.4 (L) 07/12/2016 1257   AST 23 07/12/2016 1257   ALT 18 07/12/2016 1257   ALKPHOS 69 07/12/2016 1257   BILITOT 0.35 07/12/2016 1257   GFRNONAA >60 06/13/2016 0935   GFRAA >60 06/13/2016 0935    No results found for: SPEP, UPEP  Lab Results  Component Value Date   WBC 6.3 07/12/2016    NEUTROABS 4.8 07/12/2016   HGB 10.1 (L) 07/12/2016   HCT 30.6 (L) 07/12/2016   MCV 73.7 (L) 07/12/2016   PLT 220 07/12/2016      Chemistry      Component Value Date/Time   NA 137 07/12/2016 1257   K 3.8 07/12/2016 1257   CL 99 (L) 06/13/2016 0935   CO2 23 07/12/2016 1257   BUN 40.3 (H) 07/12/2016 1257   CREATININE 0.9 07/12/2016 1257  Component Value Date/Time   CALCIUM 9.5 07/12/2016 1257   ALKPHOS 69 07/12/2016 1257   AST 23 07/12/2016 1257   ALT 18 07/12/2016 1257   BILITOT 0.35 07/12/2016 1257       RADIOGRAPHIC STUDIES: I have personally reviewed the radiological images as listed and agreed with the findings in the report. 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 EXAM: FLUOROSCOPIC AND ULTRASOUND GUIDED PLACEMENT OF A SUBCUTANEOUS PORT. Physician: Stephan Minister. Anselm Pancoast, MD MEDICATIONS: Ancef 2 g; As antibiotic prophylaxis, Ancef was ordered pre-procedure and administered intravenously within one hour of incision. ANESTHESIA/SEDATION: Versed 3.5 mg IV; Fentanyl 175 mcg IV; Moderate Sedation Time:  43 minutes The patient was continuously monitored during the procedure by the interventional radiology nurse under my direct supervision. FLUOROSCOPY TIME:  1 minutes, 6 mGy COMPLICATIONS: None immediate. CONTRAST:  1 mL Isovue-300 PROCEDURE: The risks of the procedure were explained to the patient. Informed consent was obtained. Patient was placed supine on the interventional table. 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 Ultrasound confirmed a patent left internal jugular vein. The left chest and neck were cleaned with a skin antiseptic and a sterile drape was placed. Maximal barrier sterile technique was utilized including caps, mask, sterile gowns, sterile gloves, sterile drape, hand hygiene and skin antiseptic. The left neck was anesthetized with 1%  lidocaine. Small incision was made in the left neck with a blade. Micropuncture set was placed in the left IJ with ultrasound guidance. The micropuncture wire was used for measurement purposes. The left chest was anesthetized with 1% lidocaine with epinephrine. #15 blade was used to make an incision and a subcutaneous port pocket was formed. [place] was assembled. Subcutaneous tunnel was formed with a stiff tunneling device. The port catheter was brought through the subcutaneous tunnel. The port was placed in the subcutaneous pocket. The micropuncture set was exchanged for a peel-away sheath. The catheter was placed through the peel-away sheath. Fluoroscopy demonstrated the catheter was in the azygos vein. The catheter would not move with flushing. Therefore, the catheter was removed from the pocket and disconnected form the port. The catheter was repositioned in the SVC with a Bentson wire. The port was then re- attached to the catheter and placed back in the pocket. Small amount of dilute contrast was injected through the port to confirm no leakage and no discontinuity. Catheter positioned at the superior cavoatrial junction. Catheter placement was confirmed with fluoroscopy. The port was accessed and flushed with heparinized saline. The port pocket was closed using two layers of absorbable sutures and Dermabond. The vein skin site was closed using a single layer of absorbable suture and Dermabond. Sterile dressings were applied. Patient tolerated the procedure well without an immediate complication. Ultrasound and fluoroscopic images were taken and saved for this procedure. IMPRESSION: Placement of a subcutaneous port device. Catheter tip at the superior cavoatrial junction. Electronically Signed   By: Markus Daft M.D.   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 EXAM: FLUOROSCOPIC AND ULTRASOUND  GUIDED PLACEMENT OF A SUBCUTANEOUS PORT. Physician: Stephan Minister. Anselm Pancoast, MD MEDICATIONS: Ancef 2 g; As antibiotic prophylaxis, Ancef was ordered pre-procedure and administered intravenously within one hour of incision. ANESTHESIA/SEDATION: Versed 3.5 mg IV; Fentanyl 175 mcg IV; Moderate Sedation Time:  43 minutes The patient was continuously monitored during the  procedure by the interventional radiology nurse under my direct supervision. FLUOROSCOPY TIME:  1 minutes, 6 mGy COMPLICATIONS: None immediate. CONTRAST:  1 mL Isovue-300 PROCEDURE: The risks of the procedure were explained to the patient. Informed consent was obtained. Patient was placed supine on the interventional table. 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 Ultrasound confirmed a patent left internal jugular vein. The left chest and neck were cleaned with a skin antiseptic and a sterile drape was placed. Maximal barrier sterile technique was utilized including caps, mask, sterile gowns, sterile gloves, sterile drape, hand hygiene and skin antiseptic. The left neck was anesthetized with 1% lidocaine. Small incision was made in the left neck with a blade. Micropuncture set was placed in the left IJ with ultrasound guidance. The micropuncture wire was used for measurement purposes. The left chest was anesthetized with 1% lidocaine with epinephrine. #15 blade was used to make an incision and a subcutaneous port pocket was formed. [place] was assembled. Subcutaneous tunnel was formed with a stiff tunneling device. The port catheter was brought through the subcutaneous tunnel. The port was placed in the subcutaneous pocket. The micropuncture set was exchanged for a peel-away sheath. The catheter was placed through the peel-away sheath. Fluoroscopy demonstrated the catheter was in the azygos vein. The catheter would not move with flushing. Therefore, the catheter was removed from the pocket  and disconnected form the port. The catheter was repositioned in the SVC with a Bentson wire. The port was then re- attached to the catheter and placed back in the pocket. Small amount of dilute contrast was injected through the port to confirm no leakage and no discontinuity. Catheter positioned at the superior cavoatrial junction. Catheter placement was confirmed with fluoroscopy. The port was accessed and flushed with heparinized saline. The port pocket was closed using two layers of absorbable sutures and Dermabond. The vein skin site was closed using a single layer of absorbable suture and Dermabond. Sterile dressings were applied. Patient tolerated the procedure well without an immediate complication. Ultrasound and fluoroscopic images were taken and saved for this procedure. IMPRESSION: Placement of a subcutaneous port device. Catheter tip at the superior cavoatrial junction. Electronically Signed   By: Markus Daft M.D.   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 His most recent CT scan in October 2017 showed a positive response to treatment We will continue treatment every 3 weeks as scheduled and a plan to repeat another imaging study due March 2018   Iron deficiency anemia The patient is noted to have iron deficiency anemia, could be due to minor GI bleed from chronic anticoagulation therapy. I recommend intravenous iron therapy next week.  Pulmonary embolism ([place]) He had history of DVT and PE. He is on chronic anticoagulation therapy. Despite mild iron deficiency anemia, I felt that the benefit of chronic anticoagulation therapy outweigh the risks.  Protein-calorie malnutrition, moderate ([place]) The patient continues to gain weight on prednisone. His caregiver inquire about the use of Marinol. The  patient has complained about insomnia while on prednisone and I would not recommend Marinol for now as it is working well.  Chronic respiratory failure ([facility]) His respiratory failure has improved while on chronic prednisone therapy. Continue oxygen and other inhalers as directed by his pulmonologist.   No  orders of the defined types were placed in this encounter.  All questions were answered. The patient knows to call the clinic with any problems, questions or concerns. No barriers to learning was detected. I spent 20 minutes counseling the patient face to face. The total time spent in the appointment was 30 minutes and more than 50% was on counseling and review of test results     Heath Lark, MD 07/13/2016 9:36 AM

## 2016-07-13 NOTE — Telephone Encounter (Signed)
[facility] [place] is made aware that order has been placed to [facility]. Nothing further needed.

## 2016-07-13 NOTE — Assessment & Plan Note (Signed)
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 His most recent CT scan in October 2017 showed a positive response to treatment We will continue treatment every 3 weeks as scheduled and a plan to repeat another imaging study due March 2018

## 2016-07-13 NOTE — Assessment & Plan Note (Signed)
He had history of DVT and PE. He is on chronic anticoagulation therapy. Despite mild iron deficiency anemia, I felt that the benefit of chronic anticoagulation therapy outweigh the risks.

## 2016-07-13 NOTE — Assessment & Plan Note (Signed)
The patient continues to gain weight on prednisone. His caregiver inquire about the use of Marinol. The patient has complained about insomnia while on prednisone and I would not recommend Marinol for now as it is working well.

## 2016-07-13 NOTE — Assessment & Plan Note (Signed)
His respiratory failure has improved while on chronic prednisone therapy. Continue oxygen and other inhalers as directed by his pulmonologist.

## 2016-07-18 ENCOUNTER — Ambulatory Visit (HOSPITAL_BASED_OUTPATIENT_CLINIC_OR_DEPARTMENT_OTHER): Payer: Medicare Other

## 2016-07-18 ENCOUNTER — Encounter: Payer: Self-pay | Admitting: *Deleted

## 2016-07-18 VITALS — BP 103/60 | HR 85 | Temp 97.5°F | Resp 20

## 2016-07-18 DIAGNOSIS — D509 Iron deficiency anemia, unspecified: Secondary | ICD-10-CM

## 2016-07-18 DIAGNOSIS — C139 Malignant neoplasm of hypopharynx, unspecified: Secondary | ICD-10-CM

## 2016-07-18 MED ORDER — HEPARIN SOD (PORK) LOCK FLUSH 100 UNIT/ML IV SOLN
500.0000 [IU] | Freq: Once | INTRAVENOUS | Status: AC | PRN
  Administered 2016-07-18: 500 [IU]
  Filled 2016-07-18: qty 5

## 2016-07-18 MED ORDER — SODIUM CHLORIDE 0.9 % IV SOLN
510.0000 mg | Freq: Once | INTRAVENOUS | Status: AC
  Administered 2016-07-18: 510 mg via INTRAVENOUS
  Filled 2016-07-18: qty 17

## 2016-07-18 MED ORDER — SODIUM CHLORIDE 0.9% FLUSH
10.0000 mL | INTRAVENOUS | Status: DC | PRN
  Administered 2016-07-18: 10 mL
  Filled 2016-07-18: qty 10

## 2016-07-18 MED ORDER — SODIUM CHLORIDE 0.9 % IV SOLN
Freq: Once | INTRAVENOUS | Status: AC
  Administered 2016-07-18: 14:00:00 via INTRAVENOUS

## 2016-07-18 NOTE — Patient Instructions (Signed)
Ferumoxytol injection What is this medicine? FERUMOXYTOL is an iron complex. Iron is used to make healthy red blood cells, which carry oxygen and nutrients throughout the body. This medicine is used to treat iron deficiency anemia in people with chronic kidney disease. COMMON BRAND NAME(S): Feraheme What should I tell my health care provider before I take this medicine? They need to know if you have any of these conditions: -anemia not caused by low iron levels -high levels of iron in the blood -magnetic resonance imaging (MRI) test scheduled -an unusual or allergic reaction to iron, other medicines, foods, dyes, or preservatives -pregnant or trying to get pregnant -breast-feeding How should I use this medicine? This medicine is for injection into a vein. It is given by a health care professional in a hospital or clinic setting. Talk to your pediatrician regarding the use of this medicine in children. Special care may be needed. What if I miss a dose? It is important not to miss your dose. Call your doctor or health care professional if you are unable to keep an appointment. What may interact with this medicine? This medicine may interact with the following medications: -other iron products What should I watch for while using this medicine? Visit your doctor or healthcare professional regularly. Tell your doctor or healthcare professional if your symptoms do not start to get better or if they get worse. You may need blood work done while you are taking this medicine. You may need to follow a special diet. Talk to your doctor. Foods that contain iron include: whole grains/cereals, dried fruits, beans, or peas, leafy green vegetables, and organ meats (liver, kidney). What side effects may I notice from receiving this medicine? Side effects that you should report to your doctor or health care professional as soon as possible: -allergic reactions like skin rash, itching or hives, swelling of the  face, lips, or tongue -breathing problems -changes in blood pressure -feeling faint or lightheaded, falls -fever or chills -flushing, sweating, or hot feelings -swelling of the ankles or feet Side effects that usually do not require medical attention (report to your doctor or health care professional if they continue or are bothersome): -diarrhea -headache -nausea, vomiting -stomach pain Where should I keep my medicine? This drug is given in a hospital or clinic and will not be stored at home.  2017 Elsevier/Gold Standard (2015-06-30 12:41:49)  

## 2016-07-19 ENCOUNTER — Telehealth: Payer: Self-pay | Admitting: *Deleted

## 2016-07-19 NOTE — Telephone Encounter (Signed)
Called pt with instructions to contact pulmonology team for inhaler refill- per Dr. Alvy Bimler. Pt requested I contact Margie Ege with this information.  Attempted to call Cathy at [phone]. No answer, no voicemail. Notified [place] that refill requests should be sent to pulmonology.

## 2016-08-02 ENCOUNTER — Telehealth: Payer: Self-pay | Admitting: Hematology and Oncology

## 2016-08-02 ENCOUNTER — Ambulatory Visit (HOSPITAL_BASED_OUTPATIENT_CLINIC_OR_DEPARTMENT_OTHER): Payer: Medicare Other | Admitting: Hematology and Oncology

## 2016-08-02 ENCOUNTER — Encounter: Payer: Self-pay | Admitting: Hematology and Oncology

## 2016-08-02 ENCOUNTER — Ambulatory Visit: Payer: Medicare Other

## 2016-08-02 ENCOUNTER — Other Ambulatory Visit (HOSPITAL_BASED_OUTPATIENT_CLINIC_OR_DEPARTMENT_OTHER): Payer: Medicare Other

## 2016-08-02 ENCOUNTER — Ambulatory Visit (HOSPITAL_BASED_OUTPATIENT_CLINIC_OR_DEPARTMENT_OTHER): Payer: Medicare Other

## 2016-08-02 ENCOUNTER — Telehealth: Payer: Self-pay | Admitting: *Deleted

## 2016-08-02 ENCOUNTER — Other Ambulatory Visit: Payer: Self-pay | Admitting: Medical Oncology

## 2016-08-02 VITALS — BP 95/62 | HR 119 | Resp 20

## 2016-08-02 VITALS — BP 107/71 | HR 150 | Resp 21 | Ht 73.0 in | Wt 161.9 lb

## 2016-08-02 DIAGNOSIS — C7801 Secondary malignant neoplasm of right lung: Secondary | ICD-10-CM

## 2016-08-02 DIAGNOSIS — Z5112 Encounter for antineoplastic immunotherapy: Secondary | ICD-10-CM | POA: Diagnosis not present

## 2016-08-02 DIAGNOSIS — C7802 Secondary malignant neoplasm of left lung: Secondary | ICD-10-CM

## 2016-08-02 DIAGNOSIS — C139 Malignant neoplasm of hypopharynx, unspecified: Secondary | ICD-10-CM | POA: Diagnosis present

## 2016-08-02 DIAGNOSIS — J9611 Chronic respiratory failure with hypoxia: Secondary | ICD-10-CM

## 2016-08-02 DIAGNOSIS — E440 Moderate protein-calorie malnutrition: Secondary | ICD-10-CM

## 2016-08-02 DIAGNOSIS — G893 Neoplasm related pain (acute) (chronic): Secondary | ICD-10-CM

## 2016-08-02 DIAGNOSIS — E860 Dehydration: Secondary | ICD-10-CM

## 2016-08-02 DIAGNOSIS — R5381 Other malaise: Secondary | ICD-10-CM

## 2016-08-02 DIAGNOSIS — R410 Disorientation, unspecified: Secondary | ICD-10-CM

## 2016-08-02 DIAGNOSIS — D509 Iron deficiency anemia, unspecified: Secondary | ICD-10-CM | POA: Diagnosis not present

## 2016-08-02 DIAGNOSIS — I2782 Chronic pulmonary embolism: Secondary | ICD-10-CM | POA: Diagnosis not present

## 2016-08-02 DIAGNOSIS — C7800 Secondary malignant neoplasm of unspecified lung: Secondary | ICD-10-CM

## 2016-08-02 DIAGNOSIS — Z79899 Other long term (current) drug therapy: Secondary | ICD-10-CM | POA: Diagnosis not present

## 2016-08-02 DIAGNOSIS — Z5111 Encounter for antineoplastic chemotherapy: Secondary | ICD-10-CM

## 2016-08-02 LAB — CBC WITH DIFFERENTIAL/PLATELET
BASO%: 0.2 % (ref 0.0–2.0)
Basophils Absolute: 0 10*3/uL (ref 0.0–0.1)
EOS ABS: 0.1 10*3/uL (ref 0.0–0.5)
EOS%: 1.4 % (ref 0.0–7.0)
HCT: 34.8 % — ABNORMAL LOW (ref 38.4–49.9)
HEMOGLOBIN: 10.9 g/dL — AB (ref 13.0–17.1)
LYMPH%: 10.9 % — AB (ref 14.0–49.0)
MCH: 25.6 pg — ABNORMAL LOW (ref 27.2–33.4)
MCHC: 31.3 g/dL — ABNORMAL LOW (ref 32.0–36.0)
MCV: 81.7 fL (ref 79.3–98.0)
MONO#: 0.5 10*3/uL (ref 0.1–0.9)
MONO%: 7.2 % (ref 0.0–14.0)
NEUT%: 80.3 % — ABNORMAL HIGH (ref 39.0–75.0)
NEUTROS ABS: 5 10*3/uL (ref 1.5–6.5)
PLATELETS: 235 10*3/uL (ref 140–400)
RBC: 4.26 10*6/uL (ref 4.20–5.82)
RDW: 19.9 % — ABNORMAL HIGH (ref 11.0–14.6)
WBC: 6.3 10*3/uL (ref 4.0–10.3)
lymph#: 0.7 10*3/uL — ABNORMAL LOW (ref 0.9–3.3)

## 2016-08-02 LAB — COMPREHENSIVE METABOLIC PANEL
ALK PHOS: 104 U/L (ref 40–150)
ALT: 25 U/L (ref 0–55)
ANION GAP: 10 meq/L (ref 3–11)
AST: 45 U/L — ABNORMAL HIGH (ref 5–34)
Albumin: 2.8 g/dL — ABNORMAL LOW (ref 3.5–5.0)
BILIRUBIN TOTAL: 1.24 mg/dL — AB (ref 0.20–1.20)
BUN: 25.9 mg/dL (ref 7.0–26.0)
CALCIUM: 9.3 mg/dL (ref 8.4–10.4)
CO2: 27 mEq/L (ref 22–29)
Chloride: 100 mEq/L (ref 98–109)
Creatinine: 0.9 mg/dL (ref 0.7–1.3)
Glucose: 97 mg/dl (ref 70–140)
Potassium: 3.4 mEq/L — ABNORMAL LOW (ref 3.5–5.1)
Sodium: 137 mEq/L (ref 136–145)
TOTAL PROTEIN: 7.6 g/dL (ref 6.4–8.3)

## 2016-08-02 LAB — TSH: TSH: 1.448 m[IU]/L (ref 0.320–4.118)

## 2016-08-02 MED ORDER — SODIUM CHLORIDE 0.9 % IV SOLN
200.0000 mg | Freq: Once | INTRAVENOUS | Status: AC
  Administered 2016-08-02: 200 mg via INTRAVENOUS
  Filled 2016-08-02: qty 8

## 2016-08-02 MED ORDER — OXYCODONE HCL 15 MG PO TABS: 15.0000 mg | ORAL_TABLET | ORAL | 0 refills | Status: DC | PRN

## 2016-08-02 MED ORDER — SODIUM CHLORIDE 0.9% FLUSH
10.0000 mL | Freq: Once | INTRAVENOUS | Status: AC
  Administered 2016-08-02: 10 mL
  Filled 2016-08-02: qty 10

## 2016-08-02 MED ORDER — SODIUM CHLORIDE 0.9 % IV SOLN
Freq: Once | INTRAVENOUS | Status: AC
  Administered 2016-08-02: 15:00:00 via INTRAVENOUS

## 2016-08-02 MED ORDER — HEPARIN SOD (PORK) LOCK FLUSH 100 UNIT/ML IV SOLN
500.0000 [IU] | Freq: Once | INTRAVENOUS | Status: AC | PRN
  Administered 2016-08-02: 500 [IU]
  Filled 2016-08-02: qty 5

## 2016-08-02 MED ORDER — SODIUM CHLORIDE 0.9% FLUSH
10.0000 mL | INTRAVENOUS | Status: DC | PRN
  Administered 2016-08-02: 10 mL
  Filled 2016-08-02: qty 10

## 2016-08-02 MED ORDER — SODIUM CHLORIDE 0.9 % IV SOLN
Freq: Once | INTRAVENOUS | Status: DC
  Administered 2016-08-02: 14:00:00 via INTRAVENOUS

## 2016-08-02 NOTE — Patient Instructions (Signed)
[facility] Instructions for Patients Receiving Chemotherapy  Today you received the following chemotherapy agents Keytruda   To help prevent nausea and vomiting after your treatment, we encourage you to take your nausea medication as directed.    If you develop nausea and vomiting that is not controlled by your nausea medication, call the clinic.   BELOW ARE SYMPTOMS THAT SHOULD BE REPORTED IMMEDIATELY:  *FEVER GREATER THAN 100.5 F  *CHILLS WITH OR WITHOUT FEVER  NAUSEA AND VOMITING THAT IS NOT CONTROLLED WITH YOUR NAUSEA MEDICATION  *UNUSUAL SHORTNESS OF BREATH  *UNUSUAL BRUISING OR BLEEDING  TENDERNESS IN MOUTH AND THROAT WITH OR WITHOUT PRESENCE OF ULCERS  *URINARY PROBLEMS  *BOWEL PROBLEMS  UNUSUAL RASH Items with * indicate a potential emergency and should be followed up as soon as possible.  Feel free to call the clinic you have any questions or concerns. The clinic phone number is [phone].  Please show the [place] at check-in to the Emergency Department and triage nurse.   Dehydration, Adult Dehydration is a condition in which there is not enough fluid or water in the body. This happens when you lose more fluids than you take in. Important organs, such as the kidneys, brain, and heart, cannot function without a proper amount of fluids. Any loss of fluids from the body can lead to dehydration. Dehydration can range from mild to severe. This condition should be treated right away to prevent it from becoming severe. What are the causes? This condition may be caused by:  Vomiting.  Diarrhea.  Excessive sweating, such as from heat exposure or exercise.  Not drinking enough fluid, especially:  When ill.  While doing activity that requires a lot of energy.  Excessive urination.  Fever.  Infection.  Certain medicines, such as medicines that cause the body to lose excess fluid (diuretics).  Inability to access safe  drinking water.  Reduced physical ability to get adequate water and food. What increases the risk? This condition is more likely to develop in people:  Who have a poorly controlled long-term (chronic) illness, such as diabetes, heart disease, or kidney disease.  Who are age 37 or older.  Who are disabled.  Who live in a place with high altitude.  Who play endurance sports. What are the signs or symptoms? Symptoms of mild dehydration may include:  Thirst.  Dry lips.  Slightly dry mouth.  Dry, warm skin.  Dizziness. Symptoms of moderate dehydration may include:  Very dry mouth.  Muscle cramps.  Dark urine. Urine may be the color of tea.  Decreased urine production.  Decreased tear production.  Heartbeat that is irregular or faster than normal (palpitations).  Headache.  Light-headedness, especially when you stand up from a sitting position.  Fainting (syncope). Symptoms of severe dehydration may include:  Changes in skin, such as:  Cold and clammy skin.  Blotchy (mottled) or pale skin.  Skin that does not quickly return to normal after being lightly pinched and released (poor skin turgor).  Changes in body fluids, such as:  Extreme thirst.  No tear production.  Inability to sweat when body temperature is high, such as in hot weather.  Very little urine production.  Changes in vital signs, such as:  Weak pulse.  Pulse that is more than 100 beats a minute when sitting still.  Rapid breathing.  Low blood pressure.  Other changes, such as:  Sunken eyes.  Cold hands and feet.  Confusion.  Lack of  energy (lethargy).  Difficulty waking up from sleep.  Short-term weight loss.  Unconsciousness. How is this diagnosed? This condition is diagnosed based on your symptoms and a physical exam. Blood and urine tests may be done to help confirm the diagnosis. How is this treated? Treatment for this condition depends on the severity. Mild or  moderate dehydration can often be treated at home. Treatment should be started right away. Do not wait until dehydration becomes severe. Severe dehydration is an emergency and it needs to be treated in a hospital. Treatment for mild dehydration may include:  Drinking more fluids.  Replacing salts and minerals in your blood (electrolytes) that you may have lost. Treatment for moderate dehydration may include:  Drinking an oral rehydration solution (ORS). This is a drink that helps you replace fluids and electrolytes (rehydrate). It can be found at pharmacies and retail stores. Treatment for severe dehydration may include:  Receiving fluids through an IV tube.  Receiving an electrolyte solution through a feeding tube that is passed through your nose and into your stomach (nasogastric tube, or NG tube).  Correcting any abnormalities in electrolytes.  Treating the underlying cause of dehydration. Follow these instructions at home:  If directed by your health care provider, drink an ORS:  Make an ORS by following instructions on the package.  Start by drinking small amounts, about  cup (120 mL) every 5-10 minutes.  Slowly increase how much you drink until you have taken the amount recommended by your health care provider.  Drink enough clear fluid to keep your urine clear or pale yellow. If you were told to drink an ORS, finish the ORS first, then start slowly drinking other clear fluids. Drink fluids such as:  Water. Do not drink only water. Doing that can lead to having too little salt (sodium) in the body (hyponatremia).  Ice chips.  Fruit juice that you have added water to (diluted fruit juice).  Low-calorie sports drinks.  Avoid:  Alcohol.  Drinks that contain a lot of sugar. These include high-calorie sports drinks, fruit juice that is not diluted, and soda.  Caffeine.  Foods that are greasy or contain a lot of fat or sugar.  Take over-the-counter and prescription  medicines only as told by your health care provider.  Do not take sodium tablets. This can lead to having too much sodium in the body (hypernatremia).  Eat foods that contain a healthy balance of electrolytes, such as bananas, oranges, potatoes, tomatoes, and spinach.  Keep all follow-up visits as told by your health care provider. This is important. Contact a health care provider if:  You have abdominal pain that:  Gets worse.  Stays in one area (localizes).  You have a rash.  You have a stiff neck.  You are more irritable than usual.  You are sleepier or more difficult to wake up than usual.  You feel weak or dizzy.  You feel very thirsty.  You have urinated only a small amount of very dark urine over 6-8 hours. Get help right away if:  You have symptoms of severe dehydration.  You cannot drink fluids without vomiting.  Your symptoms get worse with treatment.  You have a fever.  You have a severe headache.  You have vomiting or diarrhea that:  Gets worse.  Does not go away.  You have blood or green matter (bile) in your vomit.  You have blood in your stool. This may cause stool to look black and tarry.  You have  not urinated in 6-8 hours.  You faint.  Your heart rate while sitting still is over 100 beats a minute.  You have trouble breathing. This information is not intended to replace advice given to you by your health care provider. Make sure you discuss any questions you have with your health care provider. Document Released: 05/28/2005 Document Revised: 12/23/2015 Document Reviewed: 07/22/2015 Elsevier Interactive Patient Education  2017 [employer].

## 2016-08-02 NOTE — Progress Notes (Signed)
Ok to treat today with HR with extra fluids given today per Hollace Kinnier, per MD Alvy Bimler.   Ok to proceed with Keytruda with VS after fluids per MD Alvy Bimler.

## 2016-08-02 NOTE — Assessment & Plan Note (Signed)
He has mild COPD This is actually improving on prednisone Continue the same

## 2016-08-02 NOTE — Assessment & Plan Note (Signed)
He has poorly controlled pain due to recent falls. I increase his oxycodone to 15 mg to take as needed for pain He is not taking his medicine consistently due to intermittent confusion at home

## 2016-08-02 NOTE — Assessment & Plan Note (Signed)
He has profound weight loss since he was last seen here. He is not taking his medication consistently. Previously, he has gained a lot of weight on prednisone but because he forgot to take his medications recently, his appetite has come down. I discussed importance of frequent small meals and taking his medications correctly. He has seen dietitian recently who also recommended nutritional supplements. I plan to order imaging study to exclude progression of cancer as a cause of his weight loss

## 2016-08-02 NOTE — Progress Notes (Signed)
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 No lymphadenopathy in the neck. A CT of the chest on 08/23/13 showed bullous emphysema and 2 nodules possibly representing metastatic disease including a 1.4cm left upper lobe nodule and a 1.4cm nodule in the right middle lobe.              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 Bilateral hypermetabolic pulmonary nodules are concerning for bilateral lung metastases.      09/14/2013 - 11/09/2013 Chemotherapy    The patient had weekly cisplatin x 3 with radiation at [facility]. Chemo was stopped due to intolerable toxicities       09/14/2013 - 10/12/2013 Radiation Therapy    He received radiation therapy at [facility]. IMRT was used to treat the primary tumor and regional lymphatics to 4600cGy in 200cGy daily fractions over 23 days followed by a boost of 2400cGy in 12 days to a total dose of 7000cGy.        09/22/2013 Procedure    He has placement of port       09/29/2013 Procedure    He has placement of feeding tube      10/12/2013 Procedure    He has removal of port      10/17/2013 Imaging    Ct neck showed thrombus is present in the jugular vein from the skull base to the brachiocephalic vein. A small venous branch or collateral also demonstrates thrombus in the anterior right neck. The SVC is patent. The right-sided  laryngeal mass is smaller with decreased but persistent right to left mass effect on the airway. Severe atherosclerotic changes at the bifurcation of the carotid arteries on the left, greater than right. In the dermis of the upper anterior right chest wall there is a small fluid and air collection measuring 2.3 x 1.1 cm.      10/17/2013 Imaging    Ct chest showed The SVC is patent however there is thrombus extending throughout the jugular vein to just below the brachiocephalic vein. There is also likely thrombus and a small peripheral vein in the anterior right neck. Several pulmonary nodules, one larger nodule appears smaller with another larger nodule stable and several new small pulmonary nodules. Follow-up recommended. New mild right lower lobe airspace disease with several nodular opacities. This may represent infection. No pleural effusion.      01/01/2014 Imaging    Ct chest showed Interval improvement in heterogeneous right lower lobe opacity, likely improving infection/aspiration. Previously visualized FDG avid left upper lobe nodule is less solid compared to most recent prior but not significantly changed in size. Previously visualized FDG avid right middle lobe nodule has increased in size compared to most recent prior, likely disease progression. Significant interval increase in size of left upper lobe nodules consistent with progressive metastatic disease. Other smaller previously visualized nodules are not visualized and were likely  inflammatory/infectious in etiology.      01/01/2014 Imaging    Ct neck showed thrombus present in the jugular vein from skull base to brachiocephalic vein, the SVC remains patent. Extensive atherosclerotic calcifications of the carotid bifurcations left greater than right the mass in the hypopharynx. Improved appearance of the hypopharyngeal mass with less edema and last less shift to the left. Less edema and fluid within the adjacent soft tissues also  demonstrated. Increased fluid in the retropharyngeal prevertebral soft tissues.      04/15/2014 Imaging    CT pulmonary arteriogram demonstrates no evidence of pulmonary embolism. Bilateral pulmonary lesions demonstrated enlarged since the prior study. A new lesion is also seen within the lingula. Findings would be typical of pulmonary metastatic lesions. Focal parenchymal infiltrate demonstrated in the right lower lobe. Left basilar atelectasis. Middle lobe infiltrate or atelectasis demonstrated with underlying bronchiectasis. Lingular infiltrate or atelectasis also      05/03/2014 Imaging    Ct chest showed interval enlargement of at least 2 pulmonary nodules as described above. Findings are suspicious for worsening disease.  Interval development of consolidation within the lateral segment right middle lobe could be related to a filling defect in a branch of the lateral segment right middle lobe bronchus. The consolidation favored to represent atelectasis. The endobronchial focus could represent mucoid impactionor possibly a fixed endobronchial lesion. Aspiration would be less likely given its location in the right middle lobe.      05/03/2014 Imaging    Ct neck showed no significant change in right hypopharyngeal mass. Stable airway asymmetry. Ill-defined soft tissue along the carotid space carotid encasement mildly improved. Multispatial soft tissue stranding/fluid has improved. Right internal jugular vein not visualized..      05/13/2014 Procedure    Tracheostomy was removed         06/16/2014 - 03/16/2015 Chemotherapy    The patient had carboplatin AUC 2 and Taxol 45 mg/m2 at [facility]       06/16/2014 Imaging    Ct chest showed stable dominant nodules from recent prior. Presumably, metastatic disease. New increased groundglass attenuation opacities, right lower lobe, centrilobular and clumped nodules, as well as a more dominant nodular opacity. While these findings could be secondary to  progressive metastatic disease, given their location and appearance, aspiration pneumonia/pneumonitis is also possible.      06/21/2014 Imaging    Ct abdomen showed gastrostomy tube is positioned within the stomach. No evidence for an acute inflammatory process within the abdomen or pelvis. No large or small bowel obstruction. No change right lower lobe airspace disease and nodular opacities      09/30/2014 Imaging    Ct chest showed interval decrease in size of metastatic pulmonary nodules. One of these nodules demonstrates new cavitation within the right upper lobe. Interval improvement of nodular opacities within the right lower lobe suggestive of a resolving aspiration.      04/01/2015 Imaging    Ct chest showed small amount of right upper lobe acute, nonocclusive pulmonary embolus. Enlarged left upper lobe pulmonary metastasis. Mixed response with resolved previous smaller nodule in the lingula. Right middle and right lower lobe appearance likely represents posttreatment cicatrization atelectasis (query if radiation therapy has been performed in this region). Worsened right infrahilar bronchial wall thickening with occlusion of a subsegmental branch. No evidence for postobstructive pneumonia. Paraseptal emphysema.      04/19/2015 - 02/29/2016 Chemotherapy    The patient had Keytruda at [facility].  He subsequently relocated to [facility]  04/29/2015 Imaging    Ct neck showed interval significant reduction in size of previous large right supraglottic mass. Airway is now widely patent at this level. No definite discrete supraglottic or pharyngeal mass demonstrated currently by CT. Left upper lobe spiculated mass. Prominent calcified atherosclerotic plaquing involving the carotid bifurcations and carotid bulbs greater on the left resulting in stenosis of the internal carotid artery origins. Consider follow-up carotid Doppler ultrasound exam for further evaluation.      07/13/2015 Imaging    Ct  neck showed expected post-treatment changes in the neck without evidence of recurrent disease in the primary site. No abnormal lymph nodes. Significant biapical bullous disease /paraseptal emphysema in the lungs, unchanged from prior. Previously seen left upper lobe nodular mass is visualized on the same-day CT chest.       07/13/2015 Imaging    Ct chest showed interval decrease in size of a spiculated left upper lobe nodule. Numerous additional nonspecific pulmonary nodules of the bilateral lungs, which are stable compared to prior examination. Clustered tree-in-bud nodules of the right lung base are likely sequelae of Aspiration. Bronchiectasis and atelectasis of the right middle lobe. Emphysema.      08/24/2015 Miscellaneous    He received IV feraheme      09/20/2015 Imaging    CT scan showed acute, comminuted, medial right clavicular fracture and marked, overlying soft tissue swelling. Mild, acute, T10 compression fracture.  The known left upper lobe carcinoma has modestly decreased in size since the 04/01/2015 comparison. Perhaps there has been a response to interim therapy. Right middle lobe and lower lobe bronchiectasis and pleuroparenchymal scarring were also present on the 04/01/2015 comparison. Confluent consolidation, however, is less striking than on the prior. Perhaps this is residua of prior pneumonia or radiation. Marked bullous emphysema is unchanged. Advanced, 3-vessel coronary artery disease. The liver is unusual in shape, but the findings are not definitive for cirrhosis. The appearance is unchanged since 08/19/2014.      09/20/2015 Imaging    Ct head showed mild patchy low-density present in the periventricular white matter. No mass, hemorrhage, or extracerebral fluid.  Mild supratentorial ventricle and sulcal prominence within normal limits.  A component of diffuse cerebellar atrophy is present. Carotid siphon and distal left vertebral vascular calcifications are present. On bone  windows, no calvarial lesion. Normal mastoid aeration. Mucosal thickening left posterior ethmoid air cells and retention cyst left maxillary sinus.      10/05/2015 Imaging    CT chest, abdomen and pelvis showed increasing size of spiculated nodule in the left upper lobe. Increasing adjacent groundglass opacities are concerning for lymphangitic spread of tumor. Additional pulmonary nodules are unchanged. Multiple mildly enlarged mesenteric lymph nodes, similar to prior. Sclerotic changes and height loss of the T10 vertebral body, new from prior. This is concerning for osseous metastatic disease. Extensive atherosclerotic disease at the origin of the celiac and throughout the proximal superior mesenteric artery.      10/05/2015 Imaging    Ct neck showed decreased supraglottic edema, with residual asymmetric fullness right greater than left. Stable post-therapeutic retropharyngeal edema. Comminuted fracture of the proximal right clavicle/clavicular head with surrounding soft tissue and pectoral stranding, new in the interval. Findings could be posttraumatic and/or pathologic.      11/16/2015 Imaging    Ct chest showed stable dominant left upper lobe nodule, and surrounding opacities which may be related to radiation. New 10-14-mm left upper lobe nodule, anterior to dominant nodule and radiation change. While this could also represent radiation  change, infection or neoplasm are in the differential diagnosis      12/27/2015 Imaging    Ct chest showed stable to slight decrease in nodular consolidative opacities, which are likely related to prior therapy. No new abnormalities      01/31/2016 Imaging    CT brain at [facility] showed no metastatic disease. No acute intracranial process.      03/22/2016 -  Chemotherapy    He receives Keytruda       04/11/2016 Imaging    Ct chest showed 15 x 10 mm left upper lobe nodule, mildly decreased and likely reflecting improving metastasis, with surrounding radiation  changes. Additional multifocal patchy opacities in the left lower lobe, favored to reflect radiation changes, less likely infection.      04/11/2016 Imaging    Ct neck showed regressed diffuse pharyngeal mucosal space soft tissue thickening since April 2017, likely sequelae of radiation. No residual pharyngeal mass or lymphadenopathy identified. Pathologic appearing fracture of the medial right clavicle redemonstrated, with some interval periosteal new bone formation but no solid osseous union. No osseous metastatic disease identified in the neck.       INTERVAL HISTORY: Please see below for problem oriented charting. The patient is here today, seen before treatment. He has intermittent confusion. His friend felt that he is not taking his medications as directed. The patient is very tearful. He is not hungry and has lost some weight. There were also reported recent history of fall, causing hip pain.  He is not taking his pain medicine as directed.  He denies nausea or constipation. Denies neurological deficits. He is not eating frequently because he has no appetite.  He does not remember when he last took his steroid medication that was prescribed for energy and appetite.  He has mild chronic cough.  He is not using his oxygen as directed. The patient denies any recent signs or symptoms of bleeding such as spontaneous epistaxis, hematuria or hematochezia.  REVIEW OF SYSTEMS:   Constitutional: Denies fevers, chills or abnormal weight loss Eyes: Denies blurriness of vision Ears, nose, mouth, throat, and face: Denies mucositis or sore throat Cardiovascular: Denies palpitation, chest discomfort or lower extremity swelling Gastrointestinal:  Denies nausea, heartburn or change in bowel habits Skin: Denies abnormal skin rashes Lymphatics: Denies new lymphadenopathy or easy bruising Behavioral/Psych: Mood is stable, no new changes  All other systems were reviewed with the patient and are  negative.  I have reviewed the past medical history, past surgical history, social history and family history with the patient and they are unchanged from previous note.  ALLERGIES:  is allergic to lisinopril.  MEDICATIONS:  Current Outpatient Prescriptions  Medication Sig Dispense Refill  . albuterol (ACCUNEB) 1.25 MG/3ML nebulizer solution Take 3 mLs (1.25 mg total) by nebulization every 6 (six) hours as needed for wheezing or shortness of breath. 360 mL 0  . albuterol (PROVENTIL HFA;VENTOLIN HFA) 108 (90 Base) MCG/ACT inhaler Inhale 2 puffs into the lungs every 4 (four) hours as needed for wheezing or shortness of breath. 18 g 0  . budesonide-formoterol (SYMBICORT) 160-4.5 MCG/ACT inhaler Inhale 2 puffs into the lungs 2 (two) times daily. 1 Inhaler 6  . diltiazem (CARDIZEM CD) 180 MG 24 hr capsule Take 1 capsule (180 mg total) by mouth daily. 30 capsule 0  . gabapentin (NEURONTIN) 100 MG capsule Take 1 capsule (100 mg total) by mouth at bedtime. 30 capsule 0  . guaiFENesin-codeine (ROBITUSSIN AC) 100-10 MG/5ML syrup Take by mouth.    Marland Kitchen  isosorbide dinitrate (ISORDIL) 20 MG tablet Take 1 tablet (20 mg total) by mouth 2 (two) times daily. 60 tablet 0  . lidocaine-prilocaine (EMLA) cream Apply 1 application topically as needed. Apply to [facility] a Cath site at least one hour prior to needle stick 30 g 3  . magnesium oxide (MAG-OX) 400 MG tablet Take 1 tablet (400 mg total) by mouth daily. 30 tablet 0  . mirtazapine (REMERON) 15 MG tablet Take 1 tablet (15 mg total) by mouth at bedtime. 30 tablet 1  . nitroGLYCERIN (NITROSTAT) 0.3 MG SL tablet Place 1 tablet (0.3 mg total) under the tongue every 5 (five) minutes as needed for chest pain. 90 tablet 12  . OLANZapine (ZYPREXA) 5 MG tablet Take 1 tablet (5 mg total) by mouth at bedtime. 30 tablet 0  . ondansetron (ZOFRAN-ODT) 8 MG disintegrating tablet Take 1 tablet (8 mg total) by mouth every 8 (eight) hours as needed for nausea or vomiting. 30 tablet 0   . oxyCODONE (ROXICODONE) 15 MG immediate release tablet Take 1 tablet (15 mg total) by mouth every 4 (four) hours as needed for severe pain. 90 tablet 0  . Oxycodone HCl 10 MG TABS Take 1 tablet (10 mg total) by mouth every 4 (four) hours as needed. 90 tablet 0  . pantoprazole (PROTONIX) 40 MG tablet Take 1 tablet (40 mg total) by mouth daily. 30 tablet 0  . predniSONE (DELTASONE) 10 MG tablet Take 1 tablet (10 mg total) by mouth daily with breakfast. 30 tablet 11  . prochlorperazine (COMPAZINE) 10 MG tablet Take 1 tablet (10 mg total) by mouth every 6 (six) hours as needed for nausea or vomiting. 90 tablet 6  . rivaroxaban (XARELTO) 20 MG TABS tablet Take 1 tablet (20 mg total) by mouth daily with supper. 30 tablet 0   No current facility-administered medications for this visit.    Facility-Administered Medications Ordered in Other Visits  Medication Dose Route Frequency Provider Last Rate Last Dose  . 0.9 %  sodium chloride infusion   Intravenous Once Heath Lark, MD      . heparin lock flush 100 unit/mL  500 Units Intracatheter Once PRN Heath Lark, MD      . pembrolizumab (KEYTRUDA) 200 mg in sodium chloride 0.9 % 50 mL chemo infusion  200 mg Intravenous Once Heath Lark, MD      . sodium chloride flush (NS) 0.9 % injection 10 mL  10 mL Intracatheter PRN Heath Lark, MD        PHYSICAL EXAMINATION: ECOG PERFORMANCE STATUS: 1 - Symptomatic but completely ambulatory  Vitals:   08/02/16 1243  BP: 107/71  Pulse: (!) 150  Resp: (!) 21   Filed Weights   08/02/16 1243  Weight: 161 lb 14.4 oz (73.4 kg)    GENERAL:alert, no distress and comfortable.  He looks thin and cachectic SKIN: skin color, texture, turgor are normal, no rashes or significant lesions EYES: normal, Conjunctiva are pink and non-injected, sclera clear OROPHARYNX: Dry mucous membrane is noted NECK: supple, thyroid normal size, non-tender, without nodularity LYMPH:  no palpable lymphadenopathy in the cervical, axillary  or inguinal LUNGS: clear to auscultation and percussion with normal breathing effort HEART: regular rate & rhythm and no murmurs and no lower extremity edema ABDOMEN:abdomen soft, non-tender and normal bowel sounds Musculoskeletal:no cyanosis of digits and no clubbing  NEURO: alert & oriented x 3 with fluent speech, no focal motor/sensory deficits  LABORATORY DATA:  I have reviewed the data as listed  Component Value Date/Time   NA 137 08/02/2016 1216   K 3.4 (L) 08/02/2016 1216   CL 99 (L) 06/13/2016 0935   CO2 27 08/02/2016 1216   GLUCOSE 97 08/02/2016 1216   BUN 25.9 08/02/2016 1216   CREATININE 0.9 08/02/2016 1216   CALCIUM 9.3 08/02/2016 1216   PROT 7.6 08/02/2016 1216   ALBUMIN 2.8 (L) 08/02/2016 1216   AST 45 (H) 08/02/2016 1216   ALT 25 08/02/2016 1216   ALKPHOS 104 08/02/2016 1216   BILITOT 1.24 (H) 08/02/2016 1216   GFRNONAA >60 06/13/2016 0935   GFRAA >60 06/13/2016 0935    No results found for: SPEP, UPEP  Lab Results  Component Value Date   WBC 6.3 08/02/2016   NEUTROABS 5.0 08/02/2016   HGB 10.9 (L) 08/02/2016   HCT 34.8 (L) 08/02/2016   MCV 81.7 08/02/2016   PLT 235 08/02/2016      Chemistry      Component Value Date/Time   NA 137 08/02/2016 1216   K 3.4 (L) 08/02/2016 1216   CL 99 (L) 06/13/2016 0935   CO2 27 08/02/2016 1216   BUN 25.9 08/02/2016 1216   CREATININE 0.9 08/02/2016 1216      Component Value Date/Time   CALCIUM 9.3 08/02/2016 1216   ALKPHOS 104 08/02/2016 1216   AST 45 (H) 08/02/2016 1216   ALT 25 08/02/2016 1216   BILITOT 1.24 (H) 08/02/2016 1216     ASSESSMENT & PLAN:  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 His most recent CT scan in October 2017 showed a positive response to treatment We will continue treatment every 3 weeks as scheduled and a plan to repeat another imaging  study due March 2018   Cancer associated pain He has poorly controlled pain due to recent falls. I increase his oxycodone to 15 mg to take as needed for pain He is not taking his medicine consistently due to intermittent confusion at home  Metastasis to lung [facility]) He has mild COPD This is actually improving on prednisone Continue the same  Protein-calorie malnutrition, moderate ([place]) He has profound weight loss since he was last seen here. He is not taking his medication consistently. Previously, he has gained a lot of weight on prednisone but because he forgot to take his medications recently, his appetite has come down. I discussed importance of frequent small meals and taking his medications correctly. He has seen dietitian recently who also recommended nutritional supplements. I plan to order imaging study to exclude progression of cancer as a cause of his weight loss  Pulmonary embolism ([place]) He had history of DVT and PE. He is on chronic anticoagulation therapy. Despite mild iron deficiency anemia, I felt that the benefit of chronic anticoagulation therapy outweigh the risks.  Physical deconditioning He has significant physical deconditioning. He is tearful and wants to participate daily physical therapy. His vital signs are as unstable. I doubt he would be able to tolerate aggressive outpatient therapy. I recommend we start with home physical therapy first and gradually increase his activity as tolerated.  Iron deficiency anemia The patient is noted to have iron deficiency anemia, could be due to minor GI bleed from chronic anticoagulation therapy. He has recently received intravenous iron.  Dehydration He appears clinically dehydrated. He is tachycardic. I would add additional IV fluids today during treatment.   Orders Placed This Encounter  Procedures  .  CT CHEST W CONTRAST    Standing Status:   Future    Standing Expiration Date:   09/06/2017    Order Specific  Question:   Reason for exam:    Answer:   laryngeal cancer with lung mets, assess response to Rx    Order Specific Question:   Preferred imaging location?    Answer:   [facility]  . CT Head W Contrast    Standing Status:   Future    Standing Expiration Date:   09/06/2017    Order Specific Question:   If indicated for the ordered procedure, I authorize the administration of contrast media per Radiology protocol    Answer:   Yes    Order Specific Question:   Reason for Exam (SYMPTOM  OR DIAGNOSIS REQUIRED)    Answer:   laryngeal cancer with lung mets, assess response to Rx, recent confusion    Order Specific Question:   Preferred imaging location?    Answer:   [facility]  . CT Soft Tissue Neck W Contrast    Standing Status:   Future    Standing Expiration Date:   09/06/2017    Order Specific Question:   If indicated for the ordered procedure, I authorize the administration of contrast media per Radiology protocol    Answer:   Yes    Order Specific Question:   Reason for Exam (SYMPTOM  OR DIAGNOSIS REQUIRED)    Answer:   laryngeal cancer with lung mets, assess response to Rx    Order Specific Question:   Preferred imaging location?    Answer:   [facility]   All questions were answered. The patient knows to call the clinic with any problems, questions or concerns. No barriers to learning was detected. I spent 30 minutes counseling the patient face to face. The total time spent in the appointment was 55 minutes and more than 50% was on counseling and review of test results     Heath Lark, MD 08/02/2016 2:00 PM

## 2016-08-02 NOTE — Assessment & Plan Note (Signed)
He had history of DVT and PE. He is on chronic anticoagulation therapy. Despite mild iron deficiency anemia, I felt that the benefit of chronic anticoagulation therapy outweigh the risks.

## 2016-08-02 NOTE — Telephone Encounter (Signed)
Per 2/22 LOS and staff message. Notified the scheduler

## 2016-08-02 NOTE — Assessment & Plan Note (Signed)
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 His most recent CT scan in October 2017 showed a positive response to treatment We will continue treatment every 3 weeks as scheduled and a plan to repeat another imaging study due March 2018

## 2016-08-02 NOTE — Assessment & Plan Note (Signed)
The patient is noted to have iron deficiency anemia, could be due to minor GI bleed from chronic anticoagulation therapy. He has recently received intravenous iron.

## 2016-08-02 NOTE — Assessment & Plan Note (Signed)
He has significant physical deconditioning. He is tearful and wants to participate daily physical therapy. His vital signs are as unstable. I doubt he would be able to tolerate aggressive outpatient therapy. I recommend we start with home physical therapy first and gradually increase his activity as tolerated.

## 2016-08-02 NOTE — Assessment & Plan Note (Signed)
He appears clinically dehydrated. He is tachycardic. I would add additional IV fluids today during treatment.

## 2016-08-02 NOTE — Telephone Encounter (Signed)
Message sent to chemo scheduler to be added, per 08/02/16 los. Lab and flush scheduled for 08/23/16, per 08/02/16 los. Patient was given a copy of the AVS report and appointment schedule per 08/02/16 los.

## 2016-08-06 ENCOUNTER — Inpatient Hospital Stay (HOSPITAL_COMMUNITY)
Admission: EM | Admit: 2016-08-06 | Discharge: 2016-08-09 | DRG: 190 | Disposition: A | Discharge status: Hospice | Payer: Medicare Other | Attending: Internal Medicine | Admitting: Internal Medicine

## 2016-08-06 ENCOUNTER — Emergency Department (HOSPITAL_COMMUNITY): Payer: Medicare Other

## 2016-08-06 ENCOUNTER — Other Ambulatory Visit: Payer: Self-pay

## 2016-08-06 ENCOUNTER — Encounter (HOSPITAL_COMMUNITY): Payer: Self-pay

## 2016-08-06 DIAGNOSIS — J9600 Acute respiratory failure, unspecified whether with hypoxia or hypercapnia: Secondary | ICD-10-CM

## 2016-08-06 DIAGNOSIS — Z87891 Personal history of nicotine dependence: Secondary | ICD-10-CM

## 2016-08-06 DIAGNOSIS — J9621 Acute and chronic respiratory failure with hypoxia: Secondary | ICD-10-CM | POA: Diagnosis present

## 2016-08-06 DIAGNOSIS — E440 Moderate protein-calorie malnutrition: Secondary | ICD-10-CM | POA: Diagnosis present

## 2016-08-06 DIAGNOSIS — J9611 Chronic respiratory failure with hypoxia: Secondary | ICD-10-CM | POA: Diagnosis not present

## 2016-08-06 DIAGNOSIS — I480 Paroxysmal atrial fibrillation: Secondary | ICD-10-CM | POA: Diagnosis present

## 2016-08-06 DIAGNOSIS — C7801 Secondary malignant neoplasm of right lung: Secondary | ICD-10-CM | POA: Diagnosis present

## 2016-08-06 DIAGNOSIS — J449 Chronic obstructive pulmonary disease, unspecified: Secondary | ICD-10-CM | POA: Diagnosis not present

## 2016-08-06 DIAGNOSIS — J441 Chronic obstructive pulmonary disease with (acute) exacerbation: Secondary | ICD-10-CM | POA: Diagnosis not present

## 2016-08-06 DIAGNOSIS — D638 Anemia in other chronic diseases classified elsewhere: Secondary | ICD-10-CM | POA: Diagnosis present

## 2016-08-06 DIAGNOSIS — Z66 Do not resuscitate: Secondary | ICD-10-CM | POA: Diagnosis present

## 2016-08-06 DIAGNOSIS — Z923 Personal history of irradiation: Secondary | ICD-10-CM

## 2016-08-06 DIAGNOSIS — Z9981 Dependence on supplemental oxygen: Secondary | ICD-10-CM

## 2016-08-06 DIAGNOSIS — C139 Malignant neoplasm of hypopharynx, unspecified: Secondary | ICD-10-CM | POA: Diagnosis not present

## 2016-08-06 DIAGNOSIS — Z86711 Personal history of pulmonary embolism: Secondary | ICD-10-CM

## 2016-08-06 DIAGNOSIS — D509 Iron deficiency anemia, unspecified: Secondary | ICD-10-CM | POA: Diagnosis present

## 2016-08-06 DIAGNOSIS — Z8673 Personal history of transient ischemic attack (TIA), and cerebral infarction without residual deficits: Secondary | ICD-10-CM

## 2016-08-06 DIAGNOSIS — Z7189 Other specified counseling: Secondary | ICD-10-CM

## 2016-08-06 DIAGNOSIS — I252 Old myocardial infarction: Secondary | ICD-10-CM

## 2016-08-06 DIAGNOSIS — R0600 Dyspnea, unspecified: Secondary | ICD-10-CM

## 2016-08-06 DIAGNOSIS — Z6820 Body mass index (BMI) 20.0-20.9, adult: Secondary | ICD-10-CM

## 2016-08-06 DIAGNOSIS — R0602 Shortness of breath: Secondary | ICD-10-CM | POA: Diagnosis not present

## 2016-08-06 DIAGNOSIS — I2782 Chronic pulmonary embolism: Secondary | ICD-10-CM

## 2016-08-06 DIAGNOSIS — C7802 Secondary malignant neoplasm of left lung: Secondary | ICD-10-CM | POA: Diagnosis present

## 2016-08-06 DIAGNOSIS — J189 Pneumonia, unspecified organism: Secondary | ICD-10-CM | POA: Diagnosis not present

## 2016-08-06 DIAGNOSIS — Z7901 Long term (current) use of anticoagulants: Secondary | ICD-10-CM

## 2016-08-06 DIAGNOSIS — Z86718 Personal history of other venous thrombosis and embolism: Secondary | ICD-10-CM

## 2016-08-06 DIAGNOSIS — G893 Neoplasm related pain (acute) (chronic): Secondary | ICD-10-CM | POA: Diagnosis present

## 2016-08-06 DIAGNOSIS — J9610 Chronic respiratory failure, unspecified whether with hypoxia or hypercapnia: Secondary | ICD-10-CM | POA: Diagnosis present

## 2016-08-06 DIAGNOSIS — Z515 Encounter for palliative care: Secondary | ICD-10-CM | POA: Diagnosis present

## 2016-08-06 DIAGNOSIS — I2510 Atherosclerotic heart disease of native coronary artery without angina pectoris: Secondary | ICD-10-CM | POA: Diagnosis present

## 2016-08-06 DIAGNOSIS — J9620 Acute and chronic respiratory failure, unspecified whether with hypoxia or hypercapnia: Secondary | ICD-10-CM

## 2016-08-06 LAB — BASIC METABOLIC PANEL
ANION GAP: 7 (ref 5–15)
BUN: 9 mg/dL (ref 6–20)
CHLORIDE: 101 mmol/L (ref 101–111)
CO2: 29 mmol/L (ref 22–32)
CREATININE: 0.95 mg/dL (ref 0.61–1.24)
Calcium: 9.5 mg/dL (ref 8.9–10.3)
GFR calc non Af Amer: >60 mL/min (ref >60)
Glucose, Bld: 117 mg/dL — ABNORMAL HIGH (ref 65–99)
Potassium: 3.6 mmol/L (ref 3.5–5.1)
SODIUM: 137 mmol/L (ref 135–145)

## 2016-08-06 LAB — I-STAT TROPONIN, ED: Troponin i, poc: 0.03 ng/mL (ref 0.00–0.08)

## 2016-08-06 LAB — CBC
HCT: 32.1 % — ABNORMAL LOW (ref 39.0–52.0)
HEMOGLOBIN: 10 g/dL — AB (ref 13.0–17.0)
MCH: 25.5 pg — AB (ref 26.0–34.0)
MCHC: 31.2 g/dL (ref 30.0–36.0)
MCV: 81.9 fL (ref 78.0–100.0)
PLATELETS: 238 10*3/uL (ref 150–400)
RBC: 3.92 MIL/uL — AB (ref 4.22–5.81)
RDW: 19.7 % — ABNORMAL HIGH (ref 11.5–15.5)
WBC: 6.8 10*3/uL (ref 4.0–10.5)

## 2016-08-06 LAB — PROCALCITONIN: Procalcitonin: <0.1 ng/mL

## 2016-08-06 LAB — I-STAT CG4 LACTIC ACID, ED
Lactic Acid, Venous: 1.01 mmol/L (ref 0.5–1.9)
Lactic Acid, Venous: 0.75 mmol/L (ref 0.5–1.9)

## 2016-08-06 LAB — BRAIN NATRIURETIC PEPTIDE: B Natriuretic Peptide: 377.1 pg/mL — ABNORMAL HIGH (ref 0.0–100.0)

## 2016-08-06 MED ORDER — MIRTAZAPINE 15 MG PO TABS
15.0000 mg | ORAL_TABLET | Freq: Every day | ORAL | Status: DC
  Administered 2016-08-06 – 2016-08-08 (×3): 15 mg via ORAL
  Filled 2016-08-06 (×3): qty 1

## 2016-08-06 MED ORDER — OXYCODONE HCL 5 MG PO TABS
15.0000 mg | ORAL_TABLET | ORAL | Status: DC | PRN
  Administered 2016-08-07 – 2016-08-09 (×2): 15 mg via ORAL
  Filled 2016-08-06 (×2): qty 3

## 2016-08-06 MED ORDER — ONDANSETRON HCL 4 MG PO TABS: 4.0000 mg | ORAL_TABLET | Freq: Four times a day (QID) | ORAL | Status: DC | PRN

## 2016-08-06 MED ORDER — MAGNESIUM OXIDE 400 MG PO TABS
400.0000 mg | ORAL_TABLET | Freq: Every day | ORAL | Status: DC
  Administered 2016-08-07 – 2016-08-09 (×3): 400 mg via ORAL
  Filled 2016-08-06 (×6): qty 1

## 2016-08-06 MED ORDER — OLANZAPINE 5 MG PO TABS
5.0000 mg | ORAL_TABLET | Freq: Every day | ORAL | Status: DC
  Administered 2016-08-06 – 2016-08-08 (×3): 5 mg via ORAL
  Filled 2016-08-06 (×4): qty 1

## 2016-08-06 MED ORDER — MOMETASONE FURO-FORMOTEROL FUM 200-5 MCG/ACT IN AERO
2.0000 | INHALATION_SPRAY | Freq: Two times a day (BID) | RESPIRATORY_TRACT | Status: DC
  Administered 2016-08-06 – 2016-08-09 (×6): 2 via RESPIRATORY_TRACT
  Filled 2016-08-06: qty 8.8

## 2016-08-06 MED ORDER — PANTOPRAZOLE SODIUM 40 MG PO TBEC
40.0000 mg | DELAYED_RELEASE_TABLET | Freq: Every day | ORAL | Status: DC
  Administered 2016-08-07 – 2016-08-09 (×3): 40 mg via ORAL
  Filled 2016-08-06 (×3): qty 1

## 2016-08-06 MED ORDER — LATANOPROST 0.005 % OP SOLN
1.0000 [drp] | Freq: Every day | OPHTHALMIC | Status: DC
  Administered 2016-08-06 – 2016-08-08 (×3): 1 [drp] via OPHTHALMIC
  Filled 2016-08-06: qty 2.5

## 2016-08-06 MED ORDER — DEXTROSE 5 % IV SOLN
1.0000 g | Freq: Once | INTRAVENOUS | Status: AC
  Administered 2016-08-06: 1 g via INTRAVENOUS
  Filled 2016-08-06: qty 10

## 2016-08-06 MED ORDER — ALBUTEROL SULFATE (2.5 MG/3ML) 0.083% IN NEBU: 5.0000 mg | INHALATION_SOLUTION | Freq: Once | RESPIRATORY_TRACT | Status: DC

## 2016-08-06 MED ORDER — RIVAROXABAN 20 MG PO TABS
20.0000 mg | ORAL_TABLET | Freq: Every day | ORAL | Status: DC
  Administered 2016-08-06 – 2016-08-08 (×3): 20 mg via ORAL
  Filled 2016-08-06 (×3): qty 1

## 2016-08-06 MED ORDER — PROCHLORPERAZINE MALEATE 10 MG PO TABS: 10.0000 mg | ORAL_TABLET | Freq: Four times a day (QID) | ORAL | Status: DC | PRN

## 2016-08-06 MED ORDER — NITROGLYCERIN 0.3 MG SL SUBL
0.3000 mg | SUBLINGUAL_TABLET | SUBLINGUAL | Status: DC | PRN
  Filled 2016-08-06: qty 100

## 2016-08-06 MED ORDER — ACETAMINOPHEN 325 MG PO TABS: 650.0000 mg | ORAL_TABLET | Freq: Four times a day (QID) | ORAL | Status: DC | PRN

## 2016-08-06 MED ORDER — PREDNISONE 5 MG PO TABS
10.0000 mg | ORAL_TABLET | Freq: Every day | ORAL | Status: DC
  Administered 2016-08-07: 10 mg via ORAL
  Filled 2016-08-06: qty 2

## 2016-08-06 MED ORDER — AMLODIPINE BESYLATE 10 MG PO TABS
10.0000 mg | ORAL_TABLET | Freq: Every day | ORAL | Status: DC
  Administered 2016-08-07: 10 mg via ORAL
  Filled 2016-08-06: qty 1

## 2016-08-06 MED ORDER — ACETAMINOPHEN 650 MG RE SUPP: 650.0000 mg | Freq: Four times a day (QID) | RECTAL | Status: DC | PRN

## 2016-08-06 MED ORDER — DEXTROSE 5 % IV SOLN
500.0000 mg | Freq: Once | INTRAVENOUS | Status: AC
  Administered 2016-08-06: 500 mg via INTRAVENOUS
  Filled 2016-08-06: qty 500

## 2016-08-06 MED ORDER — ALBUTEROL SULFATE (2.5 MG/3ML) 0.083% IN NEBU
3.0000 mL | INHALATION_SOLUTION | Freq: Four times a day (QID) | RESPIRATORY_TRACT | Status: DC | PRN
  Administered 2016-08-07: 3 mL via RESPIRATORY_TRACT
  Filled 2016-08-06: qty 3

## 2016-08-06 MED ORDER — ONDANSETRON 4 MG PO TBDP: 8.0000 mg | ORAL_TABLET | Freq: Three times a day (TID) | ORAL | Status: DC | PRN

## 2016-08-06 MED ORDER — ONDANSETRON HCL 4 MG/2ML IJ SOLN
4.0000 mg | Freq: Four times a day (QID) | INTRAMUSCULAR | Status: DC | PRN
  Filled 2016-08-06: qty 2

## 2016-08-06 MED ORDER — DILTIAZEM HCL ER COATED BEADS 180 MG PO CP24
180.0000 mg | ORAL_CAPSULE | Freq: Every day | ORAL | Status: DC
  Administered 2016-08-07: 180 mg via ORAL
  Filled 2016-08-06: qty 1

## 2016-08-06 MED ORDER — GABAPENTIN 100 MG PO CAPS
100.0000 mg | ORAL_CAPSULE | Freq: Three times a day (TID) | ORAL | Status: DC
  Administered 2016-08-06 – 2016-08-09 (×10): 100 mg via ORAL
  Filled 2016-08-06 (×10): qty 1

## 2016-08-06 MED ORDER — POLYETHYLENE GLYCOL 3350 17 G PO PACK: 17.0000 g | PACK | Freq: Every day | ORAL | Status: DC | PRN

## 2016-08-06 NOTE — ED Provider Notes (Signed)
[facility] DEPT Provider Note   CSN: [id number] Arrival date & time: 08/06/16  1152     History   Chief Complaint Chief Complaint  Patient presents with  . Shortness of Breath    HPI Zachary Cooper is a 66 y.o. male.  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  Patient is in poor health and appears very frail.  Bedside is a close family member who participates in his care.  She reports that over the last week he has become more fatigued, unhappy and slightly confused.  She notes last week he did have a fall, uncertain if he struck his head.  CT scan has been ordered but not performed at this point.  She notes that she was there this morning when EMS arrived, patient was severely short of breath and looked uncomfortable.  She reports no preceding cough or known illness.  Patient notes at that time he was having centralized chest pain which has now resolved.  He denies any infectious etiology, denies any radiation of symptoms, no swelling of the lower extremities.  Pt does note that symptoms have been happening over the last week, but became more severe this morning.   Patient is on 2 L at home, currently taking prednisone.  He received 2 breathing treatments prior to my assessment, reports that his shortness of breath has significantly improved.  Patient's.  Systemic treatment is Keytruda  Q 3 weeks last dose 08/02/16  Per Dr. Marlene Lard notes patient saturation on room air at rest 93%, saturations on room air when ambulating 82%, saturations on 2 L of oxygen while ambulating 94%      Past Medical History:  Diagnosis Date  . Anemia   . Blood transfusion without reported diagnosis   . Cancer ([place])   . CHF (congestive heart failure) ([place])   . Chronic kidney disease   . Clotting disorder ([place])   . COPD (chronic obstructive  pulmonary disease) ([place])   . Hypertension   . Myocardial infarction   . Seizures ([place])   . Stroke [facility])     Patient Active Problem List   Diagnosis Date Noted  . CAP (community acquired pneumonia) 08/06/2016  . Dehydration 08/02/2016  . Iron deficiency anemia 07/12/2016  . Chronic respiratory failure ([place]) 07/05/2016  . 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 Pancytopenia, acquired ([place]) 06/21/2016  . Deficiency anemia 06/21/2016  . Protein-calorie malnutrition, moderate ([place]) 05/10/2016  . Physical deconditioning 05/10/2016  . Cancer associated pain 04/16/2016  . Anemia of chronic disease 03/31/2016  . Poor venous access 03/14/2016  . Pulmonary embolism ([place]) 03/14/2016  . Hypopharyngeal cancer ([place]) 03/13/2016  . Encounter for antineoplastic chemotherapy 03/13/2016  . Metastasis to lung ([place]) 03/13/2016  . COPD GOLD III 03/13/2016  . Angina pectoris ([place]) 03/13/2016  . DNR (do not resuscitate) 03/13/2016  . CHF (congestive heart failure) ([place]) 03/13/2016  . Goals of care, counseling/discussion 03/13/2016    Past Surgical History:  Procedure Laterality Date  . GASTROSTOMY W/ FEEDING TUBE    . IR GENERIC HISTORICAL  06/13/2016   IR FLUORO GUIDE PORT INSERTION RIGHT 06/13/2016 Markus Daft, MD [facility]INTER[facility]  . IR GENERIC HISTORICAL  06/13/2016   IR US GUIDE VASC ACCESS RIGHT 06/13/2016 Markus Daft, MD [facility]  . PICC LINE REMOVAL ([place] HX)    . PORT-A-CATH REMOVAL    . TRACHEOSTOMY  Home Medications    Prior to Admission medications   Medication Sig Start Date End Date Taking? Authorizing Provider  albuterol (ACCUNEB) 1.25 MG/3ML nebulizer solution Take 3 mLs (1.25 mg total) by nebulization every 6 (six) hours as needed for wheezing or shortness of breath. 03/22/16  Yes Heath Lark, MD  albuterol (PROVENTIL HFA;VENTOLIN HFA) 108 (90 Base) MCG/ACT inhaler Inhale 2 puffs into the lungs every 4 (four) hours as needed for wheezing or shortness of breath. 03/22/16  Yes Heath Lark, MD  amLODipine (NORVASC) 10 MG tablet Take 10 mg by mouth daily.   Yes Historical Provider, MD  budesonide-formoterol (SYMBICORT) 160-4.5 MCG/ACT inhaler Inhale 2 puffs into the lungs 2 (two) times daily. 03/29/16  Yes Tanda Rockers, MD  diltiazem (CARDIZEM CD) 180 MG 24 hr capsule Take 1 capsule (180 mg total) by mouth daily. 03/22/16 03/22/17 Yes Heath Lark, MD  gabapentin (NEURONTIN) 100 MG capsule Take 1 capsule (100 mg total) by mouth at bedtime. Patient taking differently: Take 100 mg by mouth 3 (three) times daily.  03/22/16  Yes Heath Lark, MD  isosorbide mononitrate (ISMO,MONOKET) 20 MG tablet Take 20 mg by mouth daily.   Yes Historical Provider, MD  lidocaine-prilocaine (EMLA) cream Apply 1 application topically as needed. Apply to [facility] a Cath site at least one hour prior to needle stick 05/10/16  Yes Heath Lark, MD  magnesium oxide (MAG-OX) 400 MG tablet Take 1 tablet (400 mg total) by mouth daily. 03/22/16  Yes Heath Lark, MD  mirtazapine (REMERON) 15 MG tablet Take 1 tablet (15 mg total) by mouth at bedtime. 06/21/16  Yes Heath Lark, MD  nitroGLYCERIN (NITROSTAT) 0.3 MG SL tablet Place 1 tablet (0.3 mg total) under the tongue every 5 (five) minutes as needed for chest pain. 03/13/16  Yes Heath Lark, MD  OLANZapine (ZYPREXA) 5 MG tablet Take 1 tablet (5 mg total) by mouth at bedtime. 03/22/16  Yes Heath Lark, MD  ondansetron (ZOFRAN-ODT) 8 MG disintegrating tablet Take 1 tablet (8 mg total) by mouth every 8 (eight) hours as needed for nausea or vomiting. 03/22/16  Yes Heath Lark, MD  oxyCODONE (ROXICODONE) 15 MG immediate release tablet Take 1 tablet (15 mg total) by mouth every 4 (four) hours as needed for severe pain. 08/02/16  Yes Heath Lark, MD  pantoprazole (PROTONIX) 40 MG tablet Take 1 tablet (40 mg total) by mouth daily. 03/22/16  Yes Heath Lark, MD  predniSONE (DELTASONE) 10 MG tablet Take 1 tablet (10 mg total) by mouth daily with breakfast. 07/12/16  Yes Heath Lark, MD    prochlorperazine (COMPAZINE) 10 MG tablet Take 1 tablet (10 mg total) by mouth every 6 (six) hours as needed for nausea or vomiting. 05/31/16  Yes Heath Lark, MD  rivaroxaban (XARELTO) 20 MG TABS tablet Take 1 tablet (20 mg total) by mouth daily with supper. 03/22/16  Yes Heath Lark, MD  Travoprost, BAK Free, (TRAVATAN) 0.004 % SOLN ophthalmic solution Place 1 drop into the left eye at bedtime.    Yes Historical Provider, MD  isosorbide dinitrate (ISORDIL) 20 MG tablet Take 1 tablet (20 mg total) by mouth 2 (two) times daily. Patient not taking: Reported on 08/06/2016 03/22/16   Heath Lark, MD  Oxycodone HCl 10 MG TABS Take 1 tablet (10 mg total) by mouth every 4 (four) hours as needed. Patient not taking: Reported on 08/06/2016 07/12/16   Heath Lark, MD    Family History History reviewed. No pertinent family history.  Social History Social  History  Substance Use Topics  . Smoking status: Former Smoker    Packs/day: 1.00    Years: 35.00    Types: Cigarettes    Quit date: 02/08/2016  . Smokeless tobacco: Never Used  . Alcohol use 3.6 oz/week    6 Cans of beer per week     Allergies   Lisinopril   Review of Systems Review of Systems  All other systems reviewed and are negative.  Physical Exam Updated Vital Signs BP 101/68 (BP Location: Left Arm)   Pulse 78   Temp 98 F (36.7 C) (Oral)   Resp 16   Ht 6\' 1"  (1.854 m)   Wt 71.9 kg   SpO2 99%   BMI 20.91 kg/m   Physical Exam  Constitutional: He is oriented to person, place, and time. He appears well-developed and well-nourished.  HENT:  Head: Normocephalic and atraumatic.  Eyes: Conjunctivae are normal. Pupils are equal, round, and reactive to light. Right eye exhibits no discharge. Left eye exhibits no discharge. No scleral icterus.  Neck: Normal range of motion. No JVD present. No tracheal deviation present.  Cardiovascular:  Irregularly irregular rhythm  Pulmonary/Chest: Effort normal. No stridor. No respiratory  distress. He has no wheezes.  Abdominal: Soft.  Neurological: He is alert and oriented to person, place, and time. Coordination normal.  Psychiatric: He has a normal mood and affect. His behavior is normal. Judgment and thought content normal.  Nursing note and vitals reviewed.    ED Treatments / Results  Labs (all labs ordered are listed, but only abnormal results are displayed) Labs Reviewed  BASIC METABOLIC PANEL - Abnormal; Notable for the following:       Result Value   Glucose, Bld 117 (*)    All other components within normal limits  CBC - Abnormal; Notable for the following:    RBC 3.92 (*)    Hemoglobin 10.0 (*)    HCT 32.1 (*)    MCH 25.5 (*)    RDW 19.7 (*)    All other components within normal limits  BRAIN NATRIURETIC PEPTIDE - Abnormal; Notable for the following:    B Natriuretic Peptide 377.1 (*)    All other components within normal limits  CULTURE, BLOOD (ROUTINE X 2)  CULTURE, BLOOD (ROUTINE X 2)  CULTURE, EXPECTORATED SPUTUM-ASSESSMENT  PROCALCITONIN  HIV ANTIBODY (ROUTINE TESTING)  STREP PNEUMONIAE URINARY ANTIGEN  CBC  BASIC METABOLIC PANEL  I-STAT CG4 LACTIC ACID, ED  I-STAT TROPOININ, ED  I-STAT CG4 LACTIC ACID, ED    EKG  EKG Interpretation None       Radiology Dg Chest 2 View  Result Date: 08/06/2016 CLINICAL DATA:  Shortness of breath and weakness, lung cancer. Fall last week, right rib pain, initial encounter. EXAM: CHEST  2 VIEW COMPARISON:  CT chest 04/10/2016 and chest radiograph 03/29/2016. FINDINGS: Trachea is midline. Left IJ power port tip projects at the SVC RA junction. Heart size stable. Irregular nodular lesion in the right suprahilar region is new. Left suprahilar irregular nodular consolidation is grossly unchanged. Mild right basilar volume loss. Bullous changes are seen anteriorly. No definite pleural fluid. IMPRESSION: 1. New irregular nodular opacification in the right suprahilar region may be infectious or inflammatory in  etiology. Metastatic disease is not excluded. CT chest with contrast may be helpful in further evaluation, as clinically indicated. 2. Irregular left upper lobe nodularity, similar and better evaluated on 04/10/2016. 3. Mild right basilar volume loss may be due to scarring. Difficult to exclude pneumonia.  Electronically Signed   By: Lorin Picket M.D.   On: 08/06/2016 13:11    Procedures Procedures (including critical care time)  Medications Ordered in ED Medications  rivaroxaban (XARELTO) tablet 20 mg (20 mg Oral Given 08/06/16 1722)  latanoprost (XALATAN) 0.005 % ophthalmic solution 1 drop (not administered)  prochlorperazine (COMPAZINE) tablet 10 mg (not administered)  predniSONE (DELTASONE) tablet 10 mg (not administered)  pantoprazole (PROTONIX) EC tablet 40 mg (not administered)  oxyCODONE (Oxy IR/ROXICODONE) immediate release tablet 15 mg (not administered)  OLANZapine (ZYPREXA) tablet 5 mg (not administered)  nitroGLYCERIN (NITROSTAT) SL tablet 0.3 mg (not administered)  mirtazapine (REMERON) tablet 15 mg (not administered)  magnesium oxide (MAG-OX) tablet 400 mg (not administered)  gabapentin (NEURONTIN) capsule 100 mg (100 mg Oral Given 08/06/16 1722)  diltiazem (CARDIZEM CD) 24 hr capsule 180 mg (not administered)  mometasone-formoterol (DULERA) 200-5 MCG/ACT inhaler 2 puff (2 puffs Inhalation Given 08/06/16 1954)  amLODipine (NORVASC) tablet 10 mg (not administered)  albuterol (PROVENTIL) (2.5 MG/3ML) 0.083% nebulizer solution 3 mL (not administered)  acetaminophen (TYLENOL) tablet 650 mg (not administered)    Or  acetaminophen (TYLENOL) suppository 650 mg (not administered)  polyethylene glycol (MIRALAX / GLYCOLAX) packet 17 g (not administered)  ondansetron (ZOFRAN) tablet 4 mg (not administered)    Or  ondansetron (ZOFRAN) injection 4 mg (not administered)  cefTRIAXone (ROCEPHIN) 1 g in dextrose 5 % 50 mL IVPB (0 g Intravenous Stopped 08/06/16 1532)  azithromycin  (ZITHROMAX) 500 mg in dextrose 5 % 250 mL IVPB (500 mg Intravenous New Bag/Given 08/06/16 1535)     Initial Impression / Assessment and Plan / ED Course  I have reviewed the triage vital signs and the nursing notes.  Pertinent labs & imaging results that were available during my care of the patient were reviewed by me and considered in my medical decision making (see chart for details).      Final Clinical Impressions(s) / ED Diagnoses   Final diagnoses:  Community acquired pneumonia, unspecified laterality     Assessment/Plan: 66 year old male presents today with likely COPD exacerbation secondary to likely pneumonia.  Patient has had symptoms over the last week worsening today was severely hypoxic in acute distress this morning upon EMS arrival.  Patient had breathing treatments prior to my evaluation and seemed improved.  Chest x-ray with findings showing questionable pneumonia.  Due to patient's presentation he will be started on community-acquired antibiotics, hospitalist consulted for admission.       New Prescriptions Current Discharge Medication List       Okey Regal, PA-C 08/06/16 2219

## 2016-08-06 NOTE — H&P (Signed)
History and Physical  Zachary Cooper [id number] DOB: 18-Oct-1950 DOA: 08/06/2016  Referring physician: Okey Regal, ER PA  PCP: No PCP Per Patient  Outpatient Specialists: Heath Lark, oncology Patient coming from: Home & is able to ambulate without assistance  Chief Complaint: Shortness of breath   HPI: Zachary Cooper is a 66 y.o. 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 In emergency room, patient was quite dyspneic although not wheezing. Vision received 2 breathing treatments and by time that he arrives in emergency him, his breathing had improved.  ED Course: Emergency room, he was monitored. Chest x-ray noted questionable pneumonia. Rest of his labs looked to be at baseline with a normal white blood cell count and good renal function. Because of reports of how dyspneic he was initially, it was felt best to try to watch him overnight. Hospitalists were called for further evaluation  Review of Systems: Patient seen after arrival to floor . Pt complains of a little tired and hungry. He says his breathing is doing much better although he says the worst of his breathing is usually in the mornings   Pt denies any headaches, vision changes, dysphagia, chest pain, palpitations, shortness of breath currently, wheeze, cough, abdominal pain, hematuria, dysuria, constipation, diarrhea, focal extremity numbness weakness or pain .  Review of systems are otherwise negative   Past Medical History:  Diagnosis Date  . Anemia   . Blood transfusion without reported diagnosis   . Cancer ([place])   . CHF (congestive heart failure) ([place])   . Chronic kidney disease   . Clotting disorder ([place])   . COPD (chronic obstructive  pulmonary disease) ([place])   . Hypertension   . Myocardial infarction   . Seizures ([place])   . Stroke [facility])    Past Surgical History:  Procedure Laterality Date  . GASTROSTOMY W/ FEEDING TUBE    . IR GENERIC HISTORICAL  06/13/2016   IR FLUORO GUIDE PORT INSERTION RIGHT 06/13/2016 Markus Daft, MD [facility]INTER[facility]  . IR GENERIC HISTORICAL  06/13/2016   IR US GUIDE VASC ACCESS RIGHT 06/13/2016 Markus Daft, MD [facility]  . PICC LINE REMOVAL ([place] HX)    . PORT-A-CATH REMOVAL    . TRACHEOSTOMY      Social History:  reports that he quit smoking about 5 months ago. His smoking use included Cigarettes. He has a 35.00 pack-year smoking history. He has never used smokeless tobacco. He reports that he drinks about 3.6 oz of alcohol per week . He reports that he does not use drugs.  Patient lives with his niece   Allergies  Allergen Reactions  . Lisinopril Swelling and Other (See Comments)    Reaction:  Mouth swelling    Family history: Heart problems in multiple family members as per patient  Prior to Admission medications   Medication Sig Start Date End Date Taking? Authorizing Provider  albuterol (ACCUNEB) 1.25 MG/3ML nebulizer solution Take 3 mLs (1.25 mg total) by nebulization every 6 (six) hours as needed for wheezing or shortness of breath. 03/22/16  Yes Heath Lark, MD  albuterol (PROVENTIL HFA;VENTOLIN HFA) 108 (90 Base) MCG/ACT inhaler Inhale 2 puffs into the lungs every 4 (four) hours as needed for wheezing or shortness of breath. 03/22/16  Yes Ni  Alvy Bimler, MD  amLODipine (NORVASC) 10 MG tablet Take 10 mg by mouth daily.   Yes Historical Provider, MD  budesonide-formoterol (SYMBICORT) 160-4.5 MCG/ACT inhaler Inhale 2 puffs into the lungs 2 (two) times daily. 03/29/16  Yes Tanda Rockers, MD  diltiazem (CARDIZEM CD) 180 MG 24 hr capsule Take 1 capsule (180 mg total) by mouth daily. 03/22/16 03/22/17 Yes Heath Lark, MD  gabapentin (NEURONTIN) 100 MG capsule Take 1 capsule (100 mg total) by mouth at  bedtime. Patient taking differently: Take 100 mg by mouth 3 (three) times daily.  03/22/16  Yes Heath Lark, MD  isosorbide mononitrate (ISMO,MONOKET) 20 MG tablet Take 20 mg by mouth daily.   Yes Historical Provider, MD  lidocaine-prilocaine (EMLA) cream Apply 1 application topically as needed. Apply to [facility] a Cath site at least one hour prior to needle stick 05/10/16  Yes Heath Lark, MD  magnesium oxide (MAG-OX) 400 MG tablet Take 1 tablet (400 mg total) by mouth daily. 03/22/16  Yes Heath Lark, MD  mirtazapine (REMERON) 15 MG tablet Take 1 tablet (15 mg total) by mouth at bedtime. 06/21/16  Yes Heath Lark, MD  nitroGLYCERIN (NITROSTAT) 0.3 MG SL tablet Place 1 tablet (0.3 mg total) under the tongue every 5 (five) minutes as needed for chest pain. 03/13/16  Yes Heath Lark, MD  OLANZapine (ZYPREXA) 5 MG tablet Take 1 tablet (5 mg total) by mouth at bedtime. 03/22/16  Yes Heath Lark, MD  ondansetron (ZOFRAN-ODT) 8 MG disintegrating tablet Take 1 tablet (8 mg total) by mouth every 8 (eight) hours as needed for nausea or vomiting. 03/22/16  Yes Heath Lark, MD  oxyCODONE (ROXICODONE) 15 MG immediate release tablet Take 1 tablet (15 mg total) by mouth every 4 (four) hours as needed for severe pain. 08/02/16  Yes Heath Lark, MD  pantoprazole (PROTONIX) 40 MG tablet Take 1 tablet (40 mg total) by mouth daily. 03/22/16  Yes Heath Lark, MD  predniSONE (DELTASONE) 10 MG tablet Take 1 tablet (10 mg total) by mouth daily with breakfast. 07/12/16  Yes Heath Lark, MD  prochlorperazine (COMPAZINE) 10 MG tablet Take 1 tablet (10 mg total) by mouth every 6 (six) hours as needed for nausea or vomiting. 05/31/16  Yes Heath Lark, MD  rivaroxaban (XARELTO) 20 MG TABS tablet Take 1 tablet (20 mg total) by mouth daily with supper. 03/22/16  Yes Heath Lark, MD  Travoprost, BAK Free, (TRAVATAN) 0.004 % SOLN ophthalmic solution Place 1 drop into the left eye at bedtime.    Yes Historical Provider, MD  isosorbide dinitrate (ISORDIL)  20 MG tablet Take 1 tablet (20 mg total) by mouth 2 (two) times daily. Patient not taking: Reported on 08/06/2016 03/22/16   Heath Lark, MD  Oxycodone HCl 10 MG TABS Take 1 tablet (10 mg total) by mouth every 4 (four) hours as needed. Patient not taking: Reported on 08/06/2016 07/12/16   Heath Lark, MD    Physical Exam: BP 106/70 (BP Location: Left Arm)   Pulse 83   Temp 98 F (36.7 C) (Oral)   Resp 18   Ht 6\' 1"  (1.854 m)   Wt 71.9 kg (158 lb 8.2 oz)   SpO2 97%   BMI 20.91 kg/m   General:  Alert and oriented 3, no acute distress, fatigued  Eyes: Sclera nonicteric [place] movements are intact  ENT: Normocephalic, atraumatic, no extremity slightly dry  Neck: Supple, no JVD Cardiovascular: Regular rate and rhythm, S1-S2, occasional ectopic beat  Respiratory: decreased breath sounds throughout  Abdomen: soft, nontender, nondistended, positive bowel sounds  Skin: noted Port-A-Cath in chest, otherwise no skin breaks, tears or lesions  Musculoskeletal: no clubbing or cyanosis, trace pitting edema  Psychiatric: patient is appropriate, no evidence of psychoses  Neurologic: no focal deficits           Labs on Admission:  Basic Metabolic Panel:  Recent Labs Lab 08/02/16 1216 08/06/16 1226  NA 137 137  K 3.4* 3.6  CL  --  101  CO2 27 29  GLUCOSE 97 117*  BUN 25.9 9  CREATININE 0.9 0.95  CALCIUM 9.3 9.5   Liver Function Tests:  Recent Labs Lab 08/02/16 1216  AST 45*  ALT 25  ALKPHOS 104  BILITOT 1.24*  PROT 7.6  ALBUMIN 2.8*   No results for input(s): LIPASE, AMYLASE in the last 168 hours. No results for input(s): AMMONIA in the last 168 hours. CBC:  Recent Labs Lab 08/02/16 1216 08/06/16 1226  WBC 6.3 6.8  NEUTROABS 5.0  --   HGB 10.9* 10.0*  HCT 34.8* 32.1*  MCV 81.7 81.9  PLT 235 238   Cardiac Enzymes: No results for input(s): CKTOTAL, CKMB, CKMBINDEX, TROPONINI in the last 168 hours.  BNP (last 3 results) No results for input(s): BNP in the last  8760 hours.  ProBNP (last 3 results) No results for input(s): PROBNP in the last 8760 hours.  CBG: No results for input(s): GLUCAP in the last 168 hours.  Radiological Exams on Admission: Dg Chest 2 View  Result Date: 08/06/2016 CLINICAL DATA:  Shortness of breath and weakness, lung cancer. Fall last week, right rib pain, initial encounter. EXAM: CHEST  2 VIEW COMPARISON:  CT chest 04/10/2016 and chest radiograph 03/29/2016. FINDINGS: Trachea is midline. Left IJ power port tip projects at the SVC RA junction. Heart size stable. Irregular nodular lesion in the right suprahilar region is new. Left suprahilar irregular nodular consolidation is grossly unchanged. Mild right basilar volume loss. Bullous changes are seen anteriorly. No definite pleural fluid. IMPRESSION: 1. New irregular nodular opacification in the right suprahilar region may be infectious or inflammatory in etiology. Metastatic disease is not excluded. CT chest with contrast may be helpful in further evaluation, as clinically indicated. 2. Irregular left upper lobe nodularity, similar and better evaluated on 04/10/2016. 3. Mild right basilar volume loss may be due to scarring. Difficult to exclude pneumonia. Electronically Signed   By: Lorin Picket M.D.   On: 08/06/2016 13:11    EKG: Independently reviewed. Sinus arrhythmia   Assessment/Plan Present on Admission: . CAP (community acquired pneumonia) in patient with history of COPD Gold, hypopharyngeal cancer with past deceased of the lungs and chronic respiratory failure with hypoxia on 2 his baseline oxygen by nasal cannula: Unclear etiology. This may or may not be pneumonia. Would continue nebulizers and he is at his home base of oxygen. We'll check a pro-calcitonin level. Antibiotics for now. A pro-calcitonin level was normal then will discontinue antibiotics. No wheezing. No need for steroids at this point . Hypopharyngeal cancer ([place]) with metastases to lung: Followed by  oncology. He has actually responding to chemotherapy  . Anemia of chronic disease: Stable . Protein-calorie malnutrition, moderate ([place]): Nutrition to see   Active Problems:   Hypopharyngeal cancer (H[facility])   COPD GOLD III   Anemia of chronic disease   Protein-calorie malnutrition, moderate ([facility])   Chronic respiratory failure ([facility])   CAP (community acquired pneumonia)   DVT prophylaxis: Continue xarelto   Code Status: DO NOT RESUSCITATE  Family Communication: Left message for niece   Disposition Plan: Anticipate discharge tomorrow as long as breathing is stable   Consults called: None   Admission status: Given potential discharge tomorrow and stability patient, will lay's under observation     Annita Brod MD [facility] Pager [phone]  If 7PM-7AM, please contact night-coverage [email or web address] Password [facility]  08/06/2016, 4:54 PM

## 2016-08-06 NOTE — ED Triage Notes (Signed)
Per EMS, pt from home. Pt with hx of COPD, lung cancer, a-fib.  2 - Duonebs in route.  84% ra.  Alert and oriented.  Vitals:  116/65, hr 90-120, a-fib, 99-100 neb.

## 2016-08-06 NOTE — ED Notes (Signed)
Bed: [id number] Expected date:  Expected time:  Means of arrival:  Comments: 66 yo M SOB

## 2016-08-06 NOTE — ED Notes (Signed)
ED Provider at bedside. EDP ALLEN 

## 2016-08-06 NOTE — ED Provider Notes (Signed)
Medical screening examination/treatment/procedure(s) were conducted as a shared visit with non-physician practitioner(s) and myself.  I personally evaluated the patient during the encounter.   EKG Interpretation None     66 year old male with history of COPD presents here short of breath. Chest x-ray shows possible infiltrate. Will start patient on antibiotics. Treated for COPD exacerbation will be admitted to the hospitalist service.   Lacretia Leigh, MD 08/06/16 1425

## 2016-08-07 ENCOUNTER — Encounter (HOSPITAL_COMMUNITY): Payer: Self-pay | Admitting: Radiology

## 2016-08-07 ENCOUNTER — Observation Stay (HOSPITAL_COMMUNITY): Payer: Medicare Other

## 2016-08-07 ENCOUNTER — Other Ambulatory Visit: Payer: Self-pay

## 2016-08-07 DIAGNOSIS — C139 Malignant neoplasm of hypopharynx, unspecified: Secondary | ICD-10-CM

## 2016-08-07 DIAGNOSIS — J441 Chronic obstructive pulmonary disease with (acute) exacerbation: Principal | ICD-10-CM

## 2016-08-07 DIAGNOSIS — C7800 Secondary malignant neoplasm of unspecified lung: Secondary | ICD-10-CM

## 2016-08-07 DIAGNOSIS — C7802 Secondary malignant neoplasm of left lung: Secondary | ICD-10-CM | POA: Diagnosis present

## 2016-08-07 DIAGNOSIS — Z86711 Personal history of pulmonary embolism: Secondary | ICD-10-CM | POA: Diagnosis not present

## 2016-08-07 DIAGNOSIS — Z515 Encounter for palliative care: Secondary | ICD-10-CM | POA: Diagnosis present

## 2016-08-07 DIAGNOSIS — E440 Moderate protein-calorie malnutrition: Secondary | ICD-10-CM

## 2016-08-07 DIAGNOSIS — D509 Iron deficiency anemia, unspecified: Secondary | ICD-10-CM

## 2016-08-07 DIAGNOSIS — Z86718 Personal history of other venous thrombosis and embolism: Secondary | ICD-10-CM | POA: Diagnosis not present

## 2016-08-07 DIAGNOSIS — J9601 Acute respiratory failure with hypoxia: Secondary | ICD-10-CM | POA: Diagnosis not present

## 2016-08-07 DIAGNOSIS — Z7901 Long term (current) use of anticoagulants: Secondary | ICD-10-CM | POA: Diagnosis not present

## 2016-08-07 DIAGNOSIS — R0602 Shortness of breath: Secondary | ICD-10-CM | POA: Diagnosis present

## 2016-08-07 DIAGNOSIS — J189 Pneumonia, unspecified organism: Secondary | ICD-10-CM | POA: Diagnosis not present

## 2016-08-07 DIAGNOSIS — Z87891 Personal history of nicotine dependence: Secondary | ICD-10-CM | POA: Diagnosis not present

## 2016-08-07 DIAGNOSIS — J9621 Acute and chronic respiratory failure with hypoxia: Secondary | ICD-10-CM | POA: Diagnosis present

## 2016-08-07 DIAGNOSIS — G893 Neoplasm related pain (acute) (chronic): Secondary | ICD-10-CM | POA: Diagnosis present

## 2016-08-07 DIAGNOSIS — Z66 Do not resuscitate: Secondary | ICD-10-CM | POA: Diagnosis present

## 2016-08-07 DIAGNOSIS — Z8673 Personal history of transient ischemic attack (TIA), and cerebral infarction without residual deficits: Secondary | ICD-10-CM | POA: Diagnosis not present

## 2016-08-07 DIAGNOSIS — R410 Disorientation, unspecified: Secondary | ICD-10-CM | POA: Diagnosis not present

## 2016-08-07 DIAGNOSIS — Z9981 Dependence on supplemental oxygen: Secondary | ICD-10-CM | POA: Diagnosis not present

## 2016-08-07 DIAGNOSIS — I480 Paroxysmal atrial fibrillation: Secondary | ICD-10-CM

## 2016-08-07 DIAGNOSIS — Z7189 Other specified counseling: Secondary | ICD-10-CM | POA: Diagnosis not present

## 2016-08-07 DIAGNOSIS — I2699 Other pulmonary embolism without acute cor pulmonale: Secondary | ICD-10-CM | POA: Diagnosis not present

## 2016-08-07 DIAGNOSIS — D638 Anemia in other chronic diseases classified elsewhere: Secondary | ICD-10-CM | POA: Diagnosis present

## 2016-08-07 DIAGNOSIS — R918 Other nonspecific abnormal finding of lung field: Secondary | ICD-10-CM | POA: Diagnosis not present

## 2016-08-07 DIAGNOSIS — J9600 Acute respiratory failure, unspecified whether with hypoxia or hypercapnia: Secondary | ICD-10-CM

## 2016-08-07 DIAGNOSIS — I252 Old myocardial infarction: Secondary | ICD-10-CM | POA: Diagnosis not present

## 2016-08-07 DIAGNOSIS — I2510 Atherosclerotic heart disease of native coronary artery without angina pectoris: Secondary | ICD-10-CM | POA: Diagnosis present

## 2016-08-07 DIAGNOSIS — Z923 Personal history of irradiation: Secondary | ICD-10-CM | POA: Diagnosis not present

## 2016-08-07 DIAGNOSIS — Z6820 Body mass index (BMI) 20.0-20.9, adult: Secondary | ICD-10-CM | POA: Diagnosis not present

## 2016-08-07 DIAGNOSIS — C7801 Secondary malignant neoplasm of right lung: Secondary | ICD-10-CM | POA: Diagnosis present

## 2016-08-07 LAB — BASIC METABOLIC PANEL
ANION GAP: 7 (ref 5–15)
BUN: 12 mg/dL (ref 6–20)
CALCIUM: 9.4 mg/dL (ref 8.9–10.3)
CO2: 29 mmol/L (ref 22–32)
CREATININE: 0.9 mg/dL (ref 0.61–1.24)
Chloride: 103 mmol/L (ref 101–111)
GFR calc Af Amer: >60 mL/min (ref >60)
GLUCOSE: 110 mg/dL — AB (ref 65–99)
Potassium: 3.6 mmol/L (ref 3.5–5.1)
Sodium: 139 mmol/L (ref 135–145)

## 2016-08-07 LAB — CBC
HEMATOCRIT: 29.6 % — AB (ref 39.0–52.0)
Hemoglobin: 9.3 g/dL — ABNORMAL LOW (ref 13.0–17.0)
MCH: 25.8 pg — ABNORMAL LOW (ref 26.0–34.0)
MCHC: 31.4 g/dL (ref 30.0–36.0)
MCV: 82 fL (ref 78.0–100.0)
PLATELETS: 211 10*3/uL (ref 150–400)
RBC: 3.61 MIL/uL — ABNORMAL LOW (ref 4.22–5.81)
RDW: 19.7 % — AB (ref 11.5–15.5)
WBC: 4.9 10*3/uL (ref 4.0–10.5)

## 2016-08-07 MED ORDER — METHYLPREDNISOLONE SODIUM SUCC 40 MG IJ SOLR
40.0000 mg | Freq: Two times a day (BID) | INTRAMUSCULAR | Status: DC
  Administered 2016-08-07 – 2016-08-09 (×5): 40 mg via INTRAVENOUS
  Filled 2016-08-07 (×4): qty 1

## 2016-08-07 MED ORDER — METHYLPREDNISOLONE SODIUM SUCC 40 MG IJ SOLR
INTRAMUSCULAR | Status: AC
  Administered 2016-08-07: 15:00:00
  Filled 2016-08-07: qty 1

## 2016-08-07 MED ORDER — ENSURE ENLIVE PO LIQD
237.0000 mL | Freq: Two times a day (BID) | ORAL | Status: DC
  Administered 2016-08-08 (×2): 237 mL via ORAL

## 2016-08-07 MED ORDER — IPRATROPIUM-ALBUTEROL 0.5-2.5 (3) MG/3ML IN SOLN
3.0000 mL | Freq: Three times a day (TID) | RESPIRATORY_TRACT | Status: DC
  Administered 2016-08-08 – 2016-08-09 (×5): 3 mL via RESPIRATORY_TRACT
  Filled 2016-08-07 (×5): qty 3

## 2016-08-07 MED ORDER — ALBUTEROL SULFATE (2.5 MG/3ML) 0.083% IN NEBU
3.0000 mL | INHALATION_SOLUTION | RESPIRATORY_TRACT | Status: DC | PRN
  Administered 2016-08-07 – 2016-08-08 (×2): 3 mL via RESPIRATORY_TRACT
  Filled 2016-08-07: qty 3

## 2016-08-07 MED ORDER — ALBUTEROL SULFATE (2.5 MG/3ML) 0.083% IN NEBU
INHALATION_SOLUTION | RESPIRATORY_TRACT | Status: AC
  Filled 2016-08-07: qty 3

## 2016-08-07 MED ORDER — IOPAMIDOL (ISOVUE-300) INJECTION 61%
INTRAVENOUS | Status: AC
  Administered 2016-08-07: 75 mL
  Administered 2016-08-07: 16:00:00
  Filled 2016-08-07: qty 75

## 2016-08-07 NOTE — Progress Notes (Signed)
PROGRESS NOTE    Zachary Cooper   [id number]  DOB: 09/30/1950  DOA: 08/06/2016 PCP: No PCP Per Patient   Brief Narrative:  Zachary Cooper is a 66 y.o. 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 No recent cough or fever. In emergency room, patient was quite dyspneic although not wheezing. He received 2 breathing treatments and his breathing had improved.  Subjective: Ambulated this AM without O2 and became very short of breath. Improved with rest and a neb treatment.  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 Tachycardia/ A-fib - per note from Dr Melvyn Novas, 10/17, he has a h/o A-fib with RVR - question of A-fib while on monitor temporarily today - HR jumping to 150s with mild exertion- may be having A-fib with RVR - transfer to telemetry - on Xarelto as he has a concurrent h/o DVT and PE  H/o DVT / PE - Xarelto per Dr Alvy Bimler    Anemia of chronic disease Hb is stable    Protein-calorie malnutrition, moderate and weight loss - nutrition consulted  Deconditioning - Dr Alvy Bimler has set up HHPT recently  Iron deficiency anemia He has recently received intravenous iron.  DVT prophylaxis: Xarelto Code Status: FDNR Family  Communication:  Disposition Plan: home when stable Consultants:   oncology Procedures:    Antimicrobials:  Anti-infectives    Start     Dose/Rate Route Frequency Ordered Stop   08/06/16 1415  cefTRIAXone (ROCEPHIN) 1 g in dextrose 5 % 50 mL IVPB     1 g 100 mL/hr over 30 Minutes Intravenous  Once 08/06/16 1408 08/06/16 1532   08/06/16 1415  azithromycin (ZITHROMAX) 500 mg in dextrose 5 % 250 mL IVPB     500 mg 250 mL/hr over 60 Minutes Intravenous  Once 08/06/16 1408 08/06/16 1635       Objective: Vitals:   08/07/16 0942 08/07/16 0945 08/07/16 1000 08/07/16 1016  BP:  131/78 126/80 121/73  Pulse:  81 89 77  Resp:  (!) 21 19 18   Temp:      TempSrc:      SpO2: 100% 100% 100% 100%  Weight:      Height:        Intake/Output Summary (Last 24 hours) at 08/07/16 1409 Last data filed at 08/07/16 0917  Gross per 24 hour  Intake              770 ml  Output              800 ml  Net              -30 ml   Filed Weights   08/06/16 1621  Weight: 71.9 kg (158 lb 8.2 oz)  Examination: General exam: Appears comfortable  HEENT: PERRLA, oral mucosa moist, no sclera icterus or thrush Respiratory system: Clear to auscultation. Respiratory effort normal. Cardiovascular system: S1 & S2 heard, RRR.  No murmurs  Gastrointestinal system: Abdomen soft, non-tender, nondistended. Normal bowel sound. No organomegaly Central nervous system: Alert and oriented. No focal neurological deficits. Extremities: No cyanosis, clubbing or edema Skin: No rashes or ulcers Psychiatry:  Mood & affect appropriate.     Data Reviewed: I have personally reviewed following labs and imaging studies  CBC:  Recent Labs Lab 08/02/16 1216 08/06/16 1226 08/07/16 0532  WBC 6.3 6.8 4.9  NEUTROABS 5.0  --   --   HGB 10.9* 10.0* 9.3*  HCT 34.8* 32.1* 29.6*  MCV 81.7 81.9 82.0  PLT 235 238 [id number]   Basic Metabolic Panel:  Recent Labs Lab 08/02/16 1216 08/06/16 1226 08/07/16 0532  NA 137 137 139    K 3.4* 3.6 3.6  CL  --  101 103  CO2 27 29 29   GLUCOSE 97 117* 110*  BUN 25.9 9 12   CREATININE 0.9 0.95 0.90  CALCIUM 9.3 9.5 9.4   GFR: Estimated Creatinine Clearance: 83.2 mL/min (by C-G formula based on SCr of 0.9 mg/dL). Liver Function Tests:  Recent Labs Lab 08/02/16 1216  AST 45*  ALT 25  ALKPHOS 104  BILITOT 1.24*  PROT 7.6  ALBUMIN 2.8*   No results for input(s): LIPASE, AMYLASE in the last 168 hours. No results for input(s): AMMONIA in the last 168 hours. Coagulation Profile: No results for input(s): INR, PROTIME in the last 168 hours. Cardiac Enzymes: No results for input(s): CKTOTAL, CKMB, CKMBINDEX, TROPONINI in the last 168 hours. BNP (last 3 results) No results for input(s): PROBNP in the last 8760 hours. HbA1C: No results for input(s): HGBA1C in the last 72 hours. CBG: No results for input(s): GLUCAP in the last 168 hours. Lipid Profile: No results for input(s): CHOL, HDL, LDLCALC, TRIG, CHOLHDL, LDLDIRECT in the last 72 hours. Thyroid Function Tests: No results for input(s): TSH, T4TOTAL, FREET4, T3FREE, THYROIDAB in the last 72 hours. Anemia Panel: No results for input(s): VITAMINB12, FOLATE, FERRITIN, TIBC, IRON, RETICCTPCT in the last 72 hours. Urine analysis: No results found for: COLORURINE, APPEARANCEUR, LABSPEC, PHURINE, GLUCOSEU, HGBUR, BILIRUBINUR, KETONESUR, PROTEINUR, UROBILINOGEN, NITRITE, LEUKOCYTESUR Sepsis Labs: @LABRCNTIP (procalcitonin:4,lacticidven:4) ) Recent Results (from the past 240 hour(s))  Culture, blood (routine x 2) Call MD if unable to obtain prior to antibiotics being given     Status: None (Preliminary result)   Collection Time: 08/06/16  5:34 PM  Result Value Ref Range Status   Specimen Description BLOOD LEFT HAND  Final   Special Requests IN PEDIATRIC BOTTLE 2CC  Final   Culture   Final    NO GROWTH < 12 HOURS Performed at [facility], [place] [address], [place], [place] [address]    Report Status PENDING   Incomplete  Culture, blood (routine x 2) Call MD if unable to obtain prior to antibiotics being given     Status: None (Preliminary result)   Collection Time: 08/06/16  5:40 PM  Result Value Ref Range Status   Specimen Description BLOOD LEFT HAND  Final   Special Requests IN PEDIATRIC BOTTLE 3 CC  Final   Culture   Final    NO GROWTH < 12 HOURS Performed at [facility], [place] [address] [place], [place] [address]    Report Status PENDING  Incomplete  Radiology Studies: Dg Chest 2 View  Result Date: 08/06/2016 CLINICAL DATA:  Shortness of breath and weakness, lung cancer. Fall last week, right rib pain, initial encounter. EXAM: CHEST  2 VIEW COMPARISON:  CT chest 04/10/2016 and chest radiograph 03/29/2016. FINDINGS: Trachea is midline. Left IJ power port tip projects at the SVC RA junction. Heart size stable. Irregular nodular lesion in the right suprahilar region is new. Left suprahilar irregular nodular consolidation is grossly unchanged. Mild right basilar volume loss. Bullous changes are seen anteriorly. No definite pleural fluid. IMPRESSION: 1. New irregular nodular opacification in the right suprahilar region may be infectious or inflammatory in etiology. Metastatic disease is not excluded. CT chest with contrast may be helpful in further evaluation, as clinically indicated. 2. Irregular left upper lobe nodularity, similar and better evaluated on 04/10/2016. 3. Mild right basilar volume loss may be due to scarring. Difficult to exclude pneumonia. Electronically Signed   By: Lorin Picket M.D.   On: 08/06/2016 13:11      Scheduled Meds: . amLODipine  10 mg Oral Daily  . diltiazem  180 mg Oral Daily  . feeding supplement (ENSURE ENLIVE)  237 mL Oral BID BM  . gabapentin  100 mg Oral TID  . latanoprost  1 drop Left Eye QHS  . magnesium oxide  400 mg Oral Daily  . methylPREDNISolone (SOLU-MEDROL) injection  40 mg Intravenous Q12H  . mirtazapine  15 mg Oral QHS  .  mometasone-formoterol  2 puff Inhalation BID  . OLANZapine  5 mg Oral QHS  . pantoprazole  40 mg Oral Daily  . rivaroxaban  20 mg Oral Q supper   Continuous Infusions:   LOS: 0 days    Time spent in minutes: 88    [place], MD [facility] Pager: [email or web address] Password [email or web address] 08/07/2016, 2:09 PM

## 2016-08-07 NOTE — Progress Notes (Signed)
Patient ambulated 50 ft in the hallway on 2L O2.   O2 Sats dropped to 76.  Heart rate dropped to 48.  Patient stated he was very weak and short of breath.  A Dr  In the hall grabbed a chair and helped this nurse wheel him back to his room. Patient was encouraged to take deep breaths through his nose and out his mouth. Patient is sitting on the edge of the bed. States he is more comfortable now.  O2 Sats have come back to 100% and HR is 76.

## 2016-08-07 NOTE — Progress Notes (Signed)
Zachary Cooper   DOB:05-Sep-1950   [id number]    This patient is well-known to me.  I was notified that the patient was admitted to the hospital.  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 No lymphadenopathy in the neck. A CT of the chest on 08/23/13 showed bullous emphysema and 2 nodules possibly representing metastatic disease including a 1.4cm left upper lobe nodule and a 1.4cm nodule in the right middle lobe.              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 Bilateral hypermetabolic pulmonary nodules are concerning for bilateral lung metastases.      09/14/2013 - 11/09/2013 Chemotherapy    The patient had weekly cisplatin x 3 with radiation at [facility]. Chemo was stopped due to intolerable toxicities       09/14/2013 - 10/12/2013 Radiation Therapy    He received radiation therapy at [facility]. IMRT was used to treat the primary tumor and regional lymphatics to 4600cGy in 200cGy daily fractions over 23 days followed by a boost of 2400cGy in 12 days to a total dose of 7000cGy.        09/22/2013 Procedure    He has placement of port       09/29/2013 Procedure    He has placement of feeding tube      10/12/2013 Procedure    He has removal of port      10/17/2013 Imaging    Ct neck showed thrombus is present in the jugular vein from the skull base to the brachiocephalic vein. A small venous branch or collateral also demonstrates thrombus in the anterior right neck. The SVC is patent. The right-sided laryngeal mass is smaller with decreased but persistent right to left mass effect on  the airway. Severe atherosclerotic changes at the bifurcation of the carotid arteries on the left, greater than right. In the dermis of the upper anterior right chest wall there is a small fluid and air collection measuring 2.3 x 1.1 cm.      10/17/2013 Imaging    Ct chest showed The SVC is patent however there is thrombus extending throughout the jugular vein to just below the brachiocephalic vein. There is also likely thrombus and a small peripheral vein in the anterior right neck. Several pulmonary nodules, one larger nodule appears smaller with another larger nodule stable and several new small pulmonary nodules. Follow-up recommended. New mild right lower lobe airspace disease with several nodular opacities. This may represent infection. No pleural effusion.      01/01/2014 Imaging    Ct chest showed Interval improvement in heterogeneous right lower lobe opacity, likely improving infection/aspiration. Previously visualized FDG avid left upper lobe nodule is less solid compared to most recent prior but not significantly changed in size. Previously visualized FDG avid right middle lobe nodule has increased in size compared to most recent prior, likely disease progression. Significant interval increase in size of left upper lobe nodules consistent with progressive metastatic disease. Other smaller previously visualized nodules are not visualized and were likely inflammatory/infectious in etiology.  01/01/2014 Imaging    Ct neck showed thrombus present in the jugular vein from skull base to brachiocephalic vein, the SVC remains patent. Extensive atherosclerotic calcifications of the carotid bifurcations left greater than right the mass in the hypopharynx. Improved appearance of the hypopharyngeal mass with less edema and last less shift to the left. Less edema and fluid within the adjacent soft tissues also demonstrated. Increased fluid in the retropharyngeal prevertebral soft tissues.       04/15/2014 Imaging    CT pulmonary arteriogram demonstrates no evidence of pulmonary embolism. Bilateral pulmonary lesions demonstrated enlarged since the prior study. A new lesion is also seen within the lingula. Findings would be typical of pulmonary metastatic lesions. Focal parenchymal infiltrate demonstrated in the right lower lobe. Left basilar atelectasis. Middle lobe infiltrate or atelectasis demonstrated with underlying bronchiectasis. Lingular infiltrate or atelectasis also      05/03/2014 Imaging    Ct chest showed interval enlargement of at least 2 pulmonary nodules as described above. Findings are suspicious for worsening disease.  Interval development of consolidation within the lateral segment right middle lobe could be related to a filling defect in a branch of the lateral segment right middle lobe bronchus. The consolidation favored to represent atelectasis. The endobronchial focus could represent mucoid impactionor possibly a fixed endobronchial lesion. Aspiration would be less likely given its location in the right middle lobe.      05/03/2014 Imaging    Ct neck showed no significant change in right hypopharyngeal mass. Stable airway asymmetry. Ill-defined soft tissue along the carotid space carotid encasement mildly improved. Multispatial soft tissue stranding/fluid has improved. Right internal jugular vein not visualized..      05/13/2014 Procedure    Tracheostomy was removed         06/16/2014 - 03/16/2015 Chemotherapy    The patient had carboplatin AUC 2 and Taxol 45 mg/m2 at [facility]       06/16/2014 Imaging    Ct chest showed stable dominant nodules from recent prior. Presumably, metastatic disease. New increased groundglass attenuation opacities, right lower lobe, centrilobular and clumped nodules, as well as a more dominant nodular opacity. While these findings could be secondary to progressive metastatic disease, given their location and appearance, aspiration  pneumonia/pneumonitis is also possible.      06/21/2014 Imaging    Ct abdomen showed gastrostomy tube is positioned within the stomach. No evidence for an acute inflammatory process within the abdomen or pelvis. No large or small bowel obstruction. No change right lower lobe airspace disease and nodular opacities      09/30/2014 Imaging    Ct chest showed interval decrease in size of metastatic pulmonary nodules. One of these nodules demonstrates new cavitation within the right upper lobe. Interval improvement of nodular opacities within the right lower lobe suggestive of a resolving aspiration.      04/01/2015 Imaging    Ct chest showed small amount of right upper lobe acute, nonocclusive pulmonary embolus. Enlarged left upper lobe pulmonary metastasis. Mixed response with resolved previous smaller nodule in the lingula. Right middle and right lower lobe appearance likely represents posttreatment cicatrization atelectasis (query if radiation therapy has been performed in this region). Worsened right infrahilar bronchial wall thickening with occlusion of a subsegmental branch. No evidence for postobstructive pneumonia. Paraseptal emphysema.      04/19/2015 - 02/29/2016 Chemotherapy    The patient had Keytruda at [facility].  He subsequently relocated to [facility]       04/29/2015 Imaging    Ct  neck showed interval significant reduction in size of previous large right supraglottic mass. Airway is now widely patent at this level. No definite discrete supraglottic or pharyngeal mass demonstrated currently by CT. Left upper lobe spiculated mass. Prominent calcified atherosclerotic plaquing involving the carotid bifurcations and carotid bulbs greater on the left resulting in stenosis of the internal carotid artery origins. Consider follow-up carotid Doppler ultrasound exam for further evaluation.      07/13/2015 Imaging    Ct neck showed expected post-treatment changes in the neck without evidence of  recurrent disease in the primary site. No abnormal lymph nodes. Significant biapical bullous disease /paraseptal emphysema in the lungs, unchanged from prior. Previously seen left upper lobe nodular mass is visualized on the same-day CT chest.       07/13/2015 Imaging    Ct chest showed interval decrease in size of a spiculated left upper lobe nodule. Numerous additional nonspecific pulmonary nodules of the bilateral lungs, which are stable compared to prior examination. Clustered tree-in-bud nodules of the right lung base are likely sequelae of Aspiration. Bronchiectasis and atelectasis of the right middle lobe. Emphysema.      08/24/2015 Miscellaneous    He received IV feraheme      09/20/2015 Imaging    CT scan showed acute, comminuted, medial right clavicular fracture and marked, overlying soft tissue swelling. Mild, acute, T10 compression fracture.  The known left upper lobe carcinoma has modestly decreased in size since the 04/01/2015 comparison. Perhaps there has been a response to interim therapy. Right middle lobe and lower lobe bronchiectasis and pleuroparenchymal scarring were also present on the 04/01/2015 comparison. Confluent consolidation, however, is less striking than on the prior. Perhaps this is residua of prior pneumonia or radiation. Marked bullous emphysema is unchanged. Advanced, 3-vessel coronary artery disease. The liver is unusual in shape, but the findings are not definitive for cirrhosis. The appearance is unchanged since 08/19/2014.      09/20/2015 Imaging    Ct head showed mild patchy low-density present in the periventricular white matter. No mass, hemorrhage, or extracerebral fluid.  Mild supratentorial ventricle and sulcal prominence within normal limits.  A component of diffuse cerebellar atrophy is present. Carotid siphon and distal left vertebral vascular calcifications are present. On bone windows, no calvarial lesion. Normal mastoid aeration. Mucosal thickening left  posterior ethmoid air cells and retention cyst left maxillary sinus.      10/05/2015 Imaging    CT chest, abdomen and pelvis showed increasing size of spiculated nodule in the left upper lobe. Increasing adjacent groundglass opacities are concerning for lymphangitic spread of tumor. Additional pulmonary nodules are unchanged. Multiple mildly enlarged mesenteric lymph nodes, similar to prior. Sclerotic changes and height loss of the T10 vertebral body, new from prior. This is concerning for osseous metastatic disease. Extensive atherosclerotic disease at the origin of the celiac and throughout the proximal superior mesenteric artery.      10/05/2015 Imaging    Ct neck showed decreased supraglottic edema, with residual asymmetric fullness right greater than left. Stable post-therapeutic retropharyngeal edema. Comminuted fracture of the proximal right clavicle/clavicular head with surrounding soft tissue and pectoral stranding, new in the interval. Findings could be posttraumatic and/or pathologic.      11/16/2015 Imaging    Ct chest showed stable dominant left upper lobe nodule, and surrounding opacities which may be related to radiation. New 10-14-mm left upper lobe nodule, anterior to dominant nodule and radiation change. While this could also represent radiation change, infection or neoplasm are in  the differential diagnosis      12/27/2015 Imaging    Ct chest showed stable to slight decrease in nodular consolidative opacities, which are likely related to prior therapy. No new abnormalities      01/31/2016 Imaging    CT brain at [facility] showed no metastatic disease. No acute intracranial process.      03/22/2016 -  Chemotherapy    He receives Keytruda       04/11/2016 Imaging    Ct chest showed 15 x 10 mm left upper lobe nodule, mildly decreased and likely reflecting improving metastasis, with surrounding radiation changes. Additional multifocal patchy opacities in the left lower lobe, favored  to reflect radiation changes, less likely infection.      04/11/2016 Imaging    Ct neck showed regressed diffuse pharyngeal mucosal space soft tissue thickening since April 2017, likely sequelae of radiation. No residual pharyngeal mass or lymphadenopathy identified. Pathologic appearing fracture of the medial right clavicle redemonstrated, with some interval periosteal new bone formation but no solid osseous union. No osseous metastatic disease identified in the neck.       Subjective: He felt a little stronger since admission.  Shortness of breath has improved.  His vitals are stable this morning.  He denies cough.  Objective:  Vitals:   08/06/16 2118 08/07/16 0615  BP: 101/68 115/77  Pulse: 78 83  Resp: 16 16  Temp: 98 F (36.7 C) 98.1 F (36.7 C)     Intake/Output Summary (Last 24 hours) at 08/07/16 0800 Last data filed at 08/07/16 0120  Gross per 24 hour  Intake              530 ml  Output              800 ml  Net             -270 ml    GENERAL:alert, no distress and comfortable SKIN: skin color, texture, turgor are normal, no rashes or significant lesions EYES: normal, Conjunctiva are pink and non-injected, sclera clear OROPHARYNX:no exudate, no erythema and lips, buccal mucosa, and tongue normal  NECK: supple, thyroid normal size, non-tender, without nodularity LYMPH:  no palpable lymphadenopathy in the cervical, axillary or inguinal LUNGS: Reduced breath sounds throughout.  Normal breathing effort  HEART: regular rate & rhythm and no murmurs and no lower extremity edema ABDOMEN:abdomen soft, non-tender and normal bowel sounds Musculoskeletal:no cyanosis of digits and no clubbing  NEURO: alert & oriented x 3 with fluent speech, no focal motor/sensory deficits   Labs:  Lab Results  Component Value Date   WBC 4.9 08/07/2016   HGB 9.3 (L) 08/07/2016   HCT 29.6 (L) 08/07/2016   MCV 82.0 08/07/2016   PLT 211 08/07/2016   NEUTROABS 5.0 08/02/2016    Lab Results   Component Value Date   NA 139 08/07/2016   K 3.6 08/07/2016   CL 103 08/07/2016   CO2 29 08/07/2016    Studies:  Dg Chest 2 View  Result Date: 08/06/2016 CLINICAL DATA:  Shortness of breath and weakness, lung cancer. Fall last week, right rib pain, initial encounter. EXAM: CHEST  2 VIEW COMPARISON:  CT chest 04/10/2016 and chest radiograph 03/29/2016. FINDINGS: Trachea is midline. Left IJ power port tip projects at the SVC RA junction. Heart size stable. Irregular nodular lesion in the right suprahilar region is new. Left suprahilar irregular nodular consolidation is grossly unchanged. Mild right basilar volume loss. Bullous changes are seen anteriorly. No definite pleural fluid.  IMPRESSION: 1. New irregular nodular opacification in the right suprahilar region may be infectious or inflammatory in etiology. Metastatic disease is not excluded. CT chest with contrast may be helpful in further evaluation, as clinically indicated. 2. Irregular left upper lobe nodularity, similar and better evaluated on 04/10/2016. 3. Mild right basilar volume loss may be due to scarring. Difficult to exclude pneumonia. Electronically Signed   By: Lorin Picket M.D.   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 His most recent CT scan in October 2017 showed a positive response to treatment  I have ordered outpatient imaging study for next week.  If he gets discharged soon, we can defer imaging study until outpatient  Cancer associated pain He has poorly controlled pain due to recent falls. He has oxycodone 15 mg to take as needed for pain He is not taking his medicine consistently due to intermittent confusion at home  Intermittent confusion at home Cause is unknown but likely due to hypoxemia due to inconsistent oxygen  intake.  Metastasis to lung [facility]) & COPD He has mild COPD This is actually improving on prednisone Continue the same  Protein-calorie malnutrition, moderate ([place]) He has profound weight loss since he was last seen here. He is not taking his medication consistently. Previously, he has gained a lot of weight on prednisone but because he forgot to take his medications recently, his appetite has come down. I discussed importance of frequent small meals and taking his medications correctly. Recommend dietitian to see him while he is hospitalized.    Pulmonary embolism ([place]) He had history of DVT and PE. He is on chronic anticoagulation therapy. Despite mild iron deficiency anemia, I felt that the benefit of chronic anticoagulation therapy outweigh the risks.  Physical deconditioning He has significant physical deconditioning. I have set up home physical therapy recently. Recommend PT consult while hospitalized  Iron deficiency anemia The patient is noted to have iron deficiency anemia, could be due to minor GI bleed from chronic anticoagulation therapy. He has recently received intravenous iron.  Discharge planning He has poor home circumstances.  Most importantly, we need to get PT on board to make sure he is safe to return back to home.  Previously, I have set up home PT and home health nurse to check on him.  I would defer to primary service for discharge planning.  Will follow   Heath Lark, MD 08/07/2016  8:00 AM

## 2016-08-07 NOTE — Progress Notes (Signed)
Rapid Response Event Note  Overview: Called back to 1324; patient SOB, chest/abdomal pain; wheezing and restless. Placed patient back on our monitor, called RT for nebulizer treatment and RN had already notified MD of change of status.      Initial Focused Assessment: Patient was in moderate distress and restless, stated that he was in pain and he was scared. NT had gotten patient to the side of the bed to urinate and patient became short of breath and began to wheeze. Once on RRT monitor patient HR was in the 80's-90's, oxygen saturation was 100% on 2 liters, RR was in 25-30. Had to turn oxygen up to 4L for a brief period of time. Pain medication was given for pain 10 out of 10. HR and RR rate came down and patient stabilized.    Interventions: MD orders received  Plan of Care (if not transferred): CT scan of the chest and transfer to telemetry  Event Summary: Stayed with patient from 1400-1500; kept patient on the monitor until he was ready to transfer to telemetry. Patient was calm and resting        Poseidon Pam B

## 2016-08-07 NOTE — Progress Notes (Signed)
Initial Nutrition Assessment  DOCUMENTATION CODES:   Non-severe (moderate) malnutrition in context of chronic illness  INTERVENTION:   Provide Ensure Enlive po BID, each supplement provides 350 kcal and 20 grams of protein Encourage PO intake RD to continue to monitor  NUTRITION DIAGNOSIS:   Malnutrition related to chronic illness as evidenced by percent weight loss, moderate depletions of muscle mass.  GOAL:   Patient will meet greater than or equal to 90% of their needs  MONITOR:   PO intake, Supplement acceptance, Labs, Weight trends, I & O's  REASON FOR ASSESSMENT:   Consult Assessment of nutrition requirement/status  ASSESSMENT:   66 y.o. 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 In emergency room, patient was quite dyspneic although not wheezing. Vision received 2 breathing treatments and by time that he arrives in emergency him, his breathing had improved.  Patient reports good appetite recently. PO intakes have ranged from 75-100% since admission. Pt denies swallowing, chewing or taste issues. Pt states he drinks protein supplements at home, will order Ensure supplements to supplement diet.   Per chart review, pt has lost 21 lb since October 2017 (12% wt loss x 4 months, significant for time frame). Nutrition-Focused physical exam completed. Findings are no fat depletion, moderate muscle depletion, and no edema.   Labs reviewed. Medications: MAG-OX tablet daily, Remeron tablet daily, Protonix tablet daily  Diet Order:  Diet Heart Room service appropriate? Yes; Fluid consistency: Thin  Skin:  Reviewed, no issues  Last BM:  2/24  Height:   Ht Readings from Last 1 Encounters:   08/06/16 6\' 1"  (1.854 m)    Weight:   Wt Readings from Last 1 Encounters:  08/06/16 158 lb 8.2 oz (71.9 kg)    Ideal Body Weight:  82.5 kg  BMI:  Body mass index is 20.91 kg/m.  Estimated Nutritional Needs:   Kcal:  2000-2200  Protein:  100-110g  Fluid:  2L/day  EDUCATION NEEDS:   No education needs identified at this time  Clayton Bibles, MS, RD, LDN Pager: [phone] After Hours Pager: [phone]

## 2016-08-07 NOTE — Progress Notes (Signed)
Rapid Response Event Note  Overview: RRT nurse called to Rm 1324; RN stated that patient was short of breath, wheezing, had an erratic heart rate. Had the nurse call MD to give report of patient's current condition and call RT to give PRN nebulizer treatment.  Arrived in the room and the patient appeared to be in mild distress. Hooked up to our monitor.     Initial Focused Assessment:n Patient was found to be wheezing with some shortness of breath. He was on 2L [place] and his O2 saturation 100% and RT arrived in the room to give neb treatment. All vitals are charted and notes made in Flowsheets. Patient complained of chest pain for a brief period of time, but pain eased and he was able to fall asleep.  MD came to room and was satisfied that patient was stable.  Interventions: No orders received  Plan of Care (if not transferred): continue to monitor and call RRT if needed  Event Summary: Patient was stable, HR in 70's-80's, oxygen saturation 100%, RR 17-20 and no more complaints of pain. Gave report to RN and spoke with AD.        Bellami Farrelly B

## 2016-08-07 NOTE — Progress Notes (Signed)
PROGRESS NOTE    Zachary Cooper   [id number]  DOB: September 29, 1950  DOA: 08/06/2016 PCP: No PCP Per Patient   Brief Narrative:  Zachary Cooper is a 66 y.o. 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 No recent cough or fever. In emergency room, patient was quite dyspneic although not wheezing. He received 2 breathing treatments and his breathing had improved.  Subjective: Ambulated this AM without O2 and became very short of breath. Improved with rest and a neb treatment.  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 Tachycardia/ A-fib - per note from Dr Melvyn Novas, 10/17, he has a h/o A-fib with RVR - question of A-fib while on monitor temporarily today - HR jumping to 150s with mild exertion- may be having A-fib with RVR - transfer to telemetry - on Xarelto as he has a concurrent h/o DVT and PE  H/o DVT / PE - Xarelto per Dr Alvy Bimler    Anemia of chronic disease Hb is stable    Protein-calorie malnutrition, moderate and weight loss - nutrition consulted  Deconditioning - Dr Alvy Bimler has set up HHPT recently  Iron deficiency anemia He has recently received intravenous iron.  DVT prophylaxis: Xarelto Code Status: FDNR Family  Communication:  Disposition Plan: home when stable Consultants:   oncology Procedures:    Antimicrobials:  Anti-infectives    Start     Dose/Rate Route Frequency Ordered Stop   08/06/16 1415  cefTRIAXone (ROCEPHIN) 1 g in dextrose 5 % 50 mL IVPB     1 g 100 mL/hr over 30 Minutes Intravenous  Once 08/06/16 1408 08/06/16 1532   08/06/16 1415  azithromycin (ZITHROMAX) 500 mg in dextrose 5 % 250 mL IVPB     500 mg 250 mL/hr over 60 Minutes Intravenous  Once 08/06/16 1408 08/06/16 1635       Objective: Vitals:   08/07/16 1430 08/07/16 1438 08/07/16 1445 08/07/16 1522  BP: 137/81 114/79  116/77  Pulse: (!) 103 (!) 107  94  Resp: (!) 33 (!) 29  20  Temp:    98 F (36.7 C)  TempSrc:    Oral  SpO2: 96% 95% (!) 85% 100%  Weight:      Height:        Intake/Output Summary (Last 24 hours) at 08/07/16 1646 Last data filed at 08/07/16 0917  Gross per 24 hour  Intake              720 ml  Output              800 ml  Net              -80 ml   Filed Weights   08/06/16 1621  Weight: 71.9 kg (  158 lb 8.2 oz)    Examination: General exam: Appears comfortable  HEENT: PERRLA, oral mucosa moist, no sclera icterus or thrush Respiratory system: Clear to auscultation. Respiratory effort normal. Cardiovascular system: S1 & S2 heard, RRR.  No murmurs  Gastrointestinal system: Abdomen soft, non-tender, nondistended. Normal bowel sound. No organomegaly Central nervous system: Alert and oriented. No focal neurological deficits. Extremities: No cyanosis, clubbing or edema Skin: No rashes or ulcers Psychiatry:  Mood & affect appropriate.     Data Reviewed: I have personally reviewed following labs and imaging studies  CBC:  Recent Labs Lab 08/02/16 1216 08/06/16 1226 08/07/16 0532  WBC 6.3 6.8 4.9  NEUTROABS 5.0  --   --   HGB 10.9* 10.0* 9.3*  HCT 34.8* 32.1* 29.6*  MCV 81.7 81.9 82.0  PLT 235 238 [id number]   Basic Metabolic Panel:  Recent Labs Lab 08/02/16 1216 08/06/16 1226  08/07/16 0532  NA 137 137 139  K 3.4* 3.6 3.6  CL  --  101 103  CO2 27 29 29   GLUCOSE 97 117* 110*  BUN 25.9 9 12   CREATININE 0.9 0.95 0.90  CALCIUM 9.3 9.5 9.4   GFR: Estimated Creatinine Clearance: 83.2 mL/min (by C-G formula based on SCr of 0.9 mg/dL). Liver Function Tests:  Recent Labs Lab 08/02/16 1216  AST 45*  ALT 25  ALKPHOS 104  BILITOT 1.24*  PROT 7.6  ALBUMIN 2.8*   No results for input(s): LIPASE, AMYLASE in the last 168 hours. No results for input(s): AMMONIA in the last 168 hours. Coagulation Profile: No results for input(s): INR, PROTIME in the last 168 hours. Cardiac Enzymes: No results for input(s): CKTOTAL, CKMB, CKMBINDEX, TROPONINI in the last 168 hours. BNP (last 3 results) No results for input(s): PROBNP in the last 8760 hours. HbA1C: No results for input(s): HGBA1C in the last 72 hours. CBG: No results for input(s): GLUCAP in the last 168 hours. Lipid Profile: No results for input(s): CHOL, HDL, LDLCALC, TRIG, CHOLHDL, LDLDIRECT in the last 72 hours. Thyroid Function Tests: No results for input(s): TSH, T4TOTAL, FREET4, T3FREE, THYROIDAB in the last 72 hours. Anemia Panel: No results for input(s): VITAMINB12, FOLATE, FERRITIN, TIBC, IRON, RETICCTPCT in the last 72 hours. Urine analysis: No results found for: COLORURINE, APPEARANCEUR, LABSPEC, PHURINE, GLUCOSEU, HGBUR, BILIRUBINUR, KETONESUR, PROTEINUR, UROBILINOGEN, NITRITE, LEUKOCYTESUR Sepsis Labs: @LABRCNTIP (procalcitonin:4,lacticidven:4) ) Recent Results (from the past 240 hour(s))  Culture, blood (routine x 2) Call MD if unable to obtain prior to antibiotics being given     Status: None (Preliminary result)   Collection Time: 08/06/16  5:34 PM  Result Value Ref Range Status   Specimen Description BLOOD LEFT HAND  Final   Special Requests IN PEDIATRIC BOTTLE 2CC  Final   Culture   Final    NO GROWTH < 12 HOURS Performed at [facility], [place] [address], [place], [place]  [address]    Report Status PENDING  Incomplete  Culture, blood (routine x 2) Call MD if unable to obtain prior to antibiotics being given     Status: None (Preliminary result)   Collection Time: 08/06/16  5:40 PM  Result Value Ref Range Status   Specimen Description BLOOD LEFT HAND  Final   Special Requests IN PEDIATRIC BOTTLE 3 CC  Final   Culture   Final    NO GROWTH < 12 HOURS Performed at [facility], [place] [address] [place], [place] [address]    Report Status PENDING  Incomplete  Radiology Studies: Dg Chest 2 View  Result Date: 08/06/2016 CLINICAL DATA:  Shortness of breath and weakness, lung cancer. Fall last week, right rib pain, initial encounter. EXAM: CHEST  2 VIEW COMPARISON:  CT chest 04/10/2016 and chest radiograph 03/29/2016. FINDINGS: Trachea is midline. Left IJ power port tip projects at the SVC RA junction. Heart size stable. Irregular nodular lesion in the right suprahilar region is new. Left suprahilar irregular nodular consolidation is grossly unchanged. Mild right basilar volume loss. Bullous changes are seen anteriorly. No definite pleural fluid. IMPRESSION: 1. New irregular nodular opacification in the right suprahilar region may be infectious or inflammatory in etiology. Metastatic disease is not excluded. CT chest with contrast may be helpful in further evaluation, as clinically indicated. 2. Irregular left upper lobe nodularity, similar and better evaluated on 04/10/2016. 3. Mild right basilar volume loss may be due to scarring. Difficult to exclude pneumonia. Electronically Signed   By: Lorin Picket M.D.   On: 08/06/2016 13:11      Scheduled Meds: . iopamidol      . albuterol      . amLODipine  10 mg Oral Daily  . diltiazem  180 mg Oral Daily  . feeding supplement (ENSURE ENLIVE)  237 mL Oral BID BM  . gabapentin  100 mg Oral TID  . latanoprost  1 drop Left Eye QHS  . magnesium oxide  400 mg Oral Daily  . methylPREDNISolone (SOLU-MEDROL)  injection  40 mg Intravenous Q12H  . mirtazapine  15 mg Oral QHS  . mometasone-formoterol  2 puff Inhalation BID  . OLANZapine  5 mg Oral QHS  . pantoprazole  40 mg Oral Daily  . rivaroxaban  20 mg Oral Q supper   Continuous Infusions:   LOS: 0 days    Time spent in minutes: 56    [place]arl [place], MD [facility] Pager: [email or web address] Password [facility] 08/07/2016, 4:46 PM

## 2016-08-08 DIAGNOSIS — D638 Anemia in other chronic diseases classified elsewhere: Secondary | ICD-10-CM

## 2016-08-08 DIAGNOSIS — J9601 Acute respiratory failure with hypoxia: Secondary | ICD-10-CM

## 2016-08-08 DIAGNOSIS — J189 Pneumonia, unspecified organism: Secondary | ICD-10-CM

## 2016-08-08 DIAGNOSIS — R918 Other nonspecific abnormal finding of lung field: Secondary | ICD-10-CM

## 2016-08-08 DIAGNOSIS — Z515 Encounter for palliative care: Secondary | ICD-10-CM

## 2016-08-08 LAB — HIV ANTIBODY (ROUTINE TESTING W REFLEX): HIV SCREEN 4TH GENERATION: NONREACTIVE

## 2016-08-08 LAB — PROCALCITONIN: Procalcitonin: <0.1 ng/mL

## 2016-08-08 MED ORDER — LORAZEPAM 0.5 MG PO TABS
0.5000 mg | ORAL_TABLET | Freq: Three times a day (TID) | ORAL | Status: DC | PRN
  Administered 2016-08-08: 0.5 mg via ORAL
  Filled 2016-08-08: qty 1

## 2016-08-08 MED ORDER — SENNA 8.6 MG PO TABS
1.0000 | ORAL_TABLET | Freq: Every day | ORAL | Status: DC
  Filled 2016-08-08: qty 1

## 2016-08-08 MED ORDER — SODIUM CHLORIDE 0.9% FLUSH
10.0000 mL | INTRAVENOUS | Status: DC | PRN
  Administered 2016-08-08: 10 mL

## 2016-08-08 MED ORDER — MORPHINE SULFATE (CONCENTRATE) 10 MG/0.5ML PO SOLN
5.0000 mg | ORAL | Status: DC | PRN
  Administered 2016-08-08 (×2): 5 mg via ORAL
  Administered 2016-08-09 (×4): 10 mg via ORAL
  Filled 2016-08-08 (×6): qty 0.5

## 2016-08-08 MED ORDER — DILTIAZEM HCL ER COATED BEADS 240 MG PO CP24
240.0000 mg | ORAL_CAPSULE | Freq: Every day | ORAL | Status: DC
  Administered 2016-08-08 – 2016-08-09 (×2): 240 mg via ORAL
  Filled 2016-08-08 (×2): qty 1

## 2016-08-08 MED ORDER — DILTIAZEM HCL ER COATED BEADS 240 MG PO CP24: 240.0000 mg | ORAL_CAPSULE | Freq: Every day | ORAL | Status: DC

## 2016-08-08 MED ORDER — HYDROCOD POLST-CPM POLST ER 10-8 MG/5ML PO SUER
5.0000 mL | Freq: Two times a day (BID) | ORAL | Status: DC | PRN
  Administered 2016-08-09: 5 mL via ORAL
  Filled 2016-08-08: qty 5

## 2016-08-08 NOTE — Progress Notes (Signed)
Zachary Cooper   DOB:[birth year]   [id number]    Subjective: The patient is awake.  he felt that his breathing is better.  He continues to feel weak.  Denies hemoptysis  Objective:  Vitals:   08/07/16 2049 08/08/16 0600  BP: 108/74 123/79  Pulse: 75 70  Resp: 20 18  Temp: 98.8 F (37.1 C) 98.4 F (36.9 C)     Intake/Output Summary (Last 24 hours) at 08/08/16 0950 Last data filed at 08/08/16 0600  Gross per 24 hour  Intake             1440 ml  Output              550 ml  Net              890 ml    GENERAL:alert, no distress and comfortable SKIN: skin color, texture, turgor are normal, no rashes or significant lesions EYES: normal, Conjunctiva are pink and non-injected, sclera clear OROPHARYNX:no exudate, no erythema and lips, buccal mucosa, and tongue normal  NECK: supple, thyroid normal size, non-tender, without nodularity LYMPH:  no palpable lymphadenopathy in the cervical, axillary or inguinal LUNGS: clear to auscultation and percussion with normal breathing effort HEART: Irregular rate and rhythm, no murmurs and no lower extremity edema ABDOMEN:abdomen soft, non-tender and normal bowel sounds Musculoskeletal:no cyanosis of digits and no clubbing  NEURO: alert & oriented x 3 with fluent speech, no focal motor/sensory deficits   Labs:  Lab Results  Component Value Date   WBC 4.9 08/07/2016   HGB 9.3 (L) 08/07/2016   HCT 29.6 (L) 08/07/2016   MCV 82.0 08/07/2016   PLT 211 08/07/2016   NEUTROABS 5.0 08/02/2016    Lab Results  Component Value Date   NA 139 08/07/2016   K 3.6 08/07/2016   CL 103 08/07/2016   CO2 29 08/07/2016    Studies:  Dg Chest 2 View  Result Date: 08/06/2016 CLINICAL DATA:  Shortness of breath and weakness, lung cancer. Fall last week, right rib pain, initial encounter. EXAM: CHEST  2 VIEW COMPARISON:  CT chest 04/10/2016 and chest radiograph 03/29/2016. FINDINGS: Trachea is midline. Left IJ power port tip projects at the SVC RA junction.  Heart size stable. Irregular nodular lesion in the right suprahilar region is new. Left suprahilar irregular nodular consolidation is grossly unchanged. Mild right basilar volume loss. Bullous changes are seen anteriorly. No definite pleural fluid. IMPRESSION: 1. New irregular nodular opacification in the right suprahilar region may be infectious or inflammatory in etiology. Metastatic disease is not excluded. CT chest with contrast may be helpful in further evaluation, as clinically indicated. 2. Irregular left upper lobe nodularity, similar and better evaluated on 04/10/2016. 3. Mild right basilar volume loss may be due to scarring. Difficult to exclude pneumonia. Electronically Signed   By: Lorin Picket M.D.   On: 08/06/2016 13:11   Ct Chest W Contrast  Result Date: 08/07/2016 CLINICAL DATA:  Worsening shortness of breath, hypopharyngeal carcinoma with lung metastases EXAM: CT CHEST WITH CONTRAST TECHNIQUE: Multidetector CT imaging of the chest was performed during intravenous contrast administration. CONTRAST:  75 mL Isovue-300 COMPARISON:  CT chest dated 04/10/2016 FINDINGS: Cardiovascular: The heart is normal in size. No pericardial effusion. Three vessel coronary atherosclerosis. Atherosclerotic calcifications of the aortic arch. Left chest port terminating at the cavoatrial junction. Mediastinum/Nodes: No suspicious mediastinal lymphadenopathy. Visualized thyroid is unremarkable. Lungs/Pleura: 9 x 15 mm left upper lobe pulmonary nodule (series 7/ image 62), previously 10 x 15 mm,  compatible with stable metastasis. Surrounding left upper lobe radiation changes. 2.4 x 1.4 cm spiculated right upper lobe opacity (series 7/ image 44). Additional 1.5 cm right upper lobe spiculated opacity (series 7/image 44). These are new from the prior, favoring infection, with tumor considered less likely. Additional multifocal nodular/ spiculated opacities in the right middle lobe and bilateral lower lobes, including  two dominant irregular nodules in the anterior left upper lobe measuring up to 1.5 cm (series 7/image 96). This appearance is new/progressive from the prior, favoring multifocal infection. Bronchiectasis with atelectasis/ collapse in the right middle lobe, chronic. Underlying moderate paraseptal emphysematous changes, upper lobe predominant. No pleural effusion or pneumothorax. Upper Abdomen: Visualized upper abdomen is notable for a mildly nodular hepatic contour and vascular calcifications. Musculoskeletal: Mild superior endplate compression fracture deformity at T7, new. Mild inferior endplate compression fracture deformity at T9, unchanged. No retropulsion. IMPRESSION: Stable 15 mm left upper lobe pulmonary metastasis. Surrounding radiation changes. Two spiculated right upper lobe opacities measuring up to 2.4 cm, favoring infection, tumor less likely. Additional multifocal irregular nodular opacities in the right middle lobe and bilateral lower lobes, new/ progressive from the prior, favoring multifocal infection/pneumonia. Mild superior endplate compression fracture deformity at T7, new. Stable mild inferior endplate compression fracture deformity at T9. No retropulsion. Electronically Signed   By: Julian Hy M.D.   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 His most recent CT scan in October 2017 showed a positive response to treatment  Repeat CT scan is reviewed by myself.  I felt that the abnormal changes seen in his lungs are more compatible with metastatic cancer.  Another possibility would be pneumonitis from his immunotherapy although the pattern suggests more like malignancy. I spoke with the hospitalist and agree with pulmonary evaluation. If this is proven to be progression of disease, I do not  believe the patient is a candidate for more future chemotherapy. I felt that palliative care consult now is appropriate  Cancer associated pain He has poorly controlled pain due to recent falls. He has oxycodone 15 mg to take as needed for pain He is not taking his medicine consistently due to intermittent confusion at home He denies pain today  Intermittent confusion at home Cause is unknown but likely due to hypoxemia due to inconsistent oxygen intake. His mental status has improved since yesterday  Metastasis to lung [facility]) & COPD He has mild COPD This is actually improving on prednisone Continue the same The pattern seen on CT is less likely to be infectious based on my interpretation.  Pro-calcitonin is negative  Protein-calorie malnutrition, moderate (HCC) He has profound weight loss since he was last seen here. He is not taking his medication consistently. Previously, he has gained a lot of weight on prednisone but because he forgot to take his medications recently, his appetite has come down. I discussed importance of frequent small meals and taking his medications correctly. Recommend dietitian to see him while he is hospitalized.    Pulmonary embolism ([place]) He had history of DVT and PE. He is on chronic anticoagulation therapy. Despite mild iron deficiency anemia, I felt that the benefit of chronic anticoagulation therapy outweigh the risks.  Physical deconditioning He has significant physical deconditioning. I have set up home physical therapy recently. Recommend PT consult while hospitalized   Iron deficiency  anemia The patient is noted to have iron deficiency anemia, could be due to minor GI bleed from chronic anticoagulation therapy. He has recently received intravenous iron.  Discharge planning He has poor home circumstances.  Most importantly, we need to get PT on board to make sure he is safe to return back to home.  Previously, I have set up home PT  and home health nurse to check on him.  I recommend palliative care consult while the patient is hospitalized for further goals of care discussion  Heath Lark, MD 08/08/2016  9:50 AM

## 2016-08-08 NOTE — Consult Note (Signed)
Name: Zachary Cooper MRN: [id number] DOB: 04/19/1951    ADMISSION DATE:  08/06/2016 CONSULTATION DATE:  08/08/16  REFERRING MD :  Dr. Wynelle Cleveland / [facility]   CHIEF COMPLAINT:  Dyspnea, infiltrates on CT   HISTORY OF PRESENT ILLNESS:  66 y/o M admitted on 2/26 with reports of shortness of breath.  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 Alvy Bimler and [facility]). As of 04/11/2016 imaging showed no residual pharyngeal mass or lymphadenopathy.   Initial work up included a CXR which was worrisome for PNA.  Otherwise, his labs were at baseline.  He was admitted per [facility] for further evaluation. He was empirically treated for PNA with rocephin + azithromycin x1 dose, nebulized bronchodilators and IV solumedrol.  Procalcitonin was negative and WBC remained within normal limits.  A CT of the chest was assessed given concern for PNA which revealed a stable (known) LUL 9x15 mm nodule and new 2.4x1.4 cm spiculated RUL opacity, additional 1.5 cm RUL spiculated opacity, bronchiectasis with atelectasis/collapse in RML, chronic emphysema of upper lobes and new T7 compression fractures.    PCCM consulted for evaluation of dyspnea, abnormal CT chest.  Currently, the patient reports his symptoms began acutely on day of admission.  He denies infectious symptoms to include fever, chills, n/v/d.  He reports at baseline he walks around his home and mostly sits in his chair.  "I am not able to do much".  He states he has lost 5lbs as he fatigues from a respiratory standpoint with eating.  Reports good appetite but cant finish meals due to SOB.  Reports dry cough.   PAST MEDICAL HISTORY :   has a past medical history of Anemia; Blood transfusion without reported diagnosis; Cancer [facility]); CHF (congestive heart failure) ([place]); Chronic kidney disease; Clotting disorder ([place]); COPD (chronic obstructive pulmonary disease)  ([place]); Hypertension; Myocardial infarction; Seizures ([place]); and Stroke ([place]).   has a past surgical history that includes Port-a-cath removal; PICC LINE REMOVAL ([place] [facility]X); Tracheostomy; Gastrostomy w/ feeding tube; ir generic historical (06/13/2016); and ir generic historical (06/13/2016).  Prior to Admission medications   Medication Sig Start Date End Date Taking? Authorizing Provider  albuterol (ACCUNEB) 1.25 MG/3ML nebulizer solution Take 3 mLs (1.25 mg total) by nebulization every 6 (six) hours as needed for wheezing or shortness of breath. 03/22/16  Yes Heath Lark, MD  albuterol (PROVENTIL HFA;VENTOLIN HFA) 108 (90 Base) MCG/ACT inhaler Inhale 2 puffs into the lungs every 4 (four) hours as needed for wheezing or shortness of breath. 03/22/16  Yes Heath Lark, MD  amLODipine (NORVASC) 10 MG tablet Take 10 mg by mouth daily.   Yes Historical Provider, MD  budesonide-formoterol (SYMBICORT) 160-4.5 MCG/ACT inhaler Inhale 2 puffs into the lungs 2 (two) times daily. 03/29/16  Yes Tanda Rockers, MD  diltiazem (CARDIZEM CD) 180 MG 24 hr capsule Take 1 capsule (180 mg total) by mouth daily. 03/22/16 03/22/17 Yes Heath Lark, MD  gabapentin (NEURONTIN) 100 MG capsule Take 1 capsule (100 mg total) by mouth at bedtime. Patient taking differently: Take 100 mg by mouth 3 (three) times daily.  03/22/16  Yes Heath Lark, MD  isosorbide mononitrate (ISMO,MONOKET) 20 MG tablet Take 20 mg by mouth daily.   Yes Historical Provider, MD  lidocaine-prilocaine (EMLA) cream Apply 1 application topically as needed. Apply to [facility] a Cath site at least one hour prior to needle stick 05/10/16  Yes  Heath Lark, MD  magnesium oxide (MAG-OX) 400 MG tablet Take 1 tablet (400 mg total) by mouth daily. 03/22/16  Yes Heath Lark, MD  mirtazapine (REMERON) 15 MG tablet Take 1 tablet (15 mg total) by mouth at bedtime. 06/21/16  Yes Heath Lark, MD  nitroGLYCERIN (NITROSTAT) 0.3 MG SL tablet Place 1 tablet (0.3 mg total) under the tongue every  5 (five) minutes as needed for chest pain. 03/13/16  Yes Heath Lark, MD  OLANZapine (ZYPREXA) 5 MG tablet Take 1 tablet (5 mg total) by mouth at bedtime. 03/22/16  Yes Heath Lark, MD  ondansetron (ZOFRAN-ODT) 8 MG disintegrating tablet Take 1 tablet (8 mg total) by mouth every 8 (eight) hours as needed for nausea or vomiting. 03/22/16  Yes Heath Lark, MD  oxyCODONE (ROXICODONE) 15 MG immediate release tablet Take 1 tablet (15 mg total) by mouth every 4 (four) hours as needed for severe pain. 08/02/16  Yes Heath Lark, MD  pantoprazole (PROTONIX) 40 MG tablet Take 1 tablet (40 mg total) by mouth daily. 03/22/16  Yes Heath Lark, MD  predniSONE (DELTASONE) 10 MG tablet Take 1 tablet (10 mg total) by mouth daily with breakfast. 07/12/16  Yes Heath Lark, MD  prochlorperazine (COMPAZINE) 10 MG tablet Take 1 tablet (10 mg total) by mouth every 6 (six) hours as needed for nausea or vomiting. 05/31/16  Yes Heath Lark, MD  rivaroxaban (XARELTO) 20 MG TABS tablet Take 1 tablet (20 mg total) by mouth daily with supper. 03/22/16  Yes Heath Lark, MD  Travoprost, BAK Free, (TRAVATAN) 0.004 % SOLN ophthalmic solution Place 1 drop into the left eye at bedtime.    Yes Historical Provider, MD  isosorbide dinitrate (ISORDIL) 20 MG tablet Take 1 tablet (20 mg total) by mouth 2 (two) times daily. Patient not taking: Reported on 08/06/2016 03/22/16   Heath Lark, MD  Oxycodone HCl 10 MG TABS Take 1 tablet (10 mg total) by mouth every 4 (four) hours as needed. Patient not taking: Reported on 08/06/2016 07/12/16   Heath Lark, MD    Allergies  Allergen Reactions  . Lisinopril Swelling and Other (See Comments)    Reaction:  Mouth swelling    FAMILY HISTORY:  family history is not on file.  SOCIAL HISTORY:  reports that he quit smoking about 5 months ago. His smoking use included Cigarettes. He has a 35.00 pack-year smoking history. He has never used smokeless tobacco. He reports that he drinks about 3.6 oz of alcohol per week .  He reports that he does not use drugs.  REVIEW OF SYSTEMS:  POSITIVES IN BOLD Constitutional: Negative for fever, chills, weight loss, malaise/fatigue and diaphoresis.  HENT: Negative for hearing loss, ear pain, nosebleeds, congestion, sore throat, neck pain, tinnitus and ear discharge.   Eyes: Negative for blurred vision, double vision, photophobia, pain, discharge and redness.  Respiratory: Negative for cough, hemoptysis, sputum production, shortness of breath, wheezing and stridor.   Cardiovascular: Negative for chest pain, palpitations, orthopnea, claudication, leg swelling and PND.  Gastrointestinal: Negative for heartburn, nausea, vomiting, abdominal pain, diarrhea, constipation, blood in stool and melena.  Genitourinary: Negative for dysuria, urgency, frequency, hematuria and flank pain.  Musculoskeletal: Negative for myalgias, back pain, joint pain and falls.  Skin: Negative for itching and rash.  Neurological: Negative for dizziness, tingling, tremors, sensory change, speech change, focal weakness, seizures, loss of consciousness, weakness and headaches.  Endo/Heme/Allergies: Negative for environmental allergies and polydipsia. Does not bruise/bleed easily.  SUBJECTIVE:   VITAL SIGNS: Temp:  [  98 F (36.7 C)-98.8 F (37.1 C)] 98.4 F (36.9 C) (02/28 0600) Pulse Rate:  [70-107] 70 (02/28 0600) Resp:  [18-35] 18 (02/28 0600) BP: (108-137)/(69-91) 123/79 (02/28 0600) SpO2:  [85 %-100 %] 96 % (02/28 0801)  PHYSICAL EXAMINATION: General:  Thin adult male in NAD Neuro:  AAOx4, speech clear, MAE  HEENT:  Mm pink/moist, edentulous  Cardiovascular:  s1s2 rrr, no m/r/g  Lungs:  Prolonged exp phase, 2L [place], lungs bilaterally with rhonchi R>L posterior Abdomen:  Soft, non-tender, bsx4 active  Musculoskeletal:  No acute deformities  Skin:  Warm/dry, no edema    Recent Labs Lab 08/02/16 1216 08/06/16 1226 08/07/16 0532  NA 137 137 139  K 3.4* 3.6 3.6  CL  --  101 103  CO2 27 29  29   BUN 25.9 9 12   CREATININE 0.9 0.95 0.90  GLUCOSE 97 117* 110*     Recent Labs Lab 08/02/16 1216 08/06/16 1226 08/07/16 0532  HGB 10.9* 10.0* 9.3*  HCT 34.8* 32.1* 29.6*  WBC 6.3 6.8 4.9  PLT 235 238 211    Dg Chest 2 View  Result Date: 08/06/2016 CLINICAL DATA:  Shortness of breath and weakness, lung cancer. Fall last week, right rib pain, initial encounter. EXAM: CHEST  2 VIEW COMPARISON:  CT chest 04/10/2016 and chest radiograph 03/29/2016. FINDINGS: Trachea is midline. Left IJ power port tip projects at the SVC RA junction. Heart size stable. Irregular nodular lesion in the right suprahilar region is new. Left suprahilar irregular nodular consolidation is grossly unchanged. Mild right basilar volume loss. Bullous changes are seen anteriorly. No definite pleural fluid. IMPRESSION: 1. New irregular nodular opacification in the right suprahilar region may be infectious or inflammatory in etiology. Metastatic disease is not excluded. CT chest with contrast may be helpful in further evaluation, as clinically indicated. 2. Irregular left upper lobe nodularity, similar and better evaluated on 04/10/2016. 3. Mild right basilar volume loss may be due to scarring. Difficult to exclude pneumonia. Electronically Signed   By: Lorin Picket M.D.   On: 08/06/2016 13:11   Ct Chest W Contrast  Result Date: 08/07/2016 CLINICAL DATA:  Worsening shortness of breath, hypopharyngeal carcinoma with lung metastases EXAM: CT CHEST WITH CONTRAST TECHNIQUE: Multidetector CT imaging of the chest was performed during intravenous contrast administration. CONTRAST:  75 mL Isovue-300 COMPARISON:  CT chest dated 04/10/2016 FINDINGS: Cardiovascular: The heart is normal in size. No pericardial effusion. Three vessel coronary atherosclerosis. Atherosclerotic calcifications of the aortic arch. Left chest port terminating at the cavoatrial junction. Mediastinum/Nodes: No suspicious mediastinal lymphadenopathy. Visualized  thyroid is unremarkable. Lungs/Pleura: 9 x 15 mm left upper lobe pulmonary nodule (series 7/ image 62), previously 10 x 15 mm, compatible with stable metastasis. Surrounding left upper lobe radiation changes. 2.4 x 1.4 cm spiculated right upper lobe opacity (series 7/ image 44). Additional 1.5 cm right upper lobe spiculated opacity (series 7/image 44). These are new from the prior, favoring infection, with tumor considered less likely. Additional multifocal nodular/ spiculated opacities in the right middle lobe and bilateral lower lobes, including two dominant irregular nodules in the anterior left upper lobe measuring up to 1.5 cm (series 7/image 96). This appearance is new/progressive from the prior, favoring multifocal infection. Bronchiectasis with atelectasis/ collapse in the right middle lobe, chronic. Underlying moderate paraseptal emphysematous changes, upper lobe predominant. No pleural effusion or pneumothorax. Upper Abdomen: Visualized upper abdomen is notable for a mildly nodular hepatic contour and vascular calcifications. Musculoskeletal: Mild superior endplate compression fracture deformity at  T7, new. Mild inferior endplate compression fracture deformity at T9, unchanged. No retropulsion. IMPRESSION: Stable 15 mm left upper lobe pulmonary metastasis. Surrounding radiation changes. Two spiculated right upper lobe opacities measuring up to 2.4 cm, favoring infection, tumor less likely. Additional multifocal irregular nodular opacities in the right middle lobe and bilateral lower lobes, new/ progressive from the prior, favoring multifocal infection/pneumonia. Mild superior endplate compression fracture deformity at T7, new. Stable mild inferior endplate compression fracture deformity at T9. No retropulsion. Electronically Signed   By: Julian Hy M.D.   On: 08/07/2016 17:26      SIGNIFICANT EVENTS  2/26  Admit with dyspnea  STUDIES 2/27  CT Chest w 2/27 >> stable (known) LUL 9x15 mm  nodule and new 2.4x1.4 cm spiculated RUL opacity, additional 1.5 cm RUL spiculated opacity, bronchiectasis with atelectasis/collapse in RML, chronic emphysema of upper lobes and new T7 compression fractures.   ANTIBIOTICS Rocephin 2/26 x1 Azithro 2/26 x1   CULTURES BCx2 2/26 >>   ASSESSMENT / PLAN:  Bilateral Pulmonary Nodules - new spiculated RUL opacity on CT chest, concerning for additional metastatic disease vs acute infection.  Favor metastatic disease given appearance, clinical hx and negative PCT.    Acute on Chronic Hypoxic Respiratory Failure   COPD   Plan: Wean O2 for sats > 90% Pulmonary hygiene - IS, mobilize as able  Follow fever curve, WBC, PCT  Will need ambulatory O2 assessment & adjustment  Continue duoneb TID  Solumedrol 40 mg IV Q12 Dulera  PPI Doubt he would be a candidate for FOB.   Consider palliative care consultation for symptom management.  Attending to review CT images, will follow.    Noe Gens, NP-C [facility] Pgr: [phone] or if no answer [phone] 08/08/2016, 11:25 AM

## 2016-08-08 NOTE — Progress Notes (Signed)
No charge note  I spoke at length with Mr. Gerhold and then with his friend Juliann Pulse.  Patient confidently elects full comfort.  He would like for hospice to come into the home.  He would like to pass on his own terms.  He states clearly "I am at peace in my soul, I don't owe anybody any money, my life insurance is paid up and I do not want to suffer.  I want to die with dignity on my own terms."  I need to have a conversation with his niece and his brother on 3/1 to explain his situation and decisions.  I will start a few comfort meds - liquid morphine for dyspnea, Tussionex for cough, ativan for anxiety or sleep.   Full consult note to follow  Imogene Burn, [place] [facility] Pager: [phone]

## 2016-08-08 NOTE — Progress Notes (Signed)
Up to ambulate in hall on 2 liters oxygen. sats started at 95% then after approx 40- 50 feet decreased to 85. Heart rate started at 75 and increased to 85. Patient was very SOB on return to room o2 continued at 2 liters.

## 2016-08-08 NOTE — Progress Notes (Signed)
PROGRESS NOTE    Zachary Cooper   [id number]  DOB: March 27, 1951  DOA: 08/06/2016 PCP: No PCP Per Patient   Brief Narrative:  Zachary Cooper is a 66 y.o. male with medical history significant of COPD and chronic respite or failure onto his nasal cannula oxygen plus hypopharyngeal carcinoma with metastases who presented to the emergency room after having slowly worsening shortness of breath. No recent cough or fever. In emergency room, patient was quite dyspneic although not wheezing. He received 2 breathing treatments and his breathing had improved.  Subjective: Cough for 2 wks now. No pain.   Assessment & Plan:   Active Problems:      Dyspnea on exertion    Hypopharyngeal carcinoma with metastases to the lung originally diagnosed in 2015    COPD GOLD III-  chronically on 2 L o2 - follows with Dr Melvyn Novas for COPD - started on steroids on admission- very short of breath with minimal exertion- changed Prednisone to Solumedrol- increase frequency of PRN Nebs -obtained CT to see if cancer has progressed which does show progression with a number of new nodules - question of infiltrate on CXR and infection on CT - no  fever or leukocytosis- Pro calcitonin normal- Rocephin and Zithromax given in ER - not resumed - PCCM agrees new nodules on CT are likely metastatic - Dr Alvy Bimler feels he is not a candidate for further chemo- Palliative care consulted.     ? Tachycardia/ A-fib - per note from Dr Melvyn Novas, 10/17, he has a h/o A-fib with RVR - question of A-fib while on monitor temporarily on 2/27 - HR jumping to 150s with mild exertion- may be having A-fib with RVR - transfered to telemetry - on Xarelto as he has a concurrent h/o DVT and PE - 2/28- numerous episodes of PAF with RVR over night- increase Cardizem to 240 mg daily.   H/o DVT / PE and now New A-fib - Xarelto      Anemia of chronic disease Hb is stable    Protein-calorie malnutrition, moderate and weight loss - nutrition  consulted  Deconditioning - Dr Alvy Bimler has set up HHPT recently  Iron deficiency anemia He has recently received intravenous iron.  DVT prophylaxis: Xarelto Code Status: FDNR Family Communication:  Disposition Plan: home when stable Consultants:   oncology Procedures:    Antimicrobials:  Anti-infectives    Start     Dose/Rate Route Frequency Ordered Stop   08/06/16 1415  cefTRIAXone (ROCEPHIN) 1 g in dextrose 5 % 50 mL IVPB     1 g 100 mL/hr over 30 Minutes Intravenous  Once 08/06/16 1408 08/06/16 1532   08/06/16 1415  azithromycin (ZITHROMAX) 500 mg in dextrose 5 % 250 mL IVPB     500 mg 250 mL/hr over 60 Minutes Intravenous  Once 08/06/16 1408 08/06/16 1635       Objective: Vitals:   08/07/16 2049 08/08/16 0204 08/08/16 0600 08/08/16 0801  BP: 108/74  123/79   Pulse: 75  70   Resp: 20  18   Temp: 98.8 F (37.1 C)  98.4 F (36.9 C)   TempSrc: Oral  Oral   SpO2: 99% 98% 99% 96%  Weight:      Height:        Intake/Output Summary (Last 24 hours) at 08/08/16 1318 Last data filed at 08/08/16 0600  Gross per 24 hour  Intake             1440 ml  Output  550 ml  Net              890 ml   Filed Weights   08/06/16 1621  Weight: 71.9 kg (158 lb 8.2 oz)    Examination: General exam: Appears comfortable  HEENT: PERRLA, oral mucosa moist, no sclera icterus or thrush Respiratory system: Clear to auscultation. Respiratory effort normal. Cardiovascular system: S1 & S2 heard, RRR.  No murmurs  Gastrointestinal system: Abdomen soft, non-tender, nondistended. Normal bowel sound. No organomegaly Central nervous system: Alert and oriented. No focal neurological deficits. Extremities: No cyanosis, clubbing or edema Skin: No rashes or ulcers Psychiatry:  Mood & affect appropriate.     Data Reviewed: I have personally reviewed following labs and imaging studies  CBC:  Recent Labs Lab 08/02/16 1216 08/06/16 1226 08/07/16 0532  WBC 6.3 6.8 4.9    NEUTROABS 5.0  --   --   HGB 10.9* 10.0* 9.3*  HCT 34.8* 32.1* 29.6*  MCV 81.7 81.9 82.0  PLT 235 238 [id number]   Basic Metabolic Panel:  Recent Labs Lab 08/02/16 1216 08/06/16 1226 08/07/16 0532  NA 137 137 139  K 3.4* 3.6 3.6  CL  --  101 103  CO2 27 29 29   GLUCOSE 97 117* 110*  BUN 25.9 9 12   CREATININE 0.9 0.95 0.90  CALCIUM 9.3 9.5 9.4   GFR: Estimated Creatinine Clearance: 83.2 mL/min (by C-G formula based on SCr of 0.9 mg/dL). Liver Function Tests:  Recent Labs Lab 08/02/16 1216  AST 45*  ALT 25  ALKPHOS 104  BILITOT 1.24*  PROT 7.6  ALBUMIN 2.8*   No results for input(s): LIPASE, AMYLASE in the last 168 hours. No results for input(s): AMMONIA in the last 168 hours. Coagulation Profile: No results for input(s): INR, PROTIME in the last 168 hours. Cardiac Enzymes: No results for input(s): CKTOTAL, CKMB, CKMBINDEX, TROPONINI in the last 168 hours. BNP (last 3 results) No results for input(s): PROBNP in the last 8760 hours. HbA1C: No results for input(s): HGBA1C in the last 72 hours. CBG: No results for input(s): GLUCAP in the last 168 hours. Lipid Profile: No results for input(s): CHOL, HDL, LDLCALC, TRIG, CHOLHDL, LDLDIRECT in the last 72 hours. Thyroid Function Tests: No results for input(s): TSH, T4TOTAL, FREET4, T3FREE, THYROIDAB in the last 72 hours. Anemia Panel: No results for input(s): VITAMINB12, FOLATE, FERRITIN, TIBC, IRON, RETICCTPCT in the last 72 hours. Urine analysis: No results found for: COLORURINE, APPEARANCEUR, LABSPEC, PHURINE, GLUCOSEU, HGBUR, BILIRUBINUR, KETONESUR, PROTEINUR, UROBILINOGEN, NITRITE, LEUKOCYTESUR Sepsis Labs: @LABRCNTIP (procalcitonin:4,lacticidven:4) ) Recent Results (from the past 240 hour(s))  Culture, blood (routine x 2) Call MD if unable to obtain prior to antibiotics being given     Status: None (Preliminary result)   Collection Time: 08/06/16  5:34 PM  Result Value Ref Range Status   Specimen Description BLOOD  LEFT HAND  Final   Special Requests IN PEDIATRIC BOTTLE 2CC  Final   Culture   Final    NO GROWTH < 12 HOURS Performed at [facility], [place] [address], [place], [place] [address]    Report Status PENDING  Incomplete  Culture, blood (routine x 2) Call MD if unable to obtain prior to antibiotics being given     Status: None (Preliminary result)   Collection Time: 08/06/16  5:40 PM  Result Value Ref Range Status   Specimen Description BLOOD LEFT HAND  Final   Special Requests IN PEDIATRIC BOTTLE 3 CC  Final   Culture  Final    NO GROWTH < 12 HOURS Performed at [place] [address] [place], [place] [address]    Report Status PENDING  Incomplete         Radiology Studies: Ct Chest W Contrast  Result Date: 08/07/2016 CLINICAL DATA:  Worsening shortness of breath, hypopharyngeal carcinoma with lung metastases EXAM: CT CHEST WITH CONTRAST TECHNIQUE: Multidetector CT imaging of the chest was performed during intravenous contrast administration. CONTRAST:  75 mL Isovue-300 COMPARISON:  CT chest dated 04/10/2016 FINDINGS: Cardiovascular: The heart is normal in size. No pericardial effusion. Three vessel coronary atherosclerosis. Atherosclerotic calcifications of the aortic arch. Left chest port terminating at the cavoatrial junction. Mediastinum/Nodes: No suspicious mediastinal lymphadenopathy. Visualized thyroid is unremarkable. Lungs/Pleura: 9 x 15 mm left upper lobe pulmonary nodule (series 7/ image 62), previously 10 x 15 mm, compatible with stable metastasis. Surrounding left upper lobe radiation changes. 2.4 x 1.4 cm spiculated right upper lobe opacity (series 7/ image 44). Additional 1.5 cm right upper lobe spiculated opacity (series 7/image 44). These are new from the prior, favoring infection, with tumor considered less likely. Additional multifocal nodular/ spiculated opacities in the right middle lobe and bilateral lower lobes, including two dominant irregular nodules  in the anterior left upper lobe measuring up to 1.5 cm (series 7/image 96). This appearance is new/progressive from the prior, favoring multifocal infection. Bronchiectasis with atelectasis/ collapse in the right middle lobe, chronic. Underlying moderate paraseptal emphysematous changes, upper lobe predominant. No pleural effusion or pneumothorax. Upper Abdomen: Visualized upper abdomen is notable for a mildly nodular hepatic contour and vascular calcifications. Musculoskeletal: Mild superior endplate compression fracture deformity at T7, new. Mild inferior endplate compression fracture deformity at T9, unchanged. No retropulsion. IMPRESSION: Stable 15 mm left upper lobe pulmonary metastasis. Surrounding radiation changes. Two spiculated right upper lobe opacities measuring up to 2.4 cm, favoring infection, tumor less likely. Additional multifocal irregular nodular opacities in the right middle lobe and bilateral lower lobes, new/ progressive from the prior, favoring multifocal infection/pneumonia. Mild superior endplate compression fracture deformity at T7, new. Stable mild inferior endplate compression fracture deformity at T9. No retropulsion. Electronically Signed   By: Julian Hy M.D.   On: 08/07/2016 17:26      Scheduled Meds: . diltiazem  240 mg Oral Daily  . feeding supplement (ENSURE ENLIVE)  237 mL Oral BID BM  . gabapentin  100 mg Oral TID  . ipratropium-albuterol  3 mL Nebulization TID  . latanoprost  1 drop Left Eye QHS  . magnesium oxide  400 mg Oral Daily  . methylPREDNISolone (SOLU-MEDROL) injection  40 mg Intravenous Q12H  . mirtazapine  15 mg Oral QHS  . mometasone-formoterol  2 puff Inhalation BID  . OLANZapine  5 mg Oral QHS  . pantoprazole  40 mg Oral Daily  . rivaroxaban  20 mg Oral Q supper   Continuous Infusions:   LOS: 1 day    Time spent in minutes: 92    Ray[place], MD [facility] Pager: [email or web address] Password [email or web address] 08/08/2016, 1:18 PM

## 2016-08-08 NOTE — Evaluation (Signed)
Physical Therapy Evaluation Patient Details Name: Joie Slee MRN: [id number] DOB: Dec 29, 1950 Today's Date: 08/08/2016   History of Present Illness  66 yo male admitted with Pna. 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   Clinical Impression  On eval, pt required Min assist for mobility. He walked ~50 feet in hallway. Distance limited by fatigue, dyspnea. O2 sats 89% on 2L O2 during ambulation. Discussed d/c plan-unsure if pt can safely and efficiently function at home in current condition. Could possibly benefit from [place] rehab stay if he is agreeable. If pt returns home, recommend HHPT and home health aide.     Follow Up Recommendations SNF;Supervision/Assistance - 24 hour (if pt agreeable. HHPT and [place] if he decides to return home)    Equipment Recommendations   (4 wheeled rolling walker)    Recommendations for Other Services       Precautions / Restrictions Precautions Precautions: Fall Precaution Comments: monitor O2 sats Restrictions Weight Bearing Restrictions: No      Mobility  Bed Mobility Overal bed mobility: Modified Independent                Transfers Overall transfer level: Needs assistance   Transfers: Sit to/from Stand           General transfer comment: for safety  Ambulation/Gait Ambulation/Gait assistance: Min assist Ambulation Distance (Feet): 50 Feet (15'x1, 50'x1) Assistive device: None (int "furniture walking") Gait Pattern/deviations: Decreased stride length     General Gait Details: Intermittent assist to stabilize. Pt fatigues quickly/easily. O2 sats 89% on 2L, dyspnea 3/4.   Stairs            Wheelchair Mobility    Modified Rankin (Stroke Patients Only)       Balance Overall balance assessment: Needs assistance           Standing balance-Leahy Scale: Good                               Pertinent Vitals/Pain Pain Assessment: No/denies pain     Home Living Family/patient expects to be discharged to:: Private residence Living Arrangements: Other relatives (niece) Available Help at Discharge: Family;Available PRN/intermittently Type of Home: House Home Access: Stairs to enter     Home Layout: One level Home Equipment: None      Prior Function Level of Independence: Independent               Hand Dominance        Extremity/Trunk Assessment   Upper Extremity Assessment Upper Extremity Assessment: Generalized weakness    Lower Extremity Assessment Lower Extremity Assessment: Generalized weakness    Cervical / Trunk Assessment Cervical / Trunk Assessment: Kyphotic  Communication      Cognition Arousal/Alertness: Awake/alert Behavior During Therapy: WFL for tasks assessed/performed Overall Cognitive Status: Within Functional Limits for tasks assessed                      General Comments      Exercises     Assessment/Plan    PT Assessment Patient needs continued PT services  PT Problem List Decreased mobility;Decreased activity tolerance;Decreased knowledge of use of DME;Cardiopulmonary status limiting activity       PT Treatment Interventions DME instruction;Therapeutic activities;Therapeutic exercise;Patient/family education;Functional mobility training;Gait training    PT Goals (Current goals can be found in the Care Plan section)  Acute Rehab PT  Goals Patient Stated Goal: to be able to breathe better PT Goal Formulation: With patient Time For Goal Achievement: 08/22/16 Potential to Achieve Goals: Fair    Frequency Min 3X/week   Barriers to discharge        Co-evaluation               End of Session Equipment Utilized During Treatment: Oxygen Activity Tolerance: Patient limited by fatigue Patient left: in chair;with call bell/phone within reach   PT Visit Diagnosis: Other abnormalities of gait and mobility (R26.89)         Time: [id number] PT Time  Calculation (min) (ACUTE ONLY): 22 min   Charges:   PT Evaluation $PT Eval Low Complexity: 1 Procedure     PT G Codes:         Weston Anna, MPT Pager: [phone]

## 2016-08-09 ENCOUNTER — Telehealth: Payer: Self-pay

## 2016-08-09 DIAGNOSIS — Z66 Do not resuscitate: Secondary | ICD-10-CM

## 2016-08-09 DIAGNOSIS — J9620 Acute and chronic respiratory failure, unspecified whether with hypoxia or hypercapnia: Secondary | ICD-10-CM

## 2016-08-09 DIAGNOSIS — I480 Paroxysmal atrial fibrillation: Secondary | ICD-10-CM

## 2016-08-09 DIAGNOSIS — Z7189 Other specified counseling: Secondary | ICD-10-CM

## 2016-08-09 DIAGNOSIS — J9621 Acute and chronic respiratory failure with hypoxia: Secondary | ICD-10-CM

## 2016-08-09 MED ORDER — POLYETHYLENE GLYCOL 3350 17 G PO PACK: 17.0000 g | PACK | Freq: Every day | ORAL | 0 refills | Status: DC | PRN

## 2016-08-09 MED ORDER — ASPIRIN 81 MG PO TBEC: 81.0000 mg | DELAYED_RELEASE_TABLET | Freq: Every day | ORAL | 0 refills | Status: DC

## 2016-08-09 MED ORDER — ASPIRIN EC 81 MG PO TBEC
81.0000 mg | DELAYED_RELEASE_TABLET | Freq: Every day | ORAL | Status: DC
Start: 2016-08-09 — End: 2016-08-09
  Administered 2016-08-09: 81 mg via ORAL
  Filled 2016-08-09: qty 1

## 2016-08-09 MED ORDER — PREDNISONE 10 MG PO TABS: 10.0000 mg | ORAL_TABLET | Freq: Every day | ORAL | 0 refills | Status: DC

## 2016-08-09 MED ORDER — MORPHINE SULFATE (CONCENTRATE) 10 MG/0.5ML PO SOLN: 5.0000 mg | ORAL | 0 refills | Status: DC | PRN

## 2016-08-09 MED ORDER — LORAZEPAM 0.5 MG PO TABS: 0.5000 mg | ORAL_TABLET | Freq: Three times a day (TID) | ORAL | 0 refills | Status: DC | PRN

## 2016-08-09 MED ORDER — DILTIAZEM HCL ER COATED BEADS 240 MG PO CP24: 240.0000 mg | ORAL_CAPSULE | Freq: Every day | ORAL | 0 refills | Status: DC

## 2016-08-09 MED ORDER — HEPARIN SOD (PORK) LOCK FLUSH 100 UNIT/ML IV SOLN
500.0000 [IU] | INTRAVENOUS | Status: AC | PRN
Start: 2016-08-09 — End: 2016-08-09
  Administered 2016-08-09: 500 [IU]

## 2016-08-09 MED ORDER — PREDNISONE 10 MG PO TABS: 60.0000 mg | ORAL_TABLET | Freq: Every day | ORAL | 0 refills | Status: DC

## 2016-08-09 MED ORDER — ACETAMINOPHEN 325 MG PO TABS: 650.0000 mg | ORAL_TABLET | Freq: Four times a day (QID) | ORAL | Status: DC | PRN

## 2016-08-09 MED ORDER — HYDROCOD POLST-CPM POLST ER 10-8 MG/5ML PO SUER: 5.0000 mL | Freq: Two times a day (BID) | ORAL | 0 refills | Status: DC | PRN

## 2016-08-09 MED ORDER — SENNA 8.6 MG PO TABS: 1.0000 | ORAL_TABLET | Freq: Every day | ORAL | 0 refills | Status: DC

## 2016-08-09 NOTE — Progress Notes (Signed)
Spoke with Hospice nurse Santiago Glad again after speaking with family friend Juliann Pulse. Niece is now willing to allow Hospice in to see pt tonight. Eulas Post, RN

## 2016-08-09 NOTE — Progress Notes (Signed)
Received call from [place] stating niece is very upset with events of patient's discharge. She felt like she was uninformed and not able to accommodate the medical equipment that was ordered. She refused equipment today and was refusing for Hospice nurse to come see patient tonight when he got home. Asencion Noble I would discuss situation with family friend Juliann Pulse and call her back. Eulas Post, RN

## 2016-08-09 NOTE — Consult Note (Signed)
Consultation Note Date: 08/09/2016   Patient Name: Zachary Cooper  DOB: [birth year]  MRN: [id number]  Age / Sex: 66 y.o., male  PCP: No Pcp Per Patient Referring Physician: Debbe Odea, MD  Reason for Consultation: Establishing goals of care in the setting of hypopharyngeal cancer with metastases.  HPI/Patient Profile: 66 y.o. male  with past medical history of COPD on 2L, CHF, CKD, CAD, CVA, seizures, afib, DVT, PE on Xeralto, as well as hypopharyngeal cancer with mets to the lungs.  He was admitted on 08/06/2016 with dyspnea on exertion.  CT this hospitalization showed cancer progression with 2 new spiculated nodules in his right upper lung lobe. He has also been experiencing afib with RVR particularly with any exertion.   Clinical Assessment and Goals of Care:  I have reviewed medical records including EPIC notes, imaging, and labs, received report from Drs Wynelle Cleveland and Alvy Bimler, and then met at the bedside with [name]  to discuss diagnosis prognosis, GOC, EOL wishes, disposition and options.  I introduced Palliative Medicine as specialized medical care for people living with serious illness. It focuses on providing relief from the symptoms and stress of a serious illness. The goal is to improve quality of life for both the patient and the family.  We discussed a brief life review of the patient.  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  He is married with two daughters but unfortunately estranged from his family for many years.  He lives with his niece Venida Jarvis and has a brother, Jackie Plum "T.O.", who is a Company secretary.  Mr. Wedge has a strong religious faith.  Then I assessed functional and nutritional status at home by gathering history. He has primarily a bed to chair existence - becoming very SOB with minimal exertion.  Speaking to me made him SOB.  He has been eating very little at home.   Eating makes him short of breath - but has eaten well here in the hospital. We discussed the progression of his cancer despite chemo therapy.  He understands his prognosis.  In his words, "I won't be here in a year.  I feel like I have between 6 hours and 6 months".  He tells me that at approximately 3 am he wakes and begins coughing - the cough is so awful he feels as though he will die coughing.  When the coughing spasm is over he has great pain all over his body.  Natural disease trajectory and expectations at EOL were discussed.  I attempted to elicit values and goals of care important to the patient.  His fervent wish is to be able to see his daughters and tell them he loves them.  He stated clearly "I don't owe anybody any money, my life insurance is paid up, I am at peace with my soul, and I want to die with dignity on my own terms".  We discussed physician assisted suicide and I explained that it is not legal in [place].  We discussed Hospice services  to help keep him comfortable from discharge until he passes.  That seemed to give him some relief.    Mr. Duchesne was very clear about being allowed to pass with dignity - "I do not want to burden anyone.  I don't want anyone changing my diapers.  If I can't enjoy a TV show or recognize people I'm suppose to recognize then it's time to go".  I don't want to be keep around not knowing who I am or who I'm with.  We discussed his difficulty sleeping  "I can't sleep more than an hour at a time", and his difficulties with cough and anxiety.  After meeting with Mr. Keng - on his instruction, I spoke with his friend Juliann Pulse who received my information well and will relay it to the family.  I then spoke at length with his brother T.O.  He explained that Mr. Cutting had had a stroke and a craniotomy in the past.  He asked if Mr. Noren had capacity to make his own decisions. I assured T.O. Mr. Wingert has capacity and is thinking very clearly and appropriately.  T.O. Appeared  to understand and accept my information about the progress of Mr. Pantano's cancer and Hospice services at home.   Both T.O. And Juliann Pulse are motivated to help reunite Mr. Reggio with his daughters.  Questions and concerns were addressed.  Hard Choices booklet left for review. The family was encouraged to call with questions or concerns.  PMT will continue to support holistically.   Primary Decision Maker:  PATIENT    SUMMARY OF RECOMMENDATIONS    When medically optimized.  Patient will go home with Hospice services to his niece's house. PMT with discuss disease progression, and hospice services with Venida Jarvis (niece) today.  Code Status/Advance Care Planning:  DNR    Symptom Management:   Liquid morphine for dyspnea, pain, anxiety.  Continue Oxycodone 15 mg prn  Continue olanzepine, mirtazepine, gabapentin (home meds)  Add ativan prn anxiety, sleep  Add tussionex prn severe cough  Additional Recommendations (Limitations, Scope, Preferences):  Avoid Hospitalization, Full Comfort Care and Minimize Medications  Psycho-social/Spiritual:   Desire for further Chaplaincy support: no not at this time.  Prognosis:   < 6 months  Discharge Planning: Home with Hospice      Primary Diagnoses: Present on Admission: . CAP (community acquired pneumonia) . Hypopharyngeal cancer ([place]) . COPD GOLD III . Anemia of chronic disease . Protein-calorie malnutrition, moderate ([place]) . Chronic respiratory failure ([place])   I have reviewed the medical record, interviewed the patient and family, and examined the patient. The following aspects are pertinent.  Past Medical History:  Diagnosis Date  . Anemia   . Blood transfusion without reported diagnosis   . Cancer ([place])   . CHF (congestive heart failure) ([place])   . Chronic kidney disease   . Clotting disorder ([place])   . COPD (chronic obstructive pulmonary disease) ([place])   . Hypertension   . Myocardial infarction   . Seizures ([place])   .  Stroke [facility])    Social History   Social History  . Marital status: Single    Spouse name: N/A  . Number of children: 2  . Years of education: N/A   Occupational History  . unemployed    Social History Main Topics  . Smoking status: Former Smoker    Packs/day: 1.00    Years: 35.00    Types: Cigarettes    Quit date: 02/08/2016  . Smokeless tobacco: Never Used  .  Alcohol use 3.6 oz/week    6 Cans of beer per week  . Drug use: No  . Sexual activity: Not Asked   Other Topics Concern  . None   Social History Narrative   Judeen Hammans (brother's daughter) niece MPOA lives with patient   History reviewed. No pertinent family history. Scheduled Meds: . diltiazem  240 mg Oral Daily  . feeding supplement (ENSURE ENLIVE)  237 mL Oral BID BM  . gabapentin  100 mg Oral TID  . ipratropium-albuterol  3 mL Nebulization TID  . latanoprost  1 drop Left Eye QHS  . magnesium oxide  400 mg Oral Daily  . methylPREDNISolone (SOLU-MEDROL) injection  40 mg Intravenous Q12H  . mirtazapine  15 mg Oral QHS  . mometasone-formoterol  2 puff Inhalation BID  . OLANZapine  5 mg Oral QHS  . pantoprazole  40 mg Oral Daily  . rivaroxaban  20 mg Oral Q supper  . senna  1 tablet Oral QHS   Continuous Infusions: PRN Meds:.acetaminophen **OR** acetaminophen, albuterol, chlorpheniramine-HYDROcodone, LORazepam, morphine CONCENTRATE, nitroGLYCERIN, ondansetron **OR** ondansetron (ZOFRAN) IV, oxyCODONE, polyethylene glycol, prochlorperazine, sodium chloride flush Allergies  Allergen Reactions  . Lisinopril Swelling and Other (See Comments)    Reaction:  Mouth swelling   Review of Systems complained of dyspnea, insomnia, cough, pain, anxiety.  Denies bowel changes, dysuria  Physical Exam  Well developed, fatigued appearing, male, lying in bed dyspneic on 2L CV rrr resp increased work of breathing when speaking or rolling to one side.   Vital Signs: BP 111/69 (BP Location: Right Arm)   Pulse 75   Temp 98.6  F (37 C) (Oral)   Resp 19   Ht 6' 1"  (1.854 m)   Wt 71.9 kg (158 lb 8.2 oz)   SpO2 95%   BMI 20.91 kg/m  Pain Assessment: No/denies pain   Pain Score: 0-No pain   SpO2: SpO2: 95 % O2 Device:SpO2: 95 % O2 Flow Rate: .O2 Flow Rate (L/min): 2 L/min  IO: Intake/output summary:  Intake/Output Summary (Last 24 hours) at 08/09/16 0731 Last data filed at 08/09/16 0540  Gross per 24 hour  Intake              960 ml  Output              825 ml  Net              135 ml    LBM: Last BM Date: 08/08/16 Baseline Weight: Weight: 71.9 kg (158 lb 8.2 oz) Most recent weight: Weight: 71.9 kg (158 lb 8.2 oz)     Palliative Assessment/Data:   Flowsheet Rows   Flowsheet Row Most Recent Value  Intake Tab  Referral Department  Hospitalist  Unit at Time of Referral  Oncology Unit  Palliative Care Primary Diagnosis  Cancer  Date Notified  08/08/16  Palliative Care Type  New Palliative care  Reason for referral  Clarify Goals of Care, Counsel Regarding Hospice, Psychosocial or Spiritual support, Non-pain Symptom, Pain  Date of Admission  08/06/16  Date first seen by Palliative Care  08/08/16  # of days Palliative referral response time  0 Day(s)  # of days IP prior to Palliative referral  2  Clinical Assessment  Palliative Performance Scale Score  30%  Pain Min Last 24 hours  1  Dyspnea Max Last 24 Hours  8  Dyspnea Min Last 24 hours  5  Anxiety Max Last 24 Hours  4  Anxiety Min Last  24 Hours  2  Psychosocial & Spiritual Assessment  Palliative Care Outcomes  Patient/Family meeting held?  Yes  Who was at the meeting?  patient  Palliative Care Outcomes  Improved pain interventions, Improved non-pain symptom therapy, Clarified goals of care, Provided psychosocial or spiritual support, Changed to focus on comfort       Time Total: 70 min. Greater than 50%  of this time was spent counseling and coordinating care related to the above assessment and plan.  Signed by: Imogene Burn,  PA-C Palliative Medicine Pager: [phone]  Please contact Palliative Medicine Team phone at [phone] for questions and concerns.  For individual provider: See Shea Evans

## 2016-08-09 NOTE — Progress Notes (Signed)
Dr Olevia Bowens came by to fill out DNR form and family member and pt have decided to "think about" having form signed a little bit longer until pt could reunite with children and discuss with family. Eulas Post, RN

## 2016-08-09 NOTE — Progress Notes (Signed)
Mr. Zachary Cooper is sitting up eating breakfast.   He has eaten well.  He states he does not eat well at home because his niece goes to work and there is no one there to prepare the food for him.   He chuckles and says "Dr. Alvy Bimler came by this morning and told me the same thing I told you yesterday".  He seems very comfortable with the decision of - no more chemotherapy, home with hospice services for comfort care.  He comments that he slept better last night after receiving a dose of ativan.  He is sitting up, breakfast plate clean, + Hiccups, coarse respiratory sounds.  Pleasant mood.  Coherent thinking.  Stutters when he talks sometimes.  I spoke with Zachary Cooper (HCPOA) on the phone.  She asked questions about discontinuing chemo therapy, prognosis, and on-going support.   We also discussed hospice services in the home and hospice house.   Zachary Cooper was accepting of what we discussed and appreciated the information.    Zachary Cooper will need a hospital bed and equipment in place at [facility] home prior to d/c. Please d/c with comfort medications including liquid morphine, ativan and tussionex.   Total time 35 min.  Imogene Burn, [place] Palliative Medicine Pager: [phone]

## 2016-08-09 NOTE — Progress Notes (Signed)
Zachary Cooper   DOB:September 18, 1950   [id number]    Subjective: He feels well today.  Denies worsening shortness of breath.  Denies pain.  Objective:  Vitals:   08/08/16 2251 08/09/16 0541  BP: 112/71 111/69  Pulse: 92 75  Resp: 19 19  Temp: 98.4 F (36.9 C) 98.6 F (37 C)     Intake/Output Summary (Last 24 hours) at 08/09/16 [id number] Last data filed at 08/09/16 0540  Gross per 24 hour  Intake              960 ml  Output              825 ml  Net              135 ml    GENERAL:alert, no distress and comfortable SKIN: skin color, texture, turgor are normal, no rashes or significant lesions EYES: normal, Conjunctiva are pink and non-injected, sclera clear Musculoskeletal:no cyanosis of digits and no clubbing  NEURO: alert & oriented x 3 with fluent speech, no focal motor/sensory deficits   Labs:  Lab Results  Component Value Date   WBC 4.9 08/07/2016   HGB 9.3 (L) 08/07/2016   HCT 29.6 (L) 08/07/2016   MCV 82.0 08/07/2016   PLT 211 08/07/2016   NEUTROABS 5.0 08/02/2016    Lab Results  Component Value Date   NA 139 08/07/2016   K 3.6 08/07/2016   CL 103 08/07/2016   CO2 29 08/07/2016    Studies:  Ct Chest W Contrast  Result Date: 08/07/2016 CLINICAL DATA:  Worsening shortness of breath, hypopharyngeal carcinoma with lung metastases EXAM: CT CHEST WITH CONTRAST TECHNIQUE: Multidetector CT imaging of the chest was performed during intravenous contrast administration. CONTRAST:  75 mL Isovue-300 COMPARISON:  CT chest dated 04/10/2016 FINDINGS: Cardiovascular: The heart is normal in size. No pericardial effusion. Three vessel coronary atherosclerosis. Atherosclerotic calcifications of the aortic arch. Left chest port terminating at the cavoatrial junction. Mediastinum/Nodes: No suspicious mediastinal lymphadenopathy. Visualized thyroid is unremarkable. Lungs/Pleura: 9 x 15 mm left upper lobe pulmonary nodule (series 7/ image 62), previously 10 x 15 mm, compatible with stable  metastasis. Surrounding left upper lobe radiation changes. 2.4 x 1.4 cm spiculated right upper lobe opacity (series 7/ image 44). Additional 1.5 cm right upper lobe spiculated opacity (series 7/image 44). These are new from the prior, favoring infection, with tumor considered less likely. Additional multifocal nodular/ spiculated opacities in the right middle lobe and bilateral lower lobes, including two dominant irregular nodules in the anterior left upper lobe measuring up to 1.5 cm (series 7/image 96). This appearance is new/progressive from the prior, favoring multifocal infection. Bronchiectasis with atelectasis/ collapse in the right middle lobe, chronic. Underlying moderate paraseptal emphysematous changes, upper lobe predominant. No pleural effusion or pneumothorax. Upper Abdomen: Visualized upper abdomen is notable for a mildly nodular hepatic contour and vascular calcifications. Musculoskeletal: Mild superior endplate compression fracture deformity at T7, new. Mild inferior endplate compression fracture deformity at T9, unchanged. No retropulsion. IMPRESSION: Stable 15 mm left upper lobe pulmonary metastasis. Surrounding radiation changes. Two spiculated right upper lobe opacities measuring up to 2.4 cm, favoring infection, tumor less likely. Additional multifocal irregular nodular opacities in the right middle lobe and bilateral lower lobes, new/ progressive from the prior, favoring multifocal infection/pneumonia. Mild superior endplate compression fracture deformity at T7, new. Stable mild inferior endplate compression fracture deformity at T9. No retropulsion. Electronically Signed   By: Julian Hy M.D.   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 His most recent CT scan in October 2017 showed a  positive response to treatment  Repeat CT scan is reviewed by myself.  I felt that the abnormal changes seen in his lungs are more compatible with metastatic cancer.  Another possibility would be pneumonitis from his immunotherapy although the pattern suggests more like malignancy. Pulmonary service was consulted and agree with the interpretation that CT findings are most consistent with disease spread  I explained to the patient that, given his refractory disease and frail baseline performance status, he is not a candidate for further chemotherapy.  I will cancel his future appointments at the cancer center I appreciate palliative care consult and transitioning of care to comfort measures  Cancer associated pain He has poorly controlled pain due to recent falls. He has oxycodone 15 mg to take as needed for pain He is not taking his medicine consistently due to intermittent confusion at home He denies pain today  Intermittent confusion at home, resolved Cause is unknown but likely due to hypoxemia due to inconsistent oxygen intake. His mental status has improved since yesterday  Metastasis to lung [facility]) & COPD He has mild COPD This is actually improving on prednisone Continue the same The pattern seen on CT is less likely to be infectious based on my interpretation.  Pro-calcitonin is negative  Protein-calorie malnutrition, moderate (H[facility]) He has profound weight loss since he was last seen here. He is not taking his medication consistently. Previously, he has gained a lot of weight on prednisone but because he forgot to take his medications recently, his appetite has come down. I discussed importance of frequent small meals and taking his medications correctly. Recommend dietitian to see him while he is hospitalized.   Pulmonary embolism ([place]) He had history of DVT and PE documented at [facility] in 2015 He is on chronic anticoagulation therapy. With plan to transition his care to  hospice, I recommend stopping anticoagulation therapy and changing to 81 mg aspirin only.  The risk of bleeding outweighs the benefit for secondary prevention in the future.  Iron deficiency anemia The patient is noted to have iron deficiency anemia, could be due to minor GI bleed from chronic anticoagulation therapy. He has recently received intravenous iron. Recommend stopping anticoagulation therapy as above  Discharge planning He has poor home circumstances.  Appreciate palliative care consult. I discussed with the patient transitioning of care to hospice.  He likes to remain at home for now rather than staying with family members I recommend home hospice plan upon discharge  Heath Lark, MD 08/09/2016  7:42 AM

## 2016-08-09 NOTE — Progress Notes (Signed)
[facility] received request from [place] for family interest in [facility] services at home. Chart reviewed and eligibility confirmed. Met with patient and friend at bedside to explain services and answer questions. Patient tells me plan is to travel by car to confirmed address in chart. [facility] brochure with [facility] contact information provided to patient.    DME discussed. Per patient request, hospital bed with top rails, over bed table, BSC, tub bench and oxygen setup ordered by [facility] with immediate delivery request. [facility] has been given contact information for friend and niece per patient instruction.   [facility] referral center is aware of this referral and will schedule admission visit.   Please send signed out of facility DNR home with patient. Please send signed scripts for discharge medications with patient.  Discharge summary has been faxed to [phone].  RNCM Cookie aware of above.   Please do not hesitate to call with questions.   Thank you,  Erling Conte, LCSW [phone]

## 2016-08-09 NOTE — Progress Notes (Signed)
Pt selected [facility] referral given to in house rep.

## 2016-08-09 NOTE — Discharge Summary (Signed)
Physician Discharge Summary  Zachary Cooper [id number] DOB: Jul 14, 1950 DOA: 08/06/2016  PCP: No PCP Per Patient  Admit date: 08/06/2016 Discharge date: 08/09/2016  Admitted From: home  Disposition:  Home with hospice   Recommendations for Outpatient Follow-up:  1. 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   Brief Summary:  Zachary Cooper a 66 y.o.malewith medical history significant of COPD and chronic respite or failure onto his nasal cannula oxygen plus hypopharyngeal carcinoma with metastases who presented to the emergency room after having slowly worsening shortness of breath. No recent cough or fever. In emergency room, patient was quite dyspneic although not wheezing. He received 2 breathing treatments and his breathing had improved. The following day he ambulated but HR increased to 130 and he was very short of breath with wheezing.    Hospital Course:  Dyspnea on exertion- acute hypoxic resp failure   (A) COPD GOLD III-  chronically on 2 L o2 - follows with Dr Melvyn Novas for COPD - started on steroids on admission- very short of breath with minimal exertion- changed Prednisone to Solumedrol- increase frequency of PRN Nebs- has improved, able to walk down the hall now with O2.  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    A-fib with RVR - per note from Dr Melvyn Novas, 10/17, he has a h/o A-fib with RVR- I could not find any other documentation of A-fib -rapid response called by RN on 2/27 when he ambulated and became short of breath and tachycardic, noted to have A-fib briefly while on monitor - 2/27 HR jumped to 150s with mild exertion- he was not on telemetry continuously and I  transfered him to telemetry   - 2/28- numerous episodes of PAF with RVR over night- increase Cardizem to 240 mg daily- RVR has resolved, will continue this dose- d/c Norvasc   Episodic confusion at home It appears that at home, he is alone from morning to 5 PM as his niece has to work. Family states he goes in and out of lucidity. He states he takes his own meds and may not be taking them appropriately leading to confusion. He apparently also removes his O2. In the hospital he has been quite lucid.  H/o DVT / PE and now New A-fib - Xarelto discontinued by Dr Alvy Bimler as he is at risk for falls and is comfort care    Anemia of chronic disease and Iron deficiency anemia He has recently received intravenous iron. Hb is stable  Protein-calorie malnutrition, moderate and weight loss  Discharge Instructions  Discharge Instructions    Diet general    Complete by:  As directed    Regular diet   Increase activity slowly    Complete by:  As directed      Allergies  as of 08/09/2016      Reactions   Lisinopril Swelling, Other (See Comments)   Reaction:  Mouth swelling      Medication List    STOP taking these medications   amLODipine 10 MG tablet Commonly known as:  NORVASC   nitroGLYCERIN 0.3 MG SL tablet Commonly known as:  NITROSTAT   oxyCODONE 15 MG immediate release tablet Commonly known as:  ROXICODONE   rivaroxaban 20 MG Tabs tablet Commonly known as:  XARELTO     TAKE these medications   acetaminophen 325 MG tablet Commonly known as:  TYLENOL Take 2 tablets (650 mg total) by mouth every 6 (six) hours as needed for mild pain (or Fever >/= 101).   albuterol 1.25 MG/3ML nebulizer solution Commonly known as:  ACCUNEB Take 3 mLs (1.25 mg total) by nebulization every 6 (six) hours as needed for wheezing or shortness of breath.   albuterol 108 (90 Base) MCG/ACT inhaler Commonly known as:  PROVENTIL HFA;VENTOLIN HFA Inhale 2 puffs into the lungs every 4 (four) hours as needed for wheezing or shortness of breath.   aspirin 81 MG EC tablet Take 1 tablet (81 mg total) by mouth daily. Start taking on:  08/10/2016   budesonide-formoterol 160-4.5 MCG/ACT inhaler Commonly known as:  SYMBICORT Inhale 2 puffs into the lungs 2 (two) times daily.   chlorpheniramine-HYDROcodone 10-8 MG/5ML Suer Commonly known as:  TUSSIONEX Take 5 mLs by mouth every 12 (twelve) hours as needed for cough.   diltiazem 240 MG 24 hr capsule Commonly known as:  CARDIZEM CD Take 1 capsule (240 mg total) by mouth daily. What changed:  medication strength  how much to take   gabapentin 100 MG capsule Commonly known as:  NEURONTIN Take 1 capsule (100 mg total) by mouth at bedtime. What changed:  when to take this   isosorbide mononitrate 20 MG tablet Commonly known as:  ISMO,MONOKET Take 20 mg by mouth daily.   lidocaine-prilocaine cream Commonly known as:  EMLA Apply 1 application topically as needed. Apply to [facility] a Cath site at least one hour prior  to needle stick   LORazepam 0.5 MG tablet Commonly known as:  ATIVAN Take 1 tablet (0.5 mg total) by mouth every 8 (eight) hours as needed for anxiety or sleep.   magnesium oxide 400 MG tablet Commonly known as:  MAG-OX Take 1 tablet (400 mg total) by mouth daily.   mirtazapine 15 MG tablet Commonly known as:  REMERON Take 1 tablet (15 mg total) by mouth at bedtime.   morphine CONCENTRATE 10 MG/0.5ML Soln concentrated solution Take 0.25-0.5 mLs (5-10 mg total) by mouth every hour as needed (dyspnea or pain).   OLANZapine 5 MG tablet Commonly known as:  ZYPREXA Take 1 tablet (5 mg total) by mouth at bedtime.   ondansetron 8 MG disintegrating tablet Commonly known as:  ZOFRAN-ODT Take 1 tablet (8 mg total) by mouth every 8 (eight) hours as  needed for nausea or vomiting.   pantoprazole 40 MG tablet Commonly known as:  PROTONIX Take 1 tablet (40 mg total) by mouth daily.   polyethylene glycol packet Commonly known as:  MIRALAX / GLYCOLAX Take 17 g by mouth daily as needed for mild constipation.   predniSONE 10 MG tablet Commonly known as:  DELTASONE Take 6 tablets (60 mg total) by mouth daily with breakfast. Take 6 tabs tomorrow and then decrease by 1 tab daily until finished.  After it is finished continue on 10 mg Prednisone daily What changed:  how much to take  additional instructions   predniSONE 10 MG tablet Commonly known as:  DELTASONE Take 1 tablet (10 mg total) by mouth daily with breakfast. What changed:  You were already taking a medication with the same name, and this prescription was added. Make sure you understand how and when to take each.   prochlorperazine 10 MG tablet Commonly known as:  COMPAZINE Take 1 tablet (10 mg total) by mouth every 6 (six) hours as needed for nausea or vomiting.   senna 8.6 MG Tabs tablet Commonly known as:  SENOKOT Take 1 tablet (8.6 mg total) by mouth at bedtime.   Travoprost (BAK Free) 0.004 % Soln ophthalmic  solution Commonly known as:  TRAVATAN Place 1 drop into the left eye at bedtime.       Allergies  Allergen Reactions  . Lisinopril Swelling and Other (See Comments)    Reaction:  Mouth swelling     Procedures/Studies:    Dg Chest 2 View  Result Date: 08/06/2016 CLINICAL DATA:  Shortness of breath and weakness, lung cancer. Fall last week, right rib pain, initial encounter. EXAM: CHEST  2 VIEW COMPARISON:  CT chest 04/10/2016 and chest radiograph 03/29/2016. FINDINGS: Trachea is midline. Left IJ power port tip projects at the SVC RA junction. Heart size stable. Irregular nodular lesion in the right suprahilar region is new. Left suprahilar irregular nodular consolidation is grossly unchanged. Mild right basilar volume loss. Bullous changes are seen anteriorly. No definite pleural fluid. IMPRESSION: 1. New irregular nodular opacification in the right suprahilar region may be infectious or inflammatory in etiology. Metastatic disease is not excluded. CT chest with contrast may be helpful in further evaluation, as clinically indicated. 2. Irregular left upper lobe nodularity, similar and better evaluated on 04/10/2016. 3. Mild right basilar volume loss may be due to scarring. Difficult to exclude pneumonia. Electronically Signed   By: Lorin Picket M.D.   On: 08/06/2016 13:11   Ct Chest W Contrast  Result Date: 08/07/2016 CLINICAL DATA:  Worsening shortness of breath, hypopharyngeal carcinoma with lung metastases EXAM: CT CHEST WITH CONTRAST TECHNIQUE: Multidetector CT imaging of the chest was performed during intravenous contrast administration. CONTRAST:  75 mL Isovue-300 COMPARISON:  CT chest dated 04/10/2016 FINDINGS: Cardiovascular: The heart is normal in size. No pericardial effusion. Three vessel coronary atherosclerosis. Atherosclerotic calcifications of the aortic arch. Left chest port terminating at the cavoatrial junction. Mediastinum/Nodes: No suspicious mediastinal lymphadenopathy.  Visualized thyroid is unremarkable. Lungs/Pleura: 9 x 15 mm left upper lobe pulmonary nodule (series 7/ image 62), previously 10 x 15 mm, compatible with stable metastasis. Surrounding left upper lobe radiation changes. 2.4 x 1.4 cm spiculated right upper lobe opacity (series 7/ image 44). Additional 1.5 cm right upper lobe spiculated opacity (series 7/image 44). These are new from the prior, favoring infection, with tumor considered less likely. Additional multifocal nodular/ spiculated opacities in the right middle lobe and bilateral lower lobes,  including two dominant irregular nodules in the anterior left upper lobe measuring up to 1.5 cm (series 7/image 96). This appearance is new/progressive from the prior, favoring multifocal infection. Bronchiectasis with atelectasis/ collapse in the right middle lobe, chronic. Underlying moderate paraseptal emphysematous changes, upper lobe predominant. No pleural effusion or pneumothorax. Upper Abdomen: Visualized upper abdomen is notable for a mildly nodular hepatic contour and vascular calcifications. Musculoskeletal: Mild superior endplate compression fracture deformity at T7, new. Mild inferior endplate compression fracture deformity at T9, unchanged. No retropulsion. IMPRESSION: Stable 15 mm left upper lobe pulmonary metastasis. Surrounding radiation changes. Two spiculated right upper lobe opacities measuring up to 2.4 cm, favoring infection, tumor less likely. Additional multifocal irregular nodular opacities in the right middle lobe and bilateral lower lobes, new/ progressive from the prior, favoring multifocal infection/pneumonia. Mild superior endplate compression fracture deformity at T7, new. Stable mild inferior endplate compression fracture deformity at T9. No retropulsion. Electronically Signed   By: Julian Hy M.D.   On: 08/07/2016 17:26       Discharge Exam: Vitals:   08/08/16 2251 08/09/16 0541  BP: 112/71 111/69  Pulse: 92 75  Resp: 19  19  Temp: 98.4 F (36.9 C) 98.6 F (37 C)   Vitals:   08/08/16 2003 08/08/16 2251 08/09/16 0541 08/09/16 0755  BP:  112/71 111/69   Pulse:  92 75   Resp:  19 19   Temp:  98.4 F (36.9 C) 98.6 F (37 C)   TempSrc:  Oral Oral   SpO2: 96% 92% 95% 93%  Weight:      Height:        General: Pt is alert, awake, not in acute distress Cardiovascular: RRR, S1/S2 +, no rubs, no gallops Respiratory: CTA bilaterally, no wheezing, no rhonchi Abdominal: Soft, NT, ND, bowel sounds + Extremities: no edema, no cyanosis    The results of significant diagnostics from this hospitalization (including imaging, microbiology, ancillary and laboratory) are listed below for reference.     Microbiology: Recent Results (from the past 240 hour(s))  Culture, blood (routine x 2) Call MD if unable to obtain prior to antibiotics being given     Status: None (Preliminary result)   Collection Time: 08/06/16  5:34 PM  Result Value Ref Range Status   Specimen Description BLOOD LEFT HAND  Final   Special Requests IN PEDIATRIC BOTTLE 2CC  Final   Culture   Final    NO GROWTH 3 DAYS Performed at [facility], [address] [place], [place] [address]    Report Status PENDING  Incomplete  Culture, blood (routine x 2) Call MD if unable to obtain prior to antibiotics being given     Status: None (Preliminary result)   Collection Time: 08/06/16  5:40 PM  Result Value Ref Range Status   Specimen Description BLOOD LEFT HAND  Final   Special Requests IN PEDIATRIC BOTTLE 3 CC  Final   Culture   Final    NO GROWTH 3 DAYS Performed at [facility], [place] [address] [place], [place] [address]    Report Status PENDING  Incomplete     Labs: BNP (last 3 results)  Recent Labs  08/06/16 1747  BNP 123456*   Basic Metabolic Panel:  Recent Labs Lab 08/02/16 1216 08/06/16 1226 08/07/16 0532  NA 137 137 139  K 3.4* 3.6 3.6  CL  --  101 103  CO2 27 29 29   GLUCOSE 97 117* 110*  BUN 25.9 9  12   CREATININE 0.9 0.95 0.90  CALCIUM 9.3 9.5 9.4   Liver Function Tests:  Recent Labs Lab 08/02/16 1216  AST 45*  ALT 25  ALKPHOS 104  BILITOT 1.24*  PROT 7.6  ALBUMIN 2.8*   No results for input(s): LIPASE, AMYLASE in the last 168 hours. No results for input(s): AMMONIA in the last 168 hours. CBC:  Recent Labs Lab 08/02/16 1216 08/06/16 1226 08/07/16 0532  WBC 6.3 6.8 4.9  NEUTROABS 5.0  --   --   HGB 10.9* 10.0* 9.3*  HCT 34.8* 32.1* 29.6*  MCV 81.7 81.9 82.0  PLT 235 238 211   Cardiac Enzymes: No results for input(s): CKTOTAL, CKMB, CKMBINDEX, TROPONINI in the last 168 hours. BNP: Invalid input(s): POCBNP CBG: No results for input(s): GLUCAP in the last 168 hours. D-Dimer No results for input(s): DDIMER in the last 72 hours. Hgb A1c No results for input(s): HGBA1C in the last 72 hours. Lipid Profile No results for input(s): CHOL, HDL, LDLCALC, TRIG, CHOLHDL, LDLDIRECT in the last 72 hours. Thyroid function studies No results for input(s): TSH, T4TOTAL, T3FREE, THYROIDAB in the last 72 hours.  Invalid input(s): FREET3 Anemia work up No results for input(s): VITAMINB12, FOLATE, FERRITIN, TIBC, IRON, RETICCTPCT in the last 72 hours. Urinalysis No results found for: COLORURINE, APPEARANCEUR, [place], [place], [place], [place], [place], [place], PROTEINUR, UROBILINOGEN, NITRITE, LEUKOCYTESUR Sepsis Labs Invalid input(s): PROCALCITONIN,  WBC,  LACTICIDVEN Microbiology Recent Results (from the past 240 hour(s))  Culture, blood (routine x 2) Call MD if unable to obtain prior to antibiotics being given     Status: None (Preliminary result)   Collection Time: 08/06/16  5:34 PM  Result Value Ref Range Status   Specimen Description BLOOD LEFT HAND  Final   Special Requests IN PEDIATRIC BOTTLE 2CC  Final   Culture   Final    NO GROWTH 3 DAYS Performed at [facility], [place] [address], [place], [place] [address]    Report Status PENDING  Incomplete   Culture, blood (routine x 2) Call MD if unable to obtain prior to antibiotics being given     Status: None (Preliminary result)   Collection Time: 08/06/16  5:40 PM  Result Value Ref Range Status   Specimen Description BLOOD LEFT HAND  Final   Special Requests IN PEDIATRIC BOTTLE 3 CC  Final   Culture   Final    NO GROWTH 3 DAYS Performed at [facility], [place] [address], [place], [place] [address]    Report Status PENDING  Incomplete     Time coordinating discharge: Over 30 minutes  SIGNED:   Debbe Odea, MD  [facility] 08/09/2016, 11:53 AM Pager   If 7PM-7AM, please contact night-coverage [email or web address] Password [email or web address]

## 2016-08-09 NOTE — Telephone Encounter (Signed)
[facility] called to ask if Dr. Alvy Bimler could be the attending.

## 2016-08-10 NOTE — Progress Notes (Addendum)
[facility] was called 3/1 for O2 tank to transport home. Pt discharged home with Hospice.

## 2016-08-11 LAB — CULTURE, BLOOD (ROUTINE X 2)
Culture: NO GROWTH
Culture: NO GROWTH

## 2016-08-15 ENCOUNTER — Other Ambulatory Visit: Payer: Self-pay | Admitting: Hematology and Oncology

## 2016-08-15 ENCOUNTER — Telehealth: Payer: Self-pay | Admitting: *Deleted

## 2016-08-15 NOTE — Telephone Encounter (Signed)
"  This is Zachary Cooper calling for my brother.  We received a call for appointment for scans.  He's under Hospice, why does he need scans.  He can't walk more than 25 feet."  Apologized for any miscommunication.  Scans were ordered to check response to treatment before Hospice referral.

## 2016-08-23 ENCOUNTER — Ambulatory Visit: Payer: Medicare Other

## 2016-08-23 ENCOUNTER — Other Ambulatory Visit: Payer: Medicare Other

## 2016-09-24 ENCOUNTER — Telehealth: Payer: Self-pay | Admitting: Hematology and Oncology

## 2016-09-24 NOTE — Telephone Encounter (Signed)
I spoke with the patient's brother, Marcello Moores. He had multiple questions related to the patient's care He updated me that the patient has improvement of his overall physical condition and he will be transferred out of [place] to a long-term skilled facility We discussed prognosis, will still be in the region of 4-6 months. The physician that oversee the long-term facility will likely resume some of his long-term medications for COPD and his heart condition I do not recommend any further consideration for chemotherapy as they are unlikely to help in his situation. I addressed all his questions and concerns

## 2016-10-09 ENCOUNTER — Other Ambulatory Visit: Payer: Self-pay

## 2016-10-09 ENCOUNTER — Non-Acute Institutional Stay (SKILLED_NURSING_FACILITY): Payer: Medicare Other | Admitting: Adult Health

## 2016-10-09 ENCOUNTER — Encounter: Payer: Self-pay | Admitting: Adult Health

## 2016-10-09 DIAGNOSIS — I480 Paroxysmal atrial fibrillation: Secondary | ICD-10-CM | POA: Diagnosis not present

## 2016-10-09 DIAGNOSIS — C7800 Secondary malignant neoplasm of unspecified lung: Secondary | ICD-10-CM

## 2016-10-09 DIAGNOSIS — J449 Chronic obstructive pulmonary disease, unspecified: Secondary | ICD-10-CM

## 2016-10-09 DIAGNOSIS — C139 Malignant neoplasm of hypopharynx, unspecified: Secondary | ICD-10-CM

## 2016-10-09 DIAGNOSIS — E440 Moderate protein-calorie malnutrition: Secondary | ICD-10-CM

## 2016-10-09 MED ORDER — MORPHINE SULFATE (CONCENTRATE) 20 MG/ML PO SOLN: 20.0000 mg | ORAL | 0 refills | Status: AC | PRN

## 2016-10-09 MED ORDER — LORAZEPAM 1 MG PO TABS: 1.0000 mg | ORAL_TABLET | ORAL | 0 refills | Status: DC | PRN

## 2016-10-09 NOTE — Telephone Encounter (Signed)
Prescription request was received from:  [facility]  [address] [place] [address]  PHONE: [phone]   Fax: [phone] 

## 2016-10-09 NOTE — Progress Notes (Signed)
Location:   [facility] Room Number: 126 A Place of Service:  SNF (31)   CODE STATUS: DNR  Allergies  Allergen Reactions  . Lisinopril Swelling and Other (See Comments)    Reaction:  Mouth swelling    Chief Complaint  Patient presents with  . 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 He was discharged to home with hospice care. He then transferred to [facility] for his end of life care. He has been transferred to this facility for continued hospice care. He tells me that he is currently not having any pain. There are no nursing concerns at this time.    Past Medical History:  Diagnosis Date  . Anemia   . Blood transfusion without reported diagnosis   . Cancer ([place])   . CHF (congestive heart failure) ([place])   . Chronic kidney disease   . Clotting disorder ([place])   . COPD (chronic obstructive pulmonary disease) ([place])   . Hypertension   . Myocardial infarction ([place])   . Seizures ([place])   . Stroke [facility])     Past Surgical History:  Procedure Laterality Date  . GASTROSTOMY W/ FEEDING TUBE    . IR GENERIC HISTORICAL  06/13/2016   IR FLUORO GUIDE PORT INSERTION RIGHT 06/13/2016 Markus Daft, MD [facility]-INTERV RAD  . IR GENERIC HISTORICAL  06/13/2016   IR US GUIDE VASC ACCESS RIGHT 06/13/2016 Markus Daft, MD [facility]INTER[facility]  . PICC LINE REMOVAL ([place] [facility])    . PORT-A-CATH REMOVAL    . TRACHEOSTOMY      Social History   Social History  . Marital status: Single    Spouse name: N/A  . Number of children: 2  . Years of education: N/A   Occupational History  . unemployed    Social History Main Topics  . Smoking status: Former Smoker    Packs/day: 1.00    Years: 35.00    Types: Cigarettes    Quit date: 02/08/2016  . Smokeless tobacco: Never Used  . Alcohol use 3.6 oz/week    6 Cans of beer per week  . Drug use: No  . Sexual activity: Not on file   Other Topics  Concern  . Not on file   Social History Narrative   Judeen Hammans (brother's daughter) niece MPOA lives with patient   History reviewed. No pertinent family history.    VITAL SIGNS BP (!) 133/91   Pulse 83   Temp 97.2 F (36.2 C)   Resp 20   Ht 5\' 11"  (1.803 m)   Wt 156 lb (70.8 kg)   SpO2 94%   BMI 21.76 kg/m   Patient's Medications  New Prescriptions   No medications on file  Previous Medications   GABAPENTIN (NEURONTIN) 100 MG CAPSULE    Take 100 mg by mouth daily.    IPRATROPIUM-ALBUTEROL (DUONEB) 0.5-2.5 (3) MG/3ML SOLN    Take 3 mLs by nebulization every 6 (six) hours as needed.   LORAZEPAM (ATIVAN) 1 MG TABLET    Take 1 mg by mouth every 4 (four) hours as needed for anxiety.   MORPHINE (ROXANOL) 20 MG/ML CONCENTRATED SOLUTION    Take 20 mg by mouth every 4 (four) hours as needed for severe pain.   PREDNISONE (DELTASONE) 10 MG TABLET    Take 1 tablet (10 mg total) by mouth daily with breakfast.  Modified  Medications   No medications on file  Discontinued Medications     SIGNIFICANT DIAGNOSTIC EXAMS   LABS REVIEWED:   08-02-16: wbc 6.3; hgb 10.9; hct 34.8; mcv 81.7; plt 235; glucose 97; bun 25.9; creat 0.9; k+ 3.4; na++ 137; liver normal albumin 2.8; tsh 1.448   Review of Systems  Constitutional: Negative for malaise/fatigue.  Respiratory: Negative for cough and shortness of breath.   Cardiovascular: Negative for chest pain, palpitations and leg swelling.  Gastrointestinal: Negative for abdominal pain, constipation and heartburn.  Musculoskeletal: Negative for back pain, joint pain and myalgias.  Skin: Negative.   Neurological: Negative for dizziness.  Psychiatric/Behavioral: The patient is not nervous/anxious.     Physical Exam  Constitutional: No distress.  Thin  Eyes: Conjunctivae are normal.  Neck: Neck supple. No JVD present. No thyromegaly present.  Cardiovascular: Normal rate, regular rhythm and intact distal pulses.   Respiratory: Effort normal. No  respiratory distress. He has no wheezes.  Breath sounds diminished   GI: Soft. Bowel sounds are normal. He exhibits no distension. There is no tenderness.  Musculoskeletal: He exhibits no edema.  Able to move all extremities   Lymphadenopathy:    He has no cervical adenopathy.  Neurological: He is alert.  Skin: Skin is warm and dry. He is not diaphoretic.  Psychiatric: He has a normal mood and affect.     ASSESSMENT/ PLAN:  1. afib: has had a recent pulmonary embolism heart rate is stable will continue to monitor  2. COPD GOLD III: will continue duoneb every 6 hours as needed; is 02 dependent;   3. Hypopharyngeal cancer: has lung mets: is followed by hospice care: will neurontin 100 mg daily and will  continue roxanol 20 mg every 4 hours as needed   4.  Anxiety: will continue ativan 1 mg every 4 hours as needed   5. Protein calorie malnutrition: albumin 2.8; weight is 156 pounds     Time spent with patient   50  minutes >50% time spent counseling; reviewing medical record; tests; labs; and developing future plan of care   MD is aware of resident's narcotic use and is in agreement with current plan of care. We will attempt to wean resident as apropriate   Ok Edwards NP [facility]  Contact [phone] Monday through Friday 8am- 5pm  After hours call [phone]

## 2016-10-10 ENCOUNTER — Non-Acute Institutional Stay (SKILLED_NURSING_FACILITY): Payer: Medicare Other | Admitting: Internal Medicine

## 2016-10-10 ENCOUNTER — Encounter: Payer: Self-pay | Admitting: Internal Medicine

## 2016-10-10 DIAGNOSIS — J449 Chronic obstructive pulmonary disease, unspecified: Secondary | ICD-10-CM

## 2016-10-10 DIAGNOSIS — I2782 Chronic pulmonary embolism: Secondary | ICD-10-CM | POA: Diagnosis not present

## 2016-10-10 DIAGNOSIS — I480 Paroxysmal atrial fibrillation: Secondary | ICD-10-CM | POA: Diagnosis not present

## 2016-10-10 DIAGNOSIS — C139 Malignant neoplasm of hypopharynx, unspecified: Secondary | ICD-10-CM | POA: Diagnosis not present

## 2016-10-10 NOTE — Addendum Note (Signed)
Addended by: Georgina Snell on: 10/10/2016 12:01 PM   Modules accepted: Level of Service

## 2016-10-10 NOTE — Progress Notes (Signed)
Provider:  Virgie Dad, MD Location:  [facility] Room Number: 126 A Place of Service:  SNF (31)  PCP: No PCP Per Patient Patient Care Team: No Pcp Per Patient as PCP - General (General Practice) Leota Sauers, RN as Oncology Nurse Navigator Heath Lark, MD as Consulting Physician (Hematology and Oncology)  Extended Emergency Contact Information Primary Emergency Contact: Zenaida Deed [place] of [place] Mobile Phone: [phone] Relation: Niece Secondary Emergency Contact: [place] of [place] Phone: [phone] Relation: Brother  Code Status: DNR Goals of Care: Advanced Directive information Advanced Directives 10/10/2016  Does Patient Have a Medical Advance Directive? Yes  Type of Advance Directive Out of facility DNR (pink MOST or yellow form)  Does patient want to make changes to medical advance directive? No - Patient declined  Copy of [place] in Chart? -  Would patient like information on creating a medical advance directive? No - Patient declined  Pre-existing out of facility DNR order (yellow form or pink MOST form) Yellow form placed in chart (order not valid for inpatient use)      Chief Complaint  Patient presents with  .  Discharge From SNF to another facility        HPI: Patient is a 66 y.o. male seen today for admission to SNF for End of life care and hospice care. 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   He was admitted to the hospital with worsening SOB. He was initially treated aggressively for COPD with steroids and Antibiotics. But his repeat CT scan of chest showed progression of his Metastasis. Dr Clovis Pu that he is not candidate for any further therapy and patient was discharged to [facility] place for Hospice  care. He did get little better in [place] place and now is transferred to [facility] for further stay with continous Hospice care. This morning Patient family decided to take patient to another facility which is closer to their home. Patient is doing well in facility. He denies any SOB or cough. His pain is controlled.   Past Medical History:  Diagnosis Date  . Anemia   . Blood transfusion without reported diagnosis   . Cancer ([place])   . CHF (congestive heart failure) ([place])   . Chronic kidney disease   . Clotting disorder ([place])   . COPD (chronic obstructive pulmonary disease) ([place])   . Hypertension   . Myocardial infarction ([place])   . Seizures ([place])   . Stroke [facility])    Past Surgical History:  Procedure Laterality Date  . GASTROSTOMY W/ FEEDING TUBE    . IR GENERIC HISTORICAL  06/13/2016   IR FLUORO GUIDE PORT INSERTION RIGHT 06/13/2016 Markus Daft, MD [facility]  . IR GENERIC HISTORICAL  06/13/2016   IR US GUIDE VASC ACCESS RIGHT 06/13/2016 Markus Daft, MD [facility]  . PICC LINE REMOVAL ([place] [facility]X)    . PORT-A-CATH REMOVAL    . TRACHEOSTOMY      reports that he quit smoking about 8 months ago. His smoking use included Cigarettes. He has a 35.00 pack-year smoking history. He has never used smokeless tobacco. He reports that he drinks about 3.6 oz of alcohol per week . He reports that he does not use drugs. Social History   Social History  . Marital status: Single  Spouse name: N/A  . Number of children: 2  . Years of education: N/A   Occupational History  . unemployed    Social History Main Topics  . Smoking status: Former Smoker    Packs/day: 1.00    Years: 35.00    Types: Cigarettes    Quit date: 02/08/2016  . Smokeless tobacco: Never Used  . Alcohol use 3.6 oz/week    6 Cans of beer per week  . Drug use: No  . Sexual activity: Not on file   Other Topics Concern  . Not on file   Social History Narrative   Judeen Hammans (brother's daughter) niece MPOA lives with patient     Functional Status Survey:    History reviewed. No pertinent family history.  Health Maintenance  Topic Date Due  . COLONOSCOPY  10/09/2017 (Originally 11/29/2000)  . TETANUS/TDAP  10/09/2017 (Originally [birth year]/29/1969)  . Hepatitis C Screening  10/09/2017 (Originally 27-Jun-1950)  . INFLUENZA VACCINE  01/09/2017  . HIV Screening  Completed  . PNA vac Low Risk Adult  Completed    Allergies  Allergen Reactions  . Lisinopril Swelling and Other (See Comments)    Reaction:  Mouth swelling    Outpatient Encounter Prescriptions as of 10/10/2016  Medication Sig  . gabapentin (NEURONTIN) 100 MG capsule Take 100 mg by mouth daily.   Marland Kitchen ipratropium-albuterol (DUONEB) 0.5-2.5 (3) MG/3ML SOLN Take 3 mLs by nebulization every 6 (six) hours as needed.  Marland Kitchen LORazepam (ATIVAN) 1 MG tablet Take 1 tablet (1 mg total) by mouth every 4 (four) hours as needed for anxiety.  Marland Kitchen morphine (ROXANOL) 20 MG/ML concentrated solution Take 1 mL (20 mg total) by mouth every 4 (four) hours as needed for severe pain.  . predniSONE (DELTASONE) 10 MG tablet Take 1 tablet (10 mg total) by mouth daily with breakfast.   No facility-administered encounter medications on file as of 10/10/2016.     Review of Systems  There were no vitals filed for this visit. There is no height or weight on file to calculate BMI. Physical Exam  Constitutional: He appears well-developed and well-nourished.  HENT:  Head: Normocephalic.  Mouth/Throat: Oropharynx is clear and moist.  Eyes: Pupils are equal, round, and reactive to light.  Neck: Neck supple.  Cardiovascular: Normal rate, regular rhythm and normal heart sounds.   No murmur heard. Pulmonary/Chest: Effort normal. No respiratory distress. He has no wheezes. He has no rales.  Decreased BS Bilateral.    Abdominal: Soft. Bowel sounds are normal. He exhibits no distension.  Mild tenderness in upper epigastric area.  Musculoskeletal:  Trace edema b/l  Neurological: He is alert.   Good strength in all extremities.  Skin: Skin is warm and dry.  Psychiatric: He has a normal mood and affect. His behavior is normal. Thought content normal.    Labs reviewed: Basic Metabolic Panel:  Recent Labs  06/13/16 0935  08/02/16 1216 08/06/16 1226 08/07/16 0532  NA 134*  < > 137 137 139  K 4.1  < > 3.4* 3.6 3.6  CL 99*  --   --  101 103  CO2 25  < > 27 29 29   GLUCOSE 84  < > 97 117* 110*  BUN 12  < > 25.9 9 12   CREATININE 0.92  < > 0.9 0.95 0.90  CALCIUM 9.0  < > 9.3 9.5 9.4  < > = values in this interval not displayed. Liver Function Tests:  Recent Labs  06/21/16 1222 07/12/16 1257 08/02/16 1216  AST 40* 23 45*  ALT 14 18 25   ALKPHOS 63 69 104  BILITOT 0.70 0.35 1.24*  PROT 7.8 7.9 7.6  ALBUMIN 3.1* 3.4* 2.8*   No results for input(s): LIPASE, AMYLASE in the last 8760 hours. No results for input(s): AMMONIA in the last 8760 hours. CBC:  Recent Labs  06/21/16 1222 07/12/16 1257 08/02/16 1216 08/06/16 1226 08/07/16 0532  WBC 3.6* 6.3 6.3 6.8 4.9  NEUTROABS 1.2* 4.8 5.0  --   --   HGB 10.0* 10.1* 10.9* 10.0* 9.3*  HCT 32.1* 30.6* 34.8* 32.1* 29.6*  MCV 79.1* 73.7* 81.7 81.9 82.0  PLT 184 220 235 238 211   Cardiac Enzymes: No results for input(s): CKTOTAL, CKMB, CKMBINDEX, TROPONINI in the last 8760 hours. BNP: Invalid input(s): POCBNP No results found for: HGBA1C Lab Results  Component Value Date   TSH 1.448 08/02/2016   Lab Results  Component Value Date   VITAMINB12 470 07/12/2016   No results found for: FOLATE Lab Results  Component Value Date   IRON 16 (L) 07/12/2016   TIBC 418 (H) 07/12/2016   FERRITIN 27 07/12/2016    Imaging and Procedures obtained prior to SNF admission: Dg Chest 2 View  Result Date: 08/06/2016 CLINICAL DATA:  Shortness of breath and weakness, lung cancer. Fall last week, right rib pain, initial encounter. EXAM: CHEST  2 VIEW COMPARISON:  CT chest 04/10/2016 and chest radiograph 03/29/2016. FINDINGS:  Trachea is midline. Left IJ power port tip projects at the SVC RA junction. Heart size stable. Irregular nodular lesion in the right suprahilar region is new. Left suprahilar irregular nodular consolidation is grossly unchanged. Mild right basilar volume loss. Bullous changes are seen anteriorly. No definite pleural fluid. IMPRESSION: 1. New irregular nodular opacification in the right suprahilar region may be infectious or inflammatory in etiology. Metastatic disease is not excluded. CT chest with contrast may be helpful in further evaluation, as clinically indicated. 2. Irregular left upper lobe nodularity, similar and better evaluated on 04/10/2016. 3. Mild right basilar volume loss may be due to scarring. Difficult to exclude pneumonia. Electronically Signed   By: Lorin Picket M.D.   On: 08/06/2016 13:11   Ct Chest W Contrast  Result Date: 08/07/2016 CLINICAL DATA:  Worsening shortness of breath, hypopharyngeal carcinoma with lung metastases EXAM: CT CHEST WITH CONTRAST TECHNIQUE: Multidetector CT imaging of the chest was performed during intravenous contrast administration. CONTRAST:  75 mL Isovue-300 COMPARISON:  CT chest dated 04/10/2016 FINDINGS: Cardiovascular: The heart is normal in size. No pericardial effusion. Three vessel coronary atherosclerosis. Atherosclerotic calcifications of the aortic arch. Left chest port terminating at the cavoatrial junction. Mediastinum/Nodes: No suspicious mediastinal lymphadenopathy. Visualized thyroid is unremarkable. Lungs/Pleura: 9 x 15 mm left upper lobe pulmonary nodule (series 7/ image 62), previously 10 x 15 mm, compatible with stable metastasis. Surrounding left upper lobe radiation changes. 2.4 x 1.4 cm spiculated right upper lobe opacity (series 7/ image 44). Additional 1.5 cm right upper lobe spiculated opacity (series 7/image 44). These are new from the prior, favoring infection, with tumor considered less likely. Additional multifocal nodular/  spiculated opacities in the right middle lobe and bilateral lower lobes, including two dominant irregular nodules in the anterior left upper lobe measuring up to 1.5 cm (series 7/image 96). This appearance is new/progressive from the prior, favoring multifocal infection. Bronchiectasis with atelectasis/ collapse in the right middle lobe, chronic. Underlying moderate paraseptal emphysematous changes, upper lobe predominant. No pleural effusion or pneumothorax. Upper Abdomen: Visualized upper abdomen is  notable for a mildly nodular hepatic contour and vascular calcifications. Musculoskeletal: Mild superior endplate compression fracture deformity at T7, new. Mild inferior endplate compression fracture deformity at T9, unchanged. No retropulsion. IMPRESSION: Stable 15 mm left upper lobe pulmonary metastasis. Surrounding radiation changes. Two spiculated right upper lobe opacities measuring up to 2.4 cm, favoring infection, tumor less likely. Additional multifocal irregular nodular opacities in the right middle lobe and bilateral lower lobes, new/ progressive from the prior, favoring multifocal infection/pneumonia. Mild superior endplate compression fracture deformity at T7, new. Stable mild inferior endplate compression fracture deformity at T9. No retropulsion. Electronically Signed   By: Julian Hy M.D.   On: 08/07/2016 17:26    Assessment/Plan  Patient with HypoPharyngeal Cancer with Mets to Lungs. Not Chemotherapy candidate He is right now only on Duo nebs. He is also on prednisone , Morphine and Ativan He is going to be followed by Hospice in the New facility. Right now patient is comfortable with his breathing. He doesn't need any Follow up labs. Prescription given for Controlled substance.    Family/ staff Communication:   Labs/tests ordered:

## 2016-10-30 ENCOUNTER — Encounter: Payer: Self-pay | Admitting: Adult Health

## 2016-10-30 ENCOUNTER — Non-Acute Institutional Stay (SKILLED_NURSING_FACILITY): Payer: Medicare Other | Admitting: Adult Health

## 2016-10-30 DIAGNOSIS — I480 Paroxysmal atrial fibrillation: Secondary | ICD-10-CM | POA: Diagnosis not present

## 2016-10-30 DIAGNOSIS — C139 Malignant neoplasm of hypopharynx, unspecified: Secondary | ICD-10-CM | POA: Diagnosis not present

## 2016-10-30 DIAGNOSIS — I2782 Chronic pulmonary embolism: Secondary | ICD-10-CM

## 2016-10-30 DIAGNOSIS — E440 Moderate protein-calorie malnutrition: Secondary | ICD-10-CM

## 2016-10-30 DIAGNOSIS — J449 Chronic obstructive pulmonary disease, unspecified: Secondary | ICD-10-CM

## 2016-10-30 NOTE — Progress Notes (Signed)
Location:   [facility] Room Number: 126 A Place of Service:  SNF (31)    CODE STATUS: DNR  Allergies  Allergen Reactions  . Lisinopril Swelling and Other (See Comments)    Reaction:  Mouth swelling    Chief Complaint  Patient presents with  . Discharge Note    Disharge to [employer]    HPI:  He is being discharged to another SNF in order to be closer to family. He is followed by hospice care. He will not need dme or home health. He will need his narcotic prescription written. He will follow up with the medical provider at the receiving facility.    Past Medical History:  Diagnosis Date  . Anemia   . Blood transfusion without reported diagnosis   . Cancer ([place])   . CHF (congestive heart failure) ([place])   . Chronic kidney disease   . Clotting disorder ([place])   . COPD (chronic obstructive pulmonary disease) ([place])   . Hypertension   . Myocardial infarction ([place])   . Seizures ([place])   . Stroke [facility])     Past Surgical History:  Procedure Laterality Date  . GASTROSTOMY W/ FEEDING TUBE    . IR GENERIC HISTORICAL  06/13/2016   IR FLUORO GUIDE PORT INSERTION RIGHT 06/13/2016 Markus Daft, MD [facility]  . IR GENERIC HISTORICAL  06/13/2016   IR US GUIDE VASC ACCESS RIGHT 06/13/2016 Markus Daft, MD [facility]  . PICC LINE REMOVAL ([place] HX)    . PORT-A-CATH REMOVAL    . TRACHEOSTOMY      Social History   Social History  . Marital status: Single    Spouse name: N/A  . Number of children: 2  . Years of education: N/A   Occupational History  . unemployed    Social History Main Topics  . Smoking status: Former Smoker    Packs/day: 1.00    Years: 35.00    Types: Cigarettes    Quit date: 02/08/2016  . Smokeless tobacco: Never Used  . Alcohol use 3.6 oz/week    6 Cans of beer per week  . Drug use: No  . Sexual activity: Not on file   Other Topics Concern  . Not on file   Social History Narrative   Judeen Hammans (brother's daughter) niece MPOA lives with  patient   History reviewed. No pertinent family history.  VITAL SIGNS BP 136/68   Pulse 76   Temp 97.4 F (36.3 C)   Resp 16   Ht 5\' 11"  (1.803 m)   Wt 157 lb 9.6 oz (71.5 kg)   SpO2 96%   BMI 21.98 kg/m   Patient's Medications  New Prescriptions   No medications on file  Previous Medications   GABAPENTIN (NEURONTIN) 100 MG CAPSULE    Take 100 mg by mouth daily.    IPRATROPIUM-ALBUTEROL (DUONEB) 0.5-2.5 (3) MG/3ML SOLN    Take 3 mLs by nebulization every 6 (six) hours as needed.   MORPHINE (ROXANOL) 20 MG/ML CONCENTRATED SOLUTION    Take 1 mL (20 mg total) by mouth every 4 (four) hours as needed for severe pain.   PREDNISONE (DELTASONE) 10 MG TABLET    Take 1 tablet (10 mg total) by mouth daily with breakfast.   TRAZODONE (DESYREL) 50 MG TABLET    Take 25 mg by mouth at bedtime.  Modified Medications   No medications on file  Discontinued Medications   LORAZEPAM (ATIVAN) 1 MG TABLET    Take 1 tablet (  1 mg total) by mouth every 4 (four) hours as needed for anxiety.     SIGNIFICANT DIAGNOSTIC EXAMS  LABS REVIEWED:   08-02-16: wbc 6.3; hgb 10.9; hct 34.8; mcv 81.7; plt 235; glucose 97; bun 25.9; creat 0.9; k+ 3.4; na++ 137; liver normal albumin 2.8; tsh 1.448   Review of Systems  Constitutional: Negative for malaise/fatigue.  Respiratory: Negative for cough and shortness of breath.   Cardiovascular: Negative for chest pain, palpitations and leg swelling.  Gastrointestinal: Negative for abdominal pain, constipation and heartburn.  Musculoskeletal: Negative for back pain, joint pain and myalgias.  Skin: Negative.   Neurological: Negative for dizziness.  Psychiatric/Behavioral: The patient is not nervous/anxious.     Physical Exam  Constitutional: No distress.  Thin  Eyes: Conjunctivae are normal.  Neck: Neck supple. No JVD present. No thyromegaly present.  Cardiovascular: Normal rate, regular rhythm and intact distal pulses.   Respiratory: Effort normal. No  respiratory distress. He has no wheezes.  Breath sounds diminished   GI: Soft. Bowel sounds are normal. He exhibits no distension. There is no tenderness.  Musculoskeletal: He exhibits no edema.  Able to move all extremities   Lymphadenopathy:    He has no cervical adenopathy.  Neurological: He is alert.  Skin: Skin is warm and dry. He is not diaphoretic.  Psychiatric: He has a normal mood and affect.     ASSESSMENT/ PLAN:  Patient is being discharged with the following home health services:  None required   Patient is being discharged with the following durable medical equipment:  None required   Patient has been advised to f/u with their PCP in 1-2 weeks to bring them up to date on their rehab stay.  Social services at facility was responsible for arranging this appointment.  Pt was provided with a 30 day supply of prescriptions for medications and refills must be obtained from their PCP.  For controlled substances, a more limited supply may be provided adequate until PCP appointment only. roxanol 20 mg/mL 30 ml   Time spent with patient  40  minutes >50% time spent counseling; reviewing medical record; tests; labs; and developing future plan of care     Ok Edwards NP [facility]  Contact [phone] Monday through Friday 8am- 5pm  After hours call [phone]

## 2016-11-13 ENCOUNTER — Encounter: Payer: Self-pay | Admitting: Adult Health

## 2017-03-13 ENCOUNTER — Emergency Department (HOSPITAL_COMMUNITY)

## 2017-03-13 ENCOUNTER — Encounter (HOSPITAL_COMMUNITY): Payer: Self-pay | Admitting: Emergency Medicine

## 2017-03-13 ENCOUNTER — Inpatient Hospital Stay (HOSPITAL_COMMUNITY)
Admission: EM | Admit: 2017-03-13 | Discharge: 2017-03-18 | DRG: 175 | Disposition: A | Discharge status: Skilled Nursing Facility | Attending: Oncology | Admitting: Oncology

## 2017-03-13 DIAGNOSIS — J440 Chronic obstructive pulmonary disease with acute lower respiratory infection: Secondary | ICD-10-CM | POA: Diagnosis present

## 2017-03-13 DIAGNOSIS — J441 Chronic obstructive pulmonary disease with (acute) exacerbation: Secondary | ICD-10-CM | POA: Diagnosis present

## 2017-03-13 DIAGNOSIS — Z7901 Long term (current) use of anticoagulants: Secondary | ICD-10-CM | POA: Diagnosis not present

## 2017-03-13 DIAGNOSIS — I2510 Atherosclerotic heart disease of native coronary artery without angina pectoris: Secondary | ICD-10-CM | POA: Diagnosis present

## 2017-03-13 DIAGNOSIS — I2699 Other pulmonary embolism without acute cor pulmonale: Secondary | ICD-10-CM | POA: Diagnosis present

## 2017-03-13 DIAGNOSIS — I4892 Unspecified atrial flutter: Secondary | ICD-10-CM

## 2017-03-13 DIAGNOSIS — Z85818 Personal history of malignant neoplasm of other sites of lip, oral cavity, and pharynx: Secondary | ICD-10-CM | POA: Diagnosis not present

## 2017-03-13 DIAGNOSIS — I2782 Chronic pulmonary embolism: Secondary | ICD-10-CM | POA: Diagnosis not present

## 2017-03-13 DIAGNOSIS — D6959 Other secondary thrombocytopenia: Secondary | ICD-10-CM | POA: Diagnosis present

## 2017-03-13 DIAGNOSIS — Z9981 Dependence on supplemental oxygen: Secondary | ICD-10-CM | POA: Diagnosis not present

## 2017-03-13 DIAGNOSIS — F17210 Nicotine dependence, cigarettes, uncomplicated: Secondary | ICD-10-CM | POA: Diagnosis present

## 2017-03-13 DIAGNOSIS — I4891 Unspecified atrial fibrillation: Secondary | ICD-10-CM | POA: Diagnosis not present

## 2017-03-13 DIAGNOSIS — I252 Old myocardial infarction: Secondary | ICD-10-CM | POA: Diagnosis not present

## 2017-03-13 DIAGNOSIS — Z85118 Personal history of other malignant neoplasm of bronchus and lung: Secondary | ICD-10-CM

## 2017-03-13 DIAGNOSIS — Z86718 Personal history of other venous thrombosis and embolism: Secondary | ICD-10-CM

## 2017-03-13 DIAGNOSIS — J439 Emphysema, unspecified: Secondary | ICD-10-CM

## 2017-03-13 DIAGNOSIS — C7800 Secondary malignant neoplasm of unspecified lung: Secondary | ICD-10-CM | POA: Diagnosis present

## 2017-03-13 DIAGNOSIS — D6481 Anemia due to antineoplastic chemotherapy: Secondary | ICD-10-CM | POA: Diagnosis present

## 2017-03-13 DIAGNOSIS — Z923 Personal history of irradiation: Secondary | ICD-10-CM | POA: Diagnosis not present

## 2017-03-13 DIAGNOSIS — J9621 Acute and chronic respiratory failure with hypoxia: Secondary | ICD-10-CM | POA: Diagnosis not present

## 2017-03-13 DIAGNOSIS — I214 Non-ST elevation (NSTEMI) myocardial infarction: Secondary | ICD-10-CM

## 2017-03-13 DIAGNOSIS — I480 Paroxysmal atrial fibrillation: Secondary | ICD-10-CM | POA: Diagnosis present

## 2017-03-13 DIAGNOSIS — I248 Other forms of acute ischemic heart disease: Secondary | ICD-10-CM | POA: Diagnosis present

## 2017-03-13 DIAGNOSIS — Z515 Encounter for palliative care: Secondary | ICD-10-CM | POA: Diagnosis present

## 2017-03-13 DIAGNOSIS — Z9221 Personal history of antineoplastic chemotherapy: Secondary | ICD-10-CM | POA: Diagnosis not present

## 2017-03-13 DIAGNOSIS — J189 Pneumonia, unspecified organism: Secondary | ICD-10-CM | POA: Diagnosis present

## 2017-03-13 DIAGNOSIS — I10 Essential (primary) hypertension: Secondary | ICD-10-CM | POA: Diagnosis present

## 2017-03-13 DIAGNOSIS — Z66 Do not resuscitate: Secondary | ICD-10-CM | POA: Diagnosis present

## 2017-03-13 DIAGNOSIS — T451X5A Adverse effect of antineoplastic and immunosuppressive drugs, initial encounter: Secondary | ICD-10-CM | POA: Diagnosis present

## 2017-03-13 DIAGNOSIS — J9620 Acute and chronic respiratory failure, unspecified whether with hypoxia or hypercapnia: Secondary | ICD-10-CM | POA: Diagnosis present

## 2017-03-13 DIAGNOSIS — Z79899 Other long term (current) drug therapy: Secondary | ICD-10-CM

## 2017-03-13 DIAGNOSIS — I700 Atherosclerosis of aorta: Secondary | ICD-10-CM | POA: Diagnosis present

## 2017-03-13 DIAGNOSIS — R0600 Dyspnea, unspecified: Secondary | ICD-10-CM | POA: Diagnosis present

## 2017-03-13 DIAGNOSIS — C760 Malignant neoplasm of head, face and neck: Secondary | ICD-10-CM

## 2017-03-13 DIAGNOSIS — Z86711 Personal history of pulmonary embolism: Secondary | ICD-10-CM

## 2017-03-13 LAB — CBC WITH DIFFERENTIAL/PLATELET
BASOS PCT: 0 %
Basophils Absolute: 0 10*3/uL (ref 0.0–0.1)
EOS ABS: 0.1 10*3/uL (ref 0.0–0.7)
Eosinophils Relative: 2 %
HCT: 38.7 % — ABNORMAL LOW (ref 39.0–52.0)
HEMOGLOBIN: 11.8 g/dL — AB (ref 13.0–17.0)
Lymphocytes Relative: 15 %
Lymphs Abs: 1.4 10*3/uL (ref 0.7–4.0)
MCH: 26.6 pg (ref 26.0–34.0)
MCHC: 30.5 g/dL (ref 30.0–36.0)
MCV: 87.2 fL (ref 78.0–100.0)
MONO ABS: 0.8 10*3/uL (ref 0.1–1.0)
MONOS PCT: 9 %
NEUTROS PCT: 74 %
Neutro Abs: 6.7 10*3/uL (ref 1.7–7.7)
PLATELETS: 131 10*3/uL — AB (ref 150–400)
RBC: 4.44 MIL/uL (ref 4.22–5.81)
RDW: 13.8 % (ref 11.5–15.5)
WBC: 9 10*3/uL (ref 4.0–10.5)

## 2017-03-13 LAB — TROPONIN I
TROPONIN I: 0.09 ng/mL — AB (ref <0.03)
Troponin I: 0.1 ng/mL (ref <0.03)

## 2017-03-13 LAB — BRAIN NATRIURETIC PEPTIDE: B Natriuretic Peptide: 91 pg/mL (ref 0.0–100.0)

## 2017-03-13 LAB — BASIC METABOLIC PANEL
Anion gap: 8 (ref 5–15)
BUN: 16 mg/dL (ref 6–20)
CALCIUM: 9 mg/dL (ref 8.9–10.3)
CO2: 32 mmol/L (ref 22–32)
CREATININE: 0.91 mg/dL (ref 0.61–1.24)
Chloride: 96 mmol/L — ABNORMAL LOW (ref 101–111)
GFR calc non Af Amer: >60 mL/min (ref >60)
Glucose, Bld: 90 mg/dL (ref 65–99)
Potassium: 4 mmol/L (ref 3.5–5.1)
Sodium: 136 mmol/L (ref 135–145)

## 2017-03-13 LAB — MRSA PCR SCREENING: MRSA by PCR: POSITIVE — AB

## 2017-03-13 LAB — I-STAT TROPONIN, ED: TROPONIN I, POC: 0.09 ng/mL — AB (ref 0.00–0.08)

## 2017-03-13 MED ORDER — TRAZODONE HCL 100 MG PO TABS
100.0000 mg | ORAL_TABLET | Freq: Every day | ORAL | Status: DC
  Administered 2017-03-13 – 2017-03-17 (×5): 100 mg via ORAL
  Filled 2017-03-13 (×5): qty 1

## 2017-03-13 MED ORDER — MUPIROCIN 2 % EX OINT
1.0000 | TOPICAL_OINTMENT | Freq: Two times a day (BID) | CUTANEOUS | Status: AC
Start: 2017-03-13 — End: 2017-03-18
  Administered 2017-03-13 – 2017-03-18 (×10): 1 via NASAL
  Filled 2017-03-13: qty 22

## 2017-03-13 MED ORDER — DEXTROSE 5 % IV SOLN
1.0000 g | Freq: Three times a day (TID) | INTRAVENOUS | Status: DC
  Filled 2017-03-13: qty 1

## 2017-03-13 MED ORDER — MORPHINE SULFATE (CONCENTRATE) 10 MG/0.5ML PO SOLN
20.0000 mg | ORAL | Status: DC | PRN
  Administered 2017-03-13 – 2017-03-18 (×8): 20 mg via ORAL
  Filled 2017-03-13 (×8): qty 1

## 2017-03-13 MED ORDER — ACETAMINOPHEN 650 MG RE SUPP: 650.0000 mg | Freq: Four times a day (QID) | RECTAL | Status: DC | PRN

## 2017-03-13 MED ORDER — GABAPENTIN 100 MG PO CAPS
100.0000 mg | ORAL_CAPSULE | Freq: Every day | ORAL | Status: DC
  Administered 2017-03-13 – 2017-03-18 (×6): 100 mg via ORAL
  Filled 2017-03-13 (×7): qty 1

## 2017-03-13 MED ORDER — CHLORHEXIDINE GLUCONATE CLOTH 2 % EX PADS
6.0000 | MEDICATED_PAD | Freq: Every day | CUTANEOUS | Status: AC
  Administered 2017-03-14 – 2017-03-18 (×5): 6 via TOPICAL

## 2017-03-13 MED ORDER — ALUM & MAG HYDROXIDE-SIMETH 200-200-20 MG/5ML PO SUSP: 30.0000 mL | ORAL | Status: DC | PRN

## 2017-03-13 MED ORDER — CEFEPIME HCL 2 G IJ SOLR
2.0000 g | Freq: Once | INTRAMUSCULAR | Status: AC
  Administered 2017-03-13: 2 g via INTRAVENOUS
  Filled 2017-03-13: qty 2

## 2017-03-13 MED ORDER — METHYLPREDNISOLONE SODIUM SUCC 125 MG IJ SOLR: 125.0000 mg | Freq: Once | INTRAMUSCULAR | Status: DC

## 2017-03-13 MED ORDER — ENOXAPARIN SODIUM 40 MG/0.4ML ~~LOC~~ SOLN
40.0000 mg | SUBCUTANEOUS | Status: DC
  Administered 2017-03-13: 40 mg via SUBCUTANEOUS
  Filled 2017-03-13: qty 0.4

## 2017-03-13 MED ORDER — ALUMINUM & MAGNESIUM HYDROXIDE 200-200 MG/5ML PO SUSP: 30.0000 mL | ORAL | Status: DC | PRN

## 2017-03-13 MED ORDER — VANCOMYCIN HCL IN DEXTROSE 1-5 GM/200ML-% IV SOLN: 1000.0000 mg | Freq: Three times a day (TID) | INTRAVENOUS | Status: DC

## 2017-03-13 MED ORDER — VANCOMYCIN HCL 10 G IV SOLR
1500.0000 mg | Freq: Once | INTRAVENOUS | Status: DC
  Administered 2017-03-13: 1500 mg via INTRAVENOUS
  Filled 2017-03-13: qty 1500

## 2017-03-13 MED ORDER — DEXTROSE 5 % IV SOLN
500.0000 mg | INTRAVENOUS | Status: DC
  Administered 2017-03-14: 500 mg via INTRAVENOUS
  Filled 2017-03-13 (×4): qty 500

## 2017-03-13 MED ORDER — DEXTROSE 5 % IV SOLN
1.0000 g | INTRAVENOUS | Status: AC
  Administered 2017-03-13 – 2017-03-17 (×5): 1 g via INTRAVENOUS
  Filled 2017-03-13 (×5): qty 10

## 2017-03-13 MED ORDER — LORATADINE 10 MG PO TABS
10.0000 mg | ORAL_TABLET | Freq: Every day | ORAL | Status: DC
  Administered 2017-03-13 – 2017-03-18 (×6): 10 mg via ORAL
  Filled 2017-03-13 (×6): qty 1

## 2017-03-13 MED ORDER — DILTIAZEM HCL 25 MG/5ML IV SOLN
10.0000 mg | Freq: Once | INTRAVENOUS | Status: AC
  Administered 2017-03-13: 10 mg via INTRAVENOUS
  Filled 2017-03-13: qty 5

## 2017-03-13 MED ORDER — IPRATROPIUM-ALBUTEROL 0.5-2.5 (3) MG/3ML IN SOLN
3.0000 mL | Freq: Four times a day (QID) | RESPIRATORY_TRACT | Status: DC
  Administered 2017-03-13 – 2017-03-17 (×16): 3 mL via RESPIRATORY_TRACT
  Filled 2017-03-13 (×18): qty 3

## 2017-03-13 MED ORDER — ACETAMINOPHEN 325 MG PO TABS
650.0000 mg | ORAL_TABLET | Freq: Four times a day (QID) | ORAL | Status: DC | PRN
  Administered 2017-03-14: 650 mg via ORAL
  Filled 2017-03-13: qty 2

## 2017-03-13 MED ORDER — PANTOPRAZOLE SODIUM 40 MG PO TBEC
40.0000 mg | DELAYED_RELEASE_TABLET | Freq: Every day | ORAL | Status: DC
  Administered 2017-03-13 – 2017-03-18 (×6): 40 mg via ORAL
  Filled 2017-03-13 (×6): qty 1

## 2017-03-13 MED ORDER — SODIUM CHLORIDE 0.9 % IV BOLUS (SEPSIS)
500.0000 mL | Freq: Once | INTRAVENOUS | Status: AC
  Administered 2017-03-13: 500 mL via INTRAVENOUS

## 2017-03-13 NOTE — Progress Notes (Signed)
Pt transported to floor via bipap, no apparent complications.  Floor RT aware.

## 2017-03-13 NOTE — ED Triage Notes (Signed)
Per [facility] patient coming from [facility] complaining of SOB. Patient 77% on room air when ems arrived and patient tripoding. Patient saturation up to 94 % with cpap. Patient alert and oriented x4 on arrival to ED. Hx of COPD. Patient reports pneumonia for 4daysa nd antibiotic use.

## 2017-03-13 NOTE — Progress Notes (Signed)
D/t RT being in multiple emergencies (charge RT aware)- unable to eval pt coming off bipap until now.  Removed bipap and gave HHN tx.  While pt on HHN, pt became very SOB, tachypnic and anxious.  Pt states he "can't breathe, I'm not going to make it".  Pt placed back on bipap, RN aware.  Pt tol bipap well currently.

## 2017-03-13 NOTE — Progress Notes (Signed)
Another RT came to bedside for transport to floor- RN unavailable.

## 2017-03-13 NOTE — Progress Notes (Signed)
[facility] Liaison: RN visit @09 :00  Patient was brought by [facility] to ED for SOB. Hospice was notified.  Spoke with patient at bedside. He is resting and appears comfortable on CPAP.  Denies pain or other discomfort.  HR is tachycardic. His O2sats were 77% on RA prior to CPAP.  No family present.   Hospice will continue to follow and anticipate needs.   Thank you.  Farrel [place], RN, [facility] Liaison [phone]   All hospital liaisons are on [place].

## 2017-03-13 NOTE — ED Notes (Signed)
Hospice palliative care Zachary Cooper [phone]

## 2017-03-13 NOTE — ED Notes (Signed)
Assisted patient with use of urinal  

## 2017-03-13 NOTE — ED Notes (Signed)
Admitting aware of troponin level

## 2017-03-13 NOTE — ED Notes (Addendum)
Per respiratory patient unable to tolerate being off BIPAP. Admitting aware.

## 2017-03-13 NOTE — ED Notes (Signed)
Paged IM

## 2017-03-13 NOTE — Progress Notes (Signed)
Pharmacy Antibiotic Note  Zachary Cooper is a 66 y.o. male admitted on 03/13/2017 with pneumonia.  Pharmacy has been consulted for vancomycin dosing. Pt is afebrile and WBC is WNL. Scr is WNL at 0.91.   Plan: Vancomycin 1500mg  IV x 1 then 1gm IV Q8H F/u renal fxn, C&S, clinical status and trough at SS  Height: 6\' 1"  (185.4 cm) Weight: 192 lb (87.1 kg) IBW/kg (Calculated) : 79.9  Temp (24hrs), Avg:98.1 F (36.7 C), Min:98.1 F (36.7 C), Max:98.1 F (36.7 C)  No results for input(s): WBC, CREATININE, LATICACIDVEN, VANCOTROUGH, VANCOPEAK, VANCORANDOM, GENTTROUGH, GENTPEAK, GENTRANDOM, TOBRATROUGH, TOBRAPEAK, TOBRARND, AMIKACINPEAK, AMIKACINTROU, AMIKACIN in the last 168 hours.  CrCl cannot be calculated (Patient's most recent lab result is older than the maximum 21 days allowed.).    Allergies  Allergen Reactions  . Lisinopril Swelling and Other (See Comments)    Reaction:  Mouth swelling    Antimicrobials this admission: Vanc 10/3>> Cefepime 10/3>>  Dose adjustments this admission: N/A  Microbiology results: Pending  Thank you for allowing pharmacy to be a part of this patient's care.  Oluwatimileyin Vivier, Rande Lawman 03/13/2017 10:13 AM

## 2017-03-13 NOTE — ED Notes (Signed)
EDP at bedside  

## 2017-03-13 NOTE — ED Notes (Signed)
Admitting aware of HR

## 2017-03-13 NOTE — ED Provider Notes (Signed)
[facility] DEPT Provider Note   CSN: [id number] Arrival date & time:        History   Chief Complaint Chief Complaint  Patient presents with  . Shortness of Breath    HPI Zachary Cooper is a 66 y.o. male.  Patient is a 66 year old male with a history of COPD, paroxysmal A. Fib, metastatic hypopharyngeal cancer and CHF who presents with shortness of breath. Patient is oxygen dependent at home and currently resides in a nursing facility. He also is in home hospice due to his metastatic cancer. He reports a one-week history of worsening shortness of breath and cough. He has been treated for pneumonia for the last 4 days with antibiotics. He denies any known fevers. No vomiting. He does have some pain to the center of his chest which is nonpleuritic. No leg pain or swelling. He was started on CPAP By EMS an given nebulizer treatments. His breathing has improved. He was noted to be hypoxic in the 70s on EMS arrival but has improved to the mid 90s.      Past Medical History:  Diagnosis Date  . Anemia   . Blood transfusion without reported diagnosis   . Cancer ([place])   . CHF (congestive heart failure) ([place])   . Chronic kidney disease   . Clotting disorder ([place])   . COPD (chronic obstructive pulmonary disease) ([place])   . COPD GOLD III 03/13/2016    - Spirometry 03/29/2016  FEV1 1.09  (32%)  Ratio 40   - 07/05/2016  After extensive coaching HFA effectiveness =    90%     . Hypertension   . Myocardial infarction ([place])   . Seizures ([place])   . Stroke [facility])     Patient Active Problem List   Diagnosis Date Noted  . Acute on chronic respiratory failure ([place]) 08/09/2016  . Paroxysmal A-fib ([place]) 08/09/2016  . Palliative care encounter   . COPD exacerbation ([place])   . Paroxysmal atrial fibrillation (HCC)   . Dehydration 08/02/2016  . Iron deficiency anemia 07/12/2016  . 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 Pancytopenia, acquired ([place]) 06/21/2016  . Deficiency anemia 06/21/2016  .  Protein-calorie malnutrition, moderate ([place]) 05/10/2016  . Physical deconditioning 05/10/2016  . Cancer associated pain 04/16/2016  . Anemia of chronic disease 03/31/2016  . Poor venous access 03/14/2016  . Pulmonary embolism ([place]) 03/14/2016  . Hypopharyngeal cancer ([place]) 03/13/2016  . Encounter for hospice care discussion 03/13/2016  . Metastasis to lung ([place]) 03/13/2016  . COPD GOLD III 03/13/2016  . Angina pectoris ([place]) 03/13/2016  . DNR (do not resuscitate) 03/13/2016  . CHF (congestive heart failure) ([place]) 03/13/2016  . Goals of care, counseling/discussion 03/13/2016    Past Surgical History:  Procedure Laterality Date  . GASTROSTOMY W/ FEEDING TUBE    . IR GENERIC HISTORICAL  06/13/2016   IR FLUORO GUIDE PORT INSERTION RIGHT 06/13/2016 Markus Daft, MD [facility]-INTERV RAD  . IR GENERIC HISTORICAL  06/13/2016   IR US GUIDE VASC ACCESS RIGHT 06/13/2016 Markus Daft, MD [facility]-INTERV RAD  . PICC LINE REMOVAL ([place] HX)    . PORT-A-CATH REMOVAL    . TRACHEOSTOMY         Home Medications    Prior to Admission medications   Medication Sig Start Date End Date Taking? Authorizing Provider  aluminum-magnesium hydroxide 200-200 MG/5ML suspension Take 30 mLs by mouth every 4 (four) hours as needed for indigestion (heartburn).   Yes [provider]  gabapentin (NEURONTIN) 100 MG capsule Take 100  mg by mouth daily.    Yes [provider]  ipratropium-albuterol (DUONEB) 0.5-2.5 (3) MG/3ML SOLN Take 3 mLs by nebulization every 6 (six) hours as needed.   Yes [provider]  loratadine (CLARITIN) 10 MG tablet Take 10 mg by mouth daily.   Yes [provider]  omeprazole (PRILOSEC) 20 MG capsule Take 20 mg by mouth every morning.   Yes [provider]  predniSONE (DELTASONE) 10 MG tablet Take 10 mg by mouth daily. Qd related to chronic respiratory failure   Yes [provider]  traZODone (DESYREL) 100 MG tablet Take 100 mg by mouth at bedtime.    Yes  [provider]  morphine (ROXANOL) 20 MG/ML concentrated solution Take 1 mL (20 mg total) by mouth every 4 (four) hours as needed for severe pain. Patient not taking: Reported on 03/13/2017 10/09/16   Estill Dooms, MD    Family History No family history on file.  Social History Social History  Substance Use Topics  . Smoking status: Former Smoker    Packs/day: 1.00    Years: 35.00    Types: Cigarettes    Quit date: 02/08/2016  . Smokeless tobacco: Never Used  . Alcohol use No     Allergies   Lisinopril   Review of Systems Review of Systems  Constitutional: Positive for fatigue. Negative for chills, diaphoresis and fever.  HENT: Negative for congestion, rhinorrhea and sneezing.   Eyes: Negative.   Respiratory: Positive for cough and shortness of breath. Negative for chest tightness.   Cardiovascular: Positive for chest pain. Negative for leg swelling.  Gastrointestinal: Negative for abdominal pain, blood in stool, diarrhea, nausea and vomiting.  Genitourinary: Negative for difficulty urinating, flank pain, frequency and hematuria.  Musculoskeletal: Negative for arthralgias and back pain.  Skin: Negative for rash.  Neurological: Negative for dizziness, speech difficulty, weakness, numbness and headaches.     Physical Exam Updated Vital Signs BP 104/69 (BP Location: Left Arm)   Pulse 100   Temp 98.4 F (36.9 C) (Axillary)   Resp 12   Ht 6\' 1"  (1.854 m)   Wt 88.7 kg (195 lb 8 oz)   SpO2 98%   BMI 25.79 kg/m   Physical Exam  Constitutional: He is oriented to person, place, and time. He appears well-developed and well-nourished.  HENT:  Head: Normocephalic and atraumatic.  Eyes: Pupils are equal, round, and reactive to light.  Neck: Normal range of motion. Neck supple.  Cardiovascular: Normal rate, regular rhythm and normal heart sounds.   Pulmonary/Chest: He is in respiratory distress. He has no wheezes. He has no rales. He exhibits no tenderness.    atient is on BiPAP. He has some mild accessory muscle use. He is talking in short sentences. His lungs show rhonchi bilaterally  Abdominal: Soft. Bowel sounds are normal. There is no tenderness. There is no rebound and no guarding.  Musculoskeletal: Normal range of motion. He exhibits no edema.  Lymphadenopathy:    He has no cervical adenopathy.  Neurological: He is alert and oriented to person, place, and time.  Skin: Skin is warm and dry. No rash noted.  Psychiatric: He has a normal mood and affect.     ED Treatments / Results  Labs (all labs ordered are listed, but only abnormal results are displayed) Labs Reviewed  MRSA PCR SCREENING - Abnormal; Notable for the following:       Result Value   MRSA by PCR POSITIVE (*)    All other  components within normal limits  BASIC METABOLIC PANEL - Abnormal; Notable for the following:    Chloride 96 (*)    All other components within normal limits  CBC WITH DIFFERENTIAL/PLATELET - Abnormal; Notable for the following:    Hemoglobin 11.8 (*)    HCT 38.7 (*)    Platelets 131 (*)    All other components within normal limits  TROPONIN I - Abnormal; Notable for the following:    Troponin I 0.10 (*)    All other components within normal limits  TROPONIN I - Abnormal; Notable for the following:    Troponin I 0.09 (*)    All other components within normal limits  CBC - Abnormal; Notable for the following:    Hemoglobin 11.8 (*)    HCT 38.2 (*)    Platelets 126 (*)    All other components within normal limits  BASIC METABOLIC PANEL - Abnormal; Notable for the following:    Sodium 134 (*)    Chloride 96 (*)    Calcium 8.5 (*)    All other components within normal limits  TROPONIN I - Abnormal; Notable for the following:    Troponin I 0.12 (*)    All other components within normal limits  I-STAT TROPONIN, ED - Abnormal; Notable for the following:    Troponin i, poc 0.09 (*)    All other components within normal limits  MRSA PCR  SCREENING  BRAIN NATRIURETIC PEPTIDE    EKG  EKG Interpretation  Date/Time:  Wednesday March 13 2017 09:54:19 EDT Ventricular Rate:  135 PR Interval:    QRS Duration: 86 QT Interval:  300 QTC Calculation: 450 R Axis:   44 Text Interpretation:  Sinus tachycardia Nonspecific T abnormalities, lateral leads Confirmed by Malvin Johns [phone]) on 03/13/2017 10:14:04 AM       Radiology Dg Chest Port 1 View  Result Date: 03/13/2017 CLINICAL DATA:  Shortness of breath EXAM: PORTABLE CHEST 1 VIEW COMPARISON:  None. FINDINGS: Mild patchy opacities in the right mid lung and bilateral lower lobes, suspicious for pneumonia. Additional left upper lobe opacity is suspected, although less conspicuous. No pleural effusion or pneumothorax. The heart is normal in size. Left chest port terminates at cavoatrial junction. Cervical spine fixation hardware. IMPRESSION: Multifocal patchy opacities, lower lobe predominant, suspicious for pneumonia. Electronically Signed   By: Julian Hy M.D.   On: 03/13/2017 08:26    Procedures Procedures (including critical care time)  Medications Ordered in ED Medications  loratadine (CLARITIN) tablet 10 mg (10 mg Oral Given 03/13/17 2225)  pantoprazole (PROTONIX) EC tablet 40 mg (40 mg Oral Given 03/13/17 2225)  traZODone (DESYREL) tablet 100 mg (100 mg Oral Given 03/13/17 2220)  gabapentin (NEURONTIN) capsule 100 mg (100 mg Oral Given 03/13/17 2225)  ipratropium-albuterol (DUONEB) 0.5-2.5 (3) MG/3ML nebulizer solution 3 mL (3 mLs Nebulization Given 03/14/17 0249)  enoxaparin (LOVENOX) injection 40 mg (40 mg Subcutaneous Given 03/13/17 2226)  acetaminophen (TYLENOL) tablet 650 mg (650 mg Oral Given 03/14/17 0045)    Or  acetaminophen (TYLENOL) suppository 650 mg ( Rectal See Alternative 03/14/17 0045)  alum & mag hydroxide-simeth (MAALOX/MYLANTA) 200-200-20 MG/5ML suspension 30 mL (not administered)  azithromycin (ZITHROMAX) 500 mg in dextrose 5 % 250 mL IVPB (0 mg  Intravenous Stopped 03/14/17 0347)  cefTRIAXone (ROCEPHIN) 1 g in dextrose 5 % 50 mL IVPB (1 g Intravenous New Bag/Given 03/13/17 1630)  morphine CONCENTRATE 10 MG/0.5ML oral solution 20 mg (20 mg Oral Given 03/14/17 0604)  mupirocin ointment (BACTROBAN)  2 % 1 application (1 application Nasal Given 03/13/17 2220)  Chlorhexidine Gluconate Cloth 2 % PADS 6 each (6 each Topical Given 03/14/17 0604)  ceFEPIme (MAXIPIME) 2 g in dextrose 5 % 50 mL IVPB (0 g Intravenous Stopped 03/13/17 1314)  sodium chloride 0.9 % bolus 500 mL (0 mLs Intravenous Stopped 03/13/17 1236)  diltiazem (CARDIZEM) injection 10 mg (10 mg Intravenous Given 03/13/17 1034)     Initial Impression / Assessment and Plan / ED Course  I have reviewed the triage vital signs and the nursing notes.  Pertinent labs & imaging results that were available during my care of the patient were reviewed by me and considered in my medical decision making (see chart for details).     PT is a 66yo male who presents with shortness of breath and cough. He has been maintained on BiPAP in the emergency department and his breathing has improved. He's maintaining normal oxygen saturations. Chest x-ray shows bilateral pneumonia. He has been on Levaquin in the nursing facility for the last 4 days. He was given IV antibiotics for healthcare associated pneumonia.  He was also given Solu-Medrol for presumed COPD exacerbation as well. He reportedly had wheezing on arrival of EMS although this has cleared after nebulizer treatments. He is noted to be tachycardic in the 130s. His EKG shows what appears to be A. Fib with RVR. He was given dose of Cardizem in the ED with improvement in rate. He has a mild bump in his troponin but denies any ongoing chest pain. There is no ischemic changes noted on EKG. This may be rate related demand ischemia. I will consult unassigned medicine for admission.  I spoke with the IM teaching service resident who will admit pt  CRITICAL  CARE Performed by: Arsenio Schnorr Total critical care time: 60 minutes Critical care time was exclusive of separately billable procedures and treating other patients. Critical care was necessary to treat or prevent imminent or life-threatening deterioration. Critical care was time spent personally by me on the following activities: development of treatment plan with patient and/or surrogate as well as nursing, discussions with consultants, evaluation of patient's response to treatment, examination of patient, obtaining history from patient or surrogate, ordering and performing treatments and interventions, ordering and review of laboratory studies, ordering and review of radiographic studies, pulse oximetry and re-evaluation of patient's condition.   Final Clinical Impressions(s) / ED Diagnoses   Final diagnoses:  COPD exacerbation ([place])  HCAP (healthcare-associated pneumonia)  Atrial fibrillation with RVR ([place])  NSTEMI (non-ST elevated myocardial infarction) [facility])    New Prescriptions Current Discharge Medication List       Malvin Johns, MD 03/14/17 0700

## 2017-03-13 NOTE — H&P (Signed)
Date: 03/13/2017               Patient Name:  Zachary Cooper MRN: [id number]  DOB: [birth year] Age / Sex: 66 y.o., male   PCP: Patient, No Pcp Per         Medical Service: Internal Medicine Teaching Service         Attending Physician: Dr. Beryle Beams, Alyson Locket, MD    First Contact: Dr. Aggie Hacker Pager: [phone]  Second Contact: Dr. Jari Favre Pager: [phone]       After Hours (After 5p/  First Contact Pager: [phone]  weekends / holidays): Second Contact Pager: [phone]   Chief Complaint: Shortness of breath  History of Present Illness: Patient is a 66 year old male with a past medical history of GOLD stage III COPD, paroxysmal atrial fibrillation, congestive heart failure, hypertension, coronary artery disease (prior MI), seizures, prior CVA, prior DVT and PE, and metastatic hypopharyngeal cancer presenting to the hospital via EMS from [place] for acute onset shortness of breath. Patient reports having acute onset shortness of breath when walking to the bathroom this morning. Also reports having associated substernal, sharp chest pain which lasted for 5 minutes after he returned from the bathroom. States he was diagnosed with pneumonia about a week ago and has been taking an antibiotic (Levaquin) since then. Reports having a cough productive of yellow sputum for the past 1 month and he does not believe that his cough is getting worse. He is on 2 L home oxygen. He denies having any fevers, chills, or body aches. States he has been eating well. Denies having any orthopnea or lower extremity edema. Denies any recent travel or calf pain/swelling. No other complaints. History was limited as patient was on BiPAP. He does not remember the name of his PCP.  Per EMS, he was satting 77% on room air; improved with CPAP. On arrival to the ED, he was normotensive but tachycardic. Labs showing troponin 0.09. EKG not suggestive of any acute ischemic changes. Chest x-ray showing multifocal patchy  opacities. He received a dose of IV Cardizem 10 mg in the ED for presumed A. fib with RVR. Subsequent EKG showing A-flutter with heart rate 87. He was satting 100% on CPAP. He received vancomycin, cefepime, and Solu-Medrol in the ED.  Meds:  Current Meds  Medication Sig  . aluminum-magnesium hydroxide 200-200 MG/5ML suspension Take 30 mLs by mouth every 4 (four) hours as needed for indigestion (heartburn).  . gabapentin (NEURONTIN) 100 MG capsule Take 100 mg by mouth daily.   Marland Kitchen ipratropium-albuterol (DUONEB) 0.5-2.5 (3) MG/3ML SOLN Take 3 mLs by nebulization every 6 (six) hours as needed.  . loratadine (CLARITIN) 10 MG tablet Take 10 mg by mouth daily.  Marland Kitchen omeprazole (PRILOSEC) 20 MG capsule Take 20 mg by mouth every morning.  . predniSONE (DELTASONE) 10 MG tablet Take 10 mg by mouth daily. Qd related to chronic respiratory failure  . predniSONE (DELTASONE) 20 MG tablet Take 40 mg by mouth daily. For Dyspnea for 7 days. Started dose on 03-05-17  . traZODone (DESYREL) 100 MG tablet Take 100 mg by mouth at bedtime.      Allergies: Allergies as of 03/13/2017 - Review Complete 03/13/2017  Allergen Reaction Noted  . Lisinopril Swelling 04/28/2013   Past Medical History:  Diagnosis Date  . Anemia   . Blood transfusion without reported diagnosis   . Cancer ([place])   . CHF (congestive heart failure) ([place])   . Chronic kidney disease   .  Clotting disorder ([place])   . COPD (chronic obstructive pulmonary disease) ([place])   . COPD GOLD III 03/13/2016    - Spirometry 03/29/2016  FEV1 1.09  (32%)  Ratio 40   - 07/05/2016  After extensive coaching HFA effectiveness =    90%     . Hypertension   . Myocardial infarction ([place])   . Seizures ([place])   . Stroke [facility])     Family History: Father had a heart problem.   Social History: Smokes 3-4 cigarettes per day; unclear for how long.  Denies ethanol and illicit drug use.   Review of Systems: A complete ROS was negative except as per HPI.  Physical  Exam: Blood pressure 119/70, pulse (!) 101, temperature 98.1 F (36.7 C), temperature source Axillary, resp. rate 20, height 6\' 1"  (1.854 m), weight 192 lb (87.1 kg), SpO2 100 %. Physical Exam  Constitutional: He is oriented to person, place, and time. He appears well-developed and well-nourished.  HENT:  Head: Normocephalic and atraumatic.  Eyes: Right eye exhibits no discharge. Left eye exhibits no discharge.  Cardiovascular: Normal rate, regular rhythm and intact distal pulses.   Pulmonary/Chest:  On CPAP Diffuse rhonchi and mild end expiratory wheezing  Abdominal: Soft. Bowel sounds are normal. There is no tenderness. There is no rebound and no guarding.  Mild right upper quadrant tenderness  Musculoskeletal: He exhibits no edema.  Calves appear symmetrical in size. No erythema, increased warmth, or swelling.  Neurological: He is alert and oriented to person, place, and time.  Slightly somnolent but easily arousable and answering questions appropriately.  Skin: Skin is warm and dry.   Initial EKG: personally reviewed my interpretation is sinus tachycardia with heart rate 137, T-wave inversions in inferior and lateral leads (similar to prior EKGs from February 2018). No acute ischemic changes.  Subsequent EKG showing A-flutter with heart rate 87.  CXR: personally reviewed my interpretation is multifocal patchy opacities.  Assessment & Plan by Problem: Active Problems:   Acute on chronic respiratory failure (HCC)  Acute on chronic hypoxic respiratory failure: Likely setting COPD exacerbation and cancer metastasis to the lungs.  He has a history of Gold stage III COPD and is currently on home oxygen. Noted to be wheezing by EMS and on exam. He continues to decompensate despite being treated for pneumonia with Levaquin on an outpatient basis. Chest x-ray showing multifocal patchy opacities similar to prior x-ray from February 2018. He was hypoxic on EMS arrival - satting 77% on room air.  O2 saturation improved with CPAP. He is afebrile and does not have leukocytosis. As such, bacterial pneumonia is less likely to be an etiology. He could possibly have viral pneumonia. Although his well's score is 2.5 (moderate risk) due tachycardia and hx of active malignancy, will hold off checking d-dimer as it will likely be elevated in the setting of malignancy. If patient continues to decompensate, he may need CT angiogram to rule out PE.  -Admit to stepdown -Discontinue vancomycin and cefepime -Will continue antibiotic treatment overnight with ceftriaxone and azithromycin. Decision to continue antibiotics any further will be made after reassessing the patient tomorrow. -We will see if patient is able to tolerate a trial off of CPAP. Continue supplemental oxygen as needed. -He has already received a dose of Solu-Medrol 125 mg in the ED. -DuoNeb every 6 hours  Troponin elevation: Troponin 0.09 on admission. Patient does have a history of coronary artery disease with prior MI per chart. EKG not suggestive of any acute ischemic  changes. His mild troponin elevation is likely due to demand ischemia. Per chart, last cardiac cath was done in May 2015 at [facility] (unable to access record). -Continue to trend troponin  Paroxysmal A. fib: CHA2DS2VASC 5. Patient is not currently on any home medications for rate or rhythm control. I do not have records from his PCP, as such, am not sure if he was previously on medications and whether they were discontinued in the setting of his terminal illness. Per oncology documentation, patient was previously on chronic anticoagulation therapy due to history of VTE. Anticoagulation was stopped in 08/2016 in the setting of his terminal illness. Noted have presumed A. fib with RVR in the ED and was treated with a dose of IV Cardizem. Subsequent EKG showing rate controlled A-flutter (heart rate 87). -Continue to monitor. He may need additional doses of Cardizem.  History of  congestive heart failure: Currently euvolemic on exam. Last echo done in November 2016 at [facility] demonstrated LVEF of 55% and grade 1 diastolic dysfunction. -Continue to monitor. No acute intervention needed at this time.  History of VTE: Per oncology documentation, patient has a history of prior DVT and PE documented at [facility] in 2015. He was previously on chronic anticoagulation therapy. Anticoagulation was stopped in 08/2016 in the setting of his terminal illness as the risk of bleeding outweighed the benefit of secondary prevention. He was started on Aspirin 81 mg daily which was later discontinued by another provider in May 2018.   Metastatic hypopharyngeal cancer: He is being followed by Dr. Alvy Bimler (oncology) and was previously treated with chemotherapy and radiation. Treatment has recently been terminated and patient is now comfort care due to progression of his disease.  -Continue home Morphine 20 mg q4 prn   Mild right upper quadrant tenderness: During exam, patient complained of mild right upper quadrant tenderness on deep palpation. Denies having any abdominal pain otherwise. Reports having a good appetite and regular bowel movements. -Continue to monitor -Consider ordering a KUB if he continues to complain of abdominal pain  Hypertension: He is not on any home medications at present. Currently normotensive. -Continue to monitor  Mild normocytic anemia and thrombocytopenia: Likely due to receiving chemotherapy in the past. Hemoglobin stable at 11.8 and platelets 131,000. -Continue to monitor  DVT prophylaxis: Lovenox CODE STATUS: DO NOT RESUSCITATE (confirmed with patient)  Dispo: Admit patient to Inpatient with expected length of stay greater than 2 midnights.  Signed: Shela Leff, MD 03/13/2017, 5:20 PM  Pager: [phone]

## 2017-03-14 DIAGNOSIS — I4892 Unspecified atrial flutter: Secondary | ICD-10-CM

## 2017-03-14 DIAGNOSIS — I4891 Unspecified atrial fibrillation: Secondary | ICD-10-CM

## 2017-03-14 DIAGNOSIS — J441 Chronic obstructive pulmonary disease with (acute) exacerbation: Secondary | ICD-10-CM

## 2017-03-14 DIAGNOSIS — I2782 Chronic pulmonary embolism: Secondary | ICD-10-CM

## 2017-03-14 DIAGNOSIS — J9621 Acute and chronic respiratory failure with hypoxia: Secondary | ICD-10-CM

## 2017-03-14 DIAGNOSIS — C760 Malignant neoplasm of head, face and neck: Secondary | ICD-10-CM

## 2017-03-14 DIAGNOSIS — J439 Emphysema, unspecified: Secondary | ICD-10-CM

## 2017-03-14 DIAGNOSIS — C7800 Secondary malignant neoplasm of unspecified lung: Secondary | ICD-10-CM

## 2017-03-14 LAB — CBC
HEMATOCRIT: 38.2 % — AB (ref 39.0–52.0)
HEMOGLOBIN: 11.8 g/dL — AB (ref 13.0–17.0)
MCH: 27.1 pg (ref 26.0–34.0)
MCHC: 30.9 g/dL (ref 30.0–36.0)
MCV: 87.8 fL (ref 78.0–100.0)
Platelets: 126 10*3/uL — ABNORMAL LOW (ref 150–400)
RBC: 4.35 MIL/uL (ref 4.22–5.81)
RDW: 13.7 % (ref 11.5–15.5)
WBC: 9.8 10*3/uL (ref 4.0–10.5)

## 2017-03-14 LAB — BASIC METABOLIC PANEL
Anion gap: 6 (ref 5–15)
BUN: 16 mg/dL (ref 6–20)
CHLORIDE: 96 mmol/L — AB (ref 101–111)
CO2: 32 mmol/L (ref 22–32)
CREATININE: 0.91 mg/dL (ref 0.61–1.24)
Calcium: 8.5 mg/dL — ABNORMAL LOW (ref 8.9–10.3)
GFR calc Af Amer: >60 mL/min (ref >60)
GFR calc non Af Amer: >60 mL/min (ref >60)
GLUCOSE: 85 mg/dL (ref 65–99)
Potassium: 4 mmol/L (ref 3.5–5.1)
Sodium: 134 mmol/L — ABNORMAL LOW (ref 135–145)

## 2017-03-14 LAB — TROPONIN I: Troponin I: 0.12 ng/mL (ref <0.03)

## 2017-03-14 MED ORDER — DILTIAZEM HCL 60 MG PO TABS
60.0000 mg | ORAL_TABLET | Freq: Two times a day (BID) | ORAL | Status: DC
  Administered 2017-03-14 – 2017-03-18 (×9): 60 mg via ORAL
  Filled 2017-03-14 (×9): qty 1

## 2017-03-14 MED ORDER — PREDNISONE 20 MG PO TABS
40.0000 mg | ORAL_TABLET | Freq: Every day | ORAL | Status: AC
  Administered 2017-03-14 – 2017-03-17 (×4): 40 mg via ORAL
  Filled 2017-03-14 (×4): qty 2

## 2017-03-14 MED ORDER — ENOXAPARIN SODIUM 150 MG/ML ~~LOC~~ SOLN
1.5000 mg/kg | SUBCUTANEOUS | Status: DC
  Administered 2017-03-14 – 2017-03-18 (×5): 135 mg via SUBCUTANEOUS
  Filled 2017-03-14 (×6): qty 1

## 2017-03-14 NOTE — Progress Notes (Signed)
Patient placed on BiPAP for the evening without complication. RT will continue to monitor.

## 2017-03-14 NOTE — Clinical Social Work Note (Signed)
Clinical Social Work Assessment  Patient Details  Name: Lavel Rieman MRN: [id number] Date of Birth: 06/02/1951  Date of referral:  03/14/17               Reason for consult:  Facility Placement (from [facility] with hospice following)                Permission sought to share information with:  Facility Sport and exercise psychologist, Family Supports Permission granted to share information::  Yes, Verbal Permission Granted  Name::     Delia::  [place]  Relationship::  niece  Contact Information:  [phone]  Housing/Transportation Living arrangements for the past 2 months:  [place] of Information:  Patient, Other (Comment Required) (niece and hospice liaison) Patient Interpreter Needed:  None Criminal Activity/Legal Involvement Pertinent to Current Situation/Hospitalization:  No - Comment as needed Significant Relationships:  Other Family Members Lives with:  Facility Resident Do you feel safe going back to the place where you live?  Yes Need for family participation in patient care:  Yes (Comment)  Care giving concerns: Patient from [facility] with hospice following. Plan to return there.   Social Worker assessment / plan: Hospice liaison met briefly with CSW in the morning and [facility]. CSW met with patient at bedside and talked to niece, Venida Jarvis, via phone. Patient and niece agreeable for return to [facility] with hospice. CSW to follow and support with discharge.  Employment status:  Retired Forensic scientist:  Other (Comment Required) ([facility]) PT Recommendations:  Not assessed at this time Information / Referral to community resources:  [place]  Patient/Family's Response to care: Patient and niece Patent attorney.  Patient/Family's Understanding of and Emotional Response to Diagnosis, Current  Treatment, and Prognosis: Niece understanding of patient's condition and prognosis. Hopeful for return to SNF with hospice.  Emotional Assessment Appearance:  Appears stated age Attitude/Demeanor/Rapport:  Other, Lethargic (appropriate) Affect (typically observed):  Calm Orientation:  Oriented to Self, Oriented to Situation, Oriented to Place Alcohol / Substance use:  Not Applicable Psych involvement (Current and /or in the community):  No (Comment)  Discharge Needs  Concerns to be addressed:  Discharge Planning Concerns Readmission within the last 30 days:  No Current discharge risk:  Physical Impairment, Terminally ill Barriers to Discharge:  Continued Medical Work up   Estanislado Emms, LCSW 03/14/2017, 3:40 PM

## 2017-03-14 NOTE — Consult Note (Signed)
[place] Nurse wound consult note Reason for Consult: unstageable pressure injury sacrum Wound type: no open wound Pressure Injury POA: No Wound bed: small healed area just over the coccyx area Drainage (amount, consistency, odor) none Periwound: intact  Dressing procedure/placement/frequency: No topical care needed, prophylactic foam in place to protect since patient with history of pressure injury   Discussed POC with patient and bedside nurse.  Re consult if needed, will not follow at this time. Thanks  Keundra Petrucelli [employer], RN,CWOCN, CNS, [place] [phone])

## 2017-03-14 NOTE — Progress Notes (Signed)
Subjective: Patient seen and examined. He was able to be weaned off BiPAP this morning and states his breathing has improved since admission. He denies fever, chills, nausea, or vomiting. He experienced some chest pain over night, which had resolved this morning. He had no other complaints.   Objective:  Vital signs in last 24 hours: Vitals:   03/14/17 0500 03/14/17 0731 03/14/17 0841 03/14/17 1124  BP: 104/69 104/69 99/81   Pulse:  93 (!) 116   Resp:  14    Temp: 98.4 F (36.9 C)  (!) 97.5 F (36.4 C)   TempSrc: Axillary     SpO2:  100% 96% 100%  Weight: 195 lb 8 oz (88.7 kg)     Height:       General: Sitting on side if bed, NAD HEENT: [place]/AT, EOMI, no scleral icterus, PERRL Cardiac: Tachycardic, regular rhythm, No R/M/G appreciated Pulm: Mildly tachypnic with speaking but not at rest on nasal canula, distant breath sounds, no breath sounds heard on left, faint expiratory wheeze appreciated right lower lung field, Abd: soft, non tender, non distended, BS normal Ext: extremities well perfused, no peripheral edema Neuro: alert and oriented X3, cranial nerves II-XII grossly intact   Assessment/Plan:  Active Problems:   Acute on chronic respiratory failure (HCC)  Acute on chronic hypoxic respiratory failure Likely multifactorial and due to a COPD exacerbation in the setting of cancer metastisis to lungs. 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 His shortness of breath and oxygen desaturation could also be due to pulmonary embolism, which the patient is at high risk for. 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 Patient's paroxysmal atrial fibrillation can also be contributing to his SOB. He was noted to be in atrial flutter on repeat EKG in the ED. Patient's respiratory failure is less likely to be due to a bacterial pneumonia as the patient is afebrile with no WBC. Chest x-ray showing multifocal patchy opacities is similar to prior  x-ray from February 2018. Patient is currently improving and has been weaned off BiPAP. He is now on 10L of [place]. Will treat for COPD exacerbation, as well as PE. Will start Cardizem 60 mg BID for rate control.  -Continue Ceftriaxone for COPD exacerbation -Prednisone 40 mg daily for five days -DuoNeb every 6 hours -Therapeutic Lovenox  -Cardizem 60 mg BID   Troponin elevation Troponin 0.09 on admission>>0.10>>0.09>>0.12. Patient does have a history of coronary artery disease with prior MI per chart. EKG not suggestive of any acute ischemic changes. His mild troponin elevation is most likely due to demand ischemia. Per chart, last cardiac cath was done in May 2015 at [facility] (unable to access record). He is currently asymptomatic and denies CP this AM.  -On Tele -Will continue to monitor and reassess if symptoms arise   Paroxysmal A. Fib CHA2DS2VASC 5. Patient is not currently on any home medications for rate or rhythm control or anticoagulation. Per oncology documentation, patient was previously on chronic anticoagulation therapy due to history of PE and VTE. Anticoagulation was stopped in 08/2016 in the setting of his terminal illness. Patient does have a history of atrial fibrillation. Noted have presumed A. fib with RVR in the ED and was treated with a dose of IV Cardizem. Subsequent EKG showing rate controlled A-flutter (heart rate 87). -Cardizem 60 mg q12 hours -May require additional doses  -Cardiac monitoring    Metastatic hypopharyngeal cancer Previously followed by Dr. Alvy Bimler (oncology)  and was treated with chemotherapy, radiation, and immuntherapy. CT chest in 07/2016 had findings consistent with disease spread. Treatment was terminated due to this in 08/2016 and the patient is now comfort care due to progression of his disease.  -Continue home Morphine 20 mg q4 prn     Dispo: Anticipated discharge in approximately 1-2 day(s).   Melanee Spry, MD 03/14/2017, 11:34 AM Pager:  [phone]

## 2017-03-14 NOTE — Progress Notes (Signed)
[facility] [facility]- [facility] RN visit @ 10:30 AM  This is a related and covered GIP admission of 03/13/17 with [facility] diagnosis of carcinoma of the hypopharnyx, per Dr. Earlie Counts. Patient is a DNR with OOF DNR on shadow chart. EMS was activated at [facility], where he resides, when he became very short of breath and c/o chest pain.  He was admitted for treatment of acute on chronic hypoxic respiratory failure.  Visited patient in room, with nursing student at bedside.  Patient alert and oriented.  He is noted to be on 10 L HFNC with normal O2 sats and respirations.  He reports he feels "back to normal."  He wore BiPap overnight to alleviate his shortness of breath.  He denies any pain.  Patient is receiving Duonebs Q 6 hours scheduled.   He is also receiving Rocephin 1 g Q 24 hours IV via implanted left chest port.  Patient has received 3 doses of 20 mg Roxanol po the past 24 hours for c/o pain. Chest x-ray was obtained yesterday, which showed multifocal patchy opacities, lower lobe predominant, suspicious for pneumonia.  WOC RN has been consulted for unstageable pressure injury to sacrum, which prophylactic foam is recommended to place for protection.  Placed updated medication list and transfer summary placed on chart.  [facility] will continue to follow daily and anticipate any discharge needs.  Please feel free to contact with any hospice-related questions or concerns.  Thank you,  Freddi Starr RN, BSN [facility] Liaison [phone]  All hospital liaisons are now on [place].

## 2017-03-15 ENCOUNTER — Inpatient Hospital Stay (HOSPITAL_COMMUNITY)

## 2017-03-15 LAB — CBC
HCT: 35.1 % — ABNORMAL LOW (ref 39.0–52.0)
HEMOGLOBIN: 10.9 g/dL — AB (ref 13.0–17.0)
MCH: 27 pg (ref 26.0–34.0)
MCHC: 31.1 g/dL (ref 30.0–36.0)
MCV: 86.9 fL (ref 78.0–100.0)
Platelets: 131 10*3/uL — ABNORMAL LOW (ref 150–400)
RBC: 4.04 MIL/uL — AB (ref 4.22–5.81)
RDW: 13.2 % (ref 11.5–15.5)
WBC: 7.8 10*3/uL (ref 4.0–10.5)

## 2017-03-15 LAB — BASIC METABOLIC PANEL
ANION GAP: 4 — AB (ref 5–15)
BUN: 18 mg/dL (ref 6–20)
CHLORIDE: 95 mmol/L — AB (ref 101–111)
CO2: 34 mmol/L — ABNORMAL HIGH (ref 22–32)
Calcium: 8.4 mg/dL — ABNORMAL LOW (ref 8.9–10.3)
Creatinine, Ser: 0.9 mg/dL (ref 0.61–1.24)
Glucose, Bld: 122 mg/dL — ABNORMAL HIGH (ref 65–99)
POTASSIUM: 4.5 mmol/L (ref 3.5–5.1)
SODIUM: 133 mmol/L — AB (ref 135–145)

## 2017-03-15 MED ORDER — SODIUM CHLORIDE 0.9% FLUSH: 10.0000 mL | INTRAVENOUS | Status: DC | PRN

## 2017-03-15 MED ORDER — IOPAMIDOL (ISOVUE-370) INJECTION 76%
INTRAVENOUS | Status: AC
  Administered 2017-03-15: 100 mL
  Filled 2017-03-15: qty 100

## 2017-03-15 NOTE — Care Management Note (Addendum)
Case Management Note  Patient Details  Name: Daily Crate MRN: [id number] Date of Birth: 1950-12-29  Subjective/Objective:  Pt presented for Acute on Chronic Respiratory Failure. PTA from [facility] with Hospice Services via [place]. CSW following for disposition needs.                  Action/Plan: CTA confirmed Bilateral PE. Treating with Lovenox Injections. CM did reach out to Hospice Liaison- Lovenox will not be covered under the Hospice Benefit. Unsure if pt will be willing to pay out of pocket. Liaison is checking to see if the Lovenox will be covered in the hospital. No further needs at this time.    Expected Discharge Date:                  Expected Discharge Plan:  [place] (Hospice Services via [facility])  In-House Referral:  Clinical Social Work  Discharge planning Services  CM Consult  Post Acute Care Choice:  NA Choice offered to:  NA  DME Arranged:  N/A DME Agency:  NA  HH Arranged:  NA HH Agency:  NA  Status of Service:  Completed, signed off  If discussed at [employer] of Stay Meetings, dates discussed:    Additional Comments: [place] 03-15-17 Jacqlyn Krauss, RN,BSN  Olivia Mackie with [facility] spoke with patient and pt feels he does not need to be on the Lovenox. Olivia Mackie to reach out to Internal Medicine team to make them aware.  Bethena Roys, RN 03/15/2017, 2:46 PM

## 2017-03-15 NOTE — Discharge Summary (Signed)
Name: Zachary Cooper MRN: [id number] DOB: 31-Oct-1950 66 y.o. PCP: Patient, No Pcp Per  Date of Admission: 03/13/2017  7:17 AM Date of Discharge: 03/18/2017 Attending Physician: Annia Belt, MD  Discharge Diagnosis: 1. Acute on Chronic Respiratory Failure, Pulmonary Embolism  Principal Problem:   Acute on chronic respiratory failure (HCC) Active Problems:   Metastasis to lung (HCC)   Pulmonary embolism (HCC)   Atrial fibrillation with RVR (HCC)   Head and neck cancer (HCC)   Chronic bullous emphysema (HCC)   Atrial flutter (HCC)   Chronic anticoagulation   Discharge Medications: Allergies as of 03/18/2017      Reactions   Lisinopril Swelling   Reaction:  Mouth swelling      Medication List    TAKE these medications   aluminum-magnesium hydroxide 200-200 MG/5ML suspension Take 30 mLs by mouth every 4 (four) hours as needed for indigestion (heartburn).   diltiazem 60 MG tablet Commonly known as:  CARDIZEM Take 1 tablet (60 mg total) by mouth every 12 (twelve) hours.   gabapentin 100 MG capsule Commonly known as:  NEURONTIN Take 100 mg by mouth daily.   ipratropium-albuterol 0.5-2.5 (3) MG/3ML Soln Commonly known as:  DUONEB Take 3 mLs by nebulization every 6 (six) hours as needed.   loratadine 10 MG tablet Commonly known as:  CLARITIN Take 10 mg by mouth daily.   morphine 20 MG/ML concentrated solution Commonly known as:  ROXANOL Take 1 mL (20 mg total) by mouth every 4 (four) hours as needed for severe pain.   omeprazole 20 MG capsule Commonly known as:  PRILOSEC Take 20 mg by mouth every morning.   predniSONE 10 MG tablet Commonly known as:  DELTASONE Take 10 mg by mouth daily. Qd related to chronic respiratory failure What changed:  Another medication with the same name was removed. Continue taking this medication, and follow the directions you see here.   traZODone 100 MG tablet Commonly known as:  DESYREL Take 100 mg by mouth at bedtime.         Disposition and follow-up:   Zachary Cooper was discharged from [facility] in Stable condition.  At the hospital follow up visit please address:  1.  Respiratory symptoms, home supplemental oxygen requirement, BiPAP requirement, Xarelto use, bleeding risk  2.  Labs / imaging needed at time of follow-up: None  3.  Pending labs/ test needing follow-up: None  Follow-up Appointments: Contact information for after-discharge care    [facility] .   Specialty:  [place] Contact information: [address] [place] Hospital Course by problem list: Principal Problem:   Acute on chronic respiratory failure [facility]) Active Problems:   Metastasis to lung [facility])   Pulmonary embolism (HCC)   Atrial fibrillation with RVR (HCC)   Head and neck cancer (HCC)   Chronic bullous emphysema (HCC)   Atrial flutter (HCC)   Chronic anticoagulation   1.  Acute on Chronic Respiratory Failure, Pulmonary Embolism  Zachary Cooper was admitted to [facility] and the Internal Medicine Teaching service for acute onset of dyspnea. Prior to arrival to the emergency department he was hypoxic with an oxygen saturation of 77% and placed on CPAP by EMS. Upon arrival, his oxygen saturation improved with BiPAP. He was normotensive but tachycardic and found to be in atrial fibrillation with RVR. Point of care  troponin was elevated, but there was no evidence of ischemic changes on EKG. Chest xray showed multifocal patchy opacities concerning for pneumonia and empiric antibiotics were initiated. He also received a dose of solumedrol. Duoneb breathing treatments were also initiated. Antibiotics were narrowed to treat the patient for a COPD exacerbation and steroid treatment was continued for a total of 5 days. The patient remained afebrile without leukocytosis. The patient was empirically started  on therapeutic Lovenox due to concern for pulmonary embolism. CT angio of the chest was obtained to evaluate for pulmonary embolism, which did reveal bilateral acute pulmonary emboli. The patient was weaned off BiPAP and saturated well with 2-3 liters supplemental oxygen via nasal canula. BiPAP was utilized intermittently at night for comfort. Long discussions about goals of care were initiated by the medicine team, as well as hospice. The patient ultimately decided to revoke his hospice and pursue treatment for the pulmonary emboli. He was discharged on a Xarelto 15 mg twice daily. He was provided a sample of Xarelto prior to discharge.   2. Atrial Fibrillation with RVR In the emergency department, the patient was tachycardic and found to be in atrial fibrillation with RVR. He received one dose of IV Cardizem in the ED and was placed on cardiac monitoring. He was then transitioned to oral Cardizem 60 mg twice daily with successful rate control, and returned to sinus rhythm. He was discharged on Cardizem 60 mg twice daily.  3. Elevated Troponin The patient's elevated troponin was most likely due to his acute pulmonary emboli as well as demand ischemia secondary to tachycardia. There were no ischemic changes noted on EKG.   4.Metastatic hypopharyngeal cancer  Diagnosed in March 2015, he was previously followed by Dr. Alvy Bimler (oncology)and was treated with chemotherapy, radiation, and immuntherapy. CT chest in 07/2016 had findings consistent with disease spread.Immunotherapy treatment was terminated in 08/2016 due to disease progression, and the patient was comfort care and followed by hospice. CTA this admission showed  resolution of lung disease that was thought to be metastasis earlier this year. This indicates that either the earlier CT chest findings were inflammatory in nature, or that patient may have had delayed response to immune checkpoint inhibitors. The patient was closely followed by a hospice  liaison and was continued on Morphine 20 mg every four hours as needed. The patient made the decision to revoke hospice in order to receive anticoagulation treatment for his bilateral pulmonary embolism. He was told he can be re-evaluated for hospice at any time.   Discharge Vitals:   BP (!) 167/111 (BP Location: Left Arm)   Pulse 98   Temp 97.8 F (36.6 C) (Oral)   Resp 16   Ht 6\' 1"  (1.854 m)   Wt 190 lb 3.2 oz (86.3 kg)   SpO2 97%   BMI 25.09 kg/m   Pertinent Labs, Studies, and Procedures:  CBC Latest Ref Rng & Units 03/18/2017 03/17/2017 03/16/2017  WBC 4.0 - 10.5 K/uL 7.5 7.1 8.6  Hemoglobin 13.0 - 17.0 g/dL 11.1(L) 10.8(L) 10.9(L)  Hematocrit 39.0 - 52.0 % 36.2(L) 34.7(L) 35.2(L)  Platelets 150 - 400 K/uL 140(L) 149(L) 135(L)   BMP Latest Ref Rng & Units 03/18/2017 03/17/2017 03/16/2017  Glucose 65 - 99 mg/dL 98 108(H) 95  BUN 6 - 20 mg/dL 13 12 10   Creatinine 0.61 - 1.24 mg/dL 0.86 0.84 0.77  Sodium 135 - 145 mmol/L 136 136 136  Potassium 3.5 - 5.1 mmol/L 3.9 3.7 3.9  Chloride 101 - 111 mmol/L 98(L)  98(L) 97(L)  CO2 22 - 32 mmol/L 32 32 32  Calcium 8.9 - 10.3 mg/dL 9.1 9.2 8.9   CT Angio Chest PE W or WO Contrast FINDINGS: Cardiovascular: Multiple filling defects are seen in several lower lobe branches of the pulmonary arteries bilaterally, consistent with acute pulmonary emboli. RV LV ratio of 0.7 is noted which is within normal limits. No pericardial effusion is noted. Atherosclerosis of thoracic aorta is noted without aneurysm or dissection. Coronary artery calcifications are noted.  Mediastinum/Nodes: No enlarged mediastinal, hilar, or axillary lymphnodes. Thyroid gland, trachea, and esophagus demonstrate no significant findings.  Lungs/Pleura: No pneumothorax or pleural effusion is noted. Bulla formation is noted in both upper lobes. Right middle lobe atelectasis or infiltrate is noted. Right lower lobe inflammation is noted with associated bronchial  thickening.  Portable Chest Xray FINDINGS: Mild patchy opacities in the right mid lung and bilateral lower lobes, suspicious for pneumonia. Additional left upper lobe opacity is suspected, although less conspicuous. No pleural effusion or pneumothorax. The heart is normal in size. Left chest port terminates at cavoatrial junction. Cervical spine fixation hardware. IMPRESSION: Multifocal patchy opacities, lower lobe predominant, suspicious for Pneumonia.  Discharge Instructions: Discharge Instructions    Diet - low sodium heart healthy    Complete by:  As directed    Discharge instructions    Complete by:  As directed    Mr. Grover, Robinson were admitted to the hospital for difficulty breathing. You were found to have blood clots in your lungs. You were treated with a blood thinner. You were also treated for a COPD exacerbation with steroids and antibiotics. If you have difficulty breathing at home increase the flow rate of your oxygen as needed to help alleviate your symptoms. Use BiPAP only when needed. When your lungs were scanned it showed that you do not currently have active cancer in your lungs. This may be a delayed reaction to your previous chemotherapy.   To prevent further blood clots from forming you are being discharged on a blood thinner called Xarelto.  When you arrived at the hospital your heart was beating irregularly and fast. Throughout the hospitalization your heart started beating normally again and a normal rate. In order to prevent your heart rate from becoming fast and irregular, you are being discharged on a medication called Cardizem (Diltiazem). Take Cardizem 60 mg twice daily.   Increase activity slowly    Complete by:  As directed       Signed: Melanee Spry, MD 03/18/2017, 2:53 PM   Pager: [phone]

## 2017-03-15 NOTE — Progress Notes (Signed)
[facility]--RN Visit @ 1730  Phone call from Jacqlyn Krauss regarding Lovenox injections. Lovenox is covered while patient is in the hospital but is not part of the hospice benefit when returning back to the facility. Coumadin is covered under the hospice benefit.  Plan per discussion with Dr. Aggie Hacker is for patient to stay on Lovenox as Coumadin is considered substandard therapy for this circumstance. Per our conversation it was agreed that I would meet with the patient and explain options of continuing Lovenox at his expense out of pocket, Coumadin which would be covered under hospice benefit as well as option of no blood thinner.   I met with patient and had the above conversation.  Zachary Cooper biggest concern is air hunger and not being able to get a breath in. He shares with me that he knows his condition is not going to improve, and he wishes "I could just give him something to end it all, now". We discussed that I can help with advocating for BiPap at the facility--which was confirmed by Leamon Arnt, RN nurse manager and Crystal, DON that this is available to him when he returns. And that we can manage his SOB and anxiety with medications. He expresses that he just cannot seem to get the help he needs for his breathlessness in a timely manner in the facility or here in the hospital.  Dr. Lequita Halt and I met with patient in the room. He waivers on his decision about a blood thinner and his options. He did agree that he desires to stay in the hospital for treatment with antibiotics and steroids and to continue the conversations about blood thinners over the weekend after he has had a little more time to process.  We will continue to follow while in the hospital and anticipate discharge needs.  At time of discharge, [facility] will need to be called for transport as this is [facility]'s contracted provide.  Please call for any hospice related questions or  concerns.  Thank you, Margaretmary Eddy, RN, [place] Liaison [phone]  [facility] Liaisons are on [place].

## 2017-03-15 NOTE — Progress Notes (Signed)
Patient taken off of bipap and given scheduled AM treatment.  Once treatment was finished, patient on room air with sats of 96%.  Hooked nasal cannula to 2L in case patient begins to start having trouble and is readily available.  Will continue to monitor.

## 2017-03-15 NOTE — Progress Notes (Signed)
   Subjective: Patient seen and examined. He was placed on BiPAP overnight. He denies increased work of breathing or shortness of breath. He feels that his breathing has improved. He denies chest pain, fever, chills, nausea, or vomiting. He has no other complaints at this time.    Objective:  Vital signs in last 24 hours: Vitals:   03/14/17 2147 03/14/17 2343 03/15/17 0601 03/15/17 0744  BP: 114/67 95/64 138/83   Pulse:  71 69   Resp:  16 14   Temp:  97.6 F (36.4 C) 97.7 F (36.5 C)   TempSrc:  Axillary Axillary   SpO2:  98% 100% 96%  Weight:   198 lb (89.8 kg)   Height:       General: Laying in bed comfortably on BiPAP, NAD HEENT: [place]/AT, EOMI, no scleral icterus Cardiac: Regular rate, irregular rhythm, No R/M/G appreciated Pulm: On BiPAP breath sounds more audible, faint expiratory wheezes bilaterally, improved aeration throughout lung fields Abd: soft, non tender, non distended, BS normal Ext: extremities well perfused, no peripheral edema Neuro: alert and oriented X3, cranial nerves II-XII grossly intact   Assessment/Plan:  Active Problems:   Metastasis to lung (HCC)   Pulmonary embolism (HCC)   Acute on chronic respiratory failure (HCC)   Atrial fibrillation with RVR (HCC)   Head and neck cancer (HCC)   Chronic bullous emphysema (HCC)   Atrial flutter (HCC)  Acute on chronic hypoxic respiratory failure Likely multifactorial and due to a COPD exacerbation in the setting of cancer metastisis to lungs. Patient is also at high risk for PE. Patient's respiratory failure is less likely to be due to a bacterial pneumonia as the patient is afebrile with no WBC. Chest x-ray showing multifocal patchy opacities is similar to prior x-ray from February 2018. Pt on BiPAP overnight taken off this AM, currently saturating well with nasal canula. Heart rate has normalized.  -CTA of chest to rule out/in PE -Continue Ceftriaxone for COPD exacerbation -Prednisone 40 mg daily for five  days -DuoNeb every 6 hours -Therapeutic Lovenox  -Cardizem 60 mg BID  -Supplemental oxygen via [place], wean as tolerated to patient's home rate of 2L -BiPAP PRN, if oxygen saturation is stable and the patient has no increased work of breathing BiPAP is not needed  Paroxysmal A. Fib Remains in Afib/flutter, but rate controled with HR ranging from 70-80.   -Cardizem 60 mg q12 hours -Additional doses of Cardizem can be given if HR >120 -Cardiac monitoring   Troponin elevation Troponin 0.09 on admission>>0.10>>0.09>>0.12. Patient does have a history of coronary artery disease with prior MI per chart. EKG not suggestive of any acute ischemic changes. His mild troponin elevation is most likely due to demand ischemia. Per chart, last cardiac cath was done in May 2015 at [facility] (unable to access record). He is currently asymptomatic and denies CP this AM.  -On Tele -Will continue to monitor and reassess if symptoms arise  Metastatic hypopharyngeal cancer Previously followed by Dr. Alvy Bimler (oncology) and was treated with chemotherapy, radiation, and immuntherapy. CT chest in 07/2016 had findings consistent with disease spread. Treatment was terminated due to this in 08/2016 and the patient is now comfort care due to progression of his disease.  -Continue home Morphine 20 mg q4 prn  -Hospice following   Dispo: Anticipated discharge in approximately 1-2 day(s).   Melanee Spry, MD 03/15/2017, 8:23 AM Pager: [phone]

## 2017-03-15 NOTE — Progress Notes (Signed)
CSW requested by [facility] to contact patient's facility to determine if they could accept him back with high flow oxygen. [facility] will be able to accept the patient with high flow oxygen as long as it is under 10L; the max they are able to accommodate is 10L.  CSW will continue to follow to facilitate discharge back to SNF when ready.  Laveda Abbe, [place] Clinical Social Worker [phone]

## 2017-03-15 NOTE — Progress Notes (Signed)
[place] [facility]- GIP RN visit @ 0945  This is a related and covered GIP admission of 03/13/17 with [facility] diagnosis of carcinoma of the hypopharnyx, per Dr. Earlie Counts. Patient is a DNR with OOF DNR on shadow chart.EMS was activated at [facility], where he resides, when he became very short of breath and c/o chest pain.  He was admitted for treatment of acute on chronic hypoxic respiratory failure.  Visited with patient in room. No family or visitors present. Patient alert and oriented. He is on 2L [place],  sats at 83-88%. He denies feeling short of breath but he states he prefers to be on BiPap as he "knows he is getting air then". He denies any pain currently, though he states he did have some substernal chest pain earlier.   Pt is receiving Duoneb Q 6 hours, prednisone 40 mg po Q day, lovenox 135 mg Q 24 hours, Rocephin 1 Gm Q 24 hours. He has had 1 dose of Morphine 20 mg po in the last 24 hours.  [facility] will continue to follow and anticipate any discharge needs.   Please feel free to call with hospice related questions or concerns.  Thank you, Margaretmary Eddy, RN, [facility] Liaison [phone]  [place] are on AMION.

## 2017-03-16 DIAGNOSIS — Z7901 Long term (current) use of anticoagulants: Secondary | ICD-10-CM

## 2017-03-16 LAB — BASIC METABOLIC PANEL
Anion gap: 7 (ref 5–15)
BUN: 10 mg/dL (ref 6–20)
CHLORIDE: 97 mmol/L — AB (ref 101–111)
CO2: 32 mmol/L (ref 22–32)
CREATININE: 0.77 mg/dL (ref 0.61–1.24)
Calcium: 8.9 mg/dL (ref 8.9–10.3)
GFR calc Af Amer: >60 mL/min (ref >60)
GFR calc non Af Amer: >60 mL/min (ref >60)
Glucose, Bld: 95 mg/dL (ref 65–99)
Potassium: 3.9 mmol/L (ref 3.5–5.1)
SODIUM: 136 mmol/L (ref 135–145)

## 2017-03-16 LAB — CBC
HCT: 35.2 % — ABNORMAL LOW (ref 39.0–52.0)
Hemoglobin: 10.9 g/dL — ABNORMAL LOW (ref 13.0–17.0)
MCH: 26.9 pg (ref 26.0–34.0)
MCHC: 31 g/dL (ref 30.0–36.0)
MCV: 86.9 fL (ref 78.0–100.0)
PLATELETS: 135 10*3/uL — AB (ref 150–400)
RBC: 4.05 MIL/uL — ABNORMAL LOW (ref 4.22–5.81)
RDW: 13.5 % (ref 11.5–15.5)
WBC: 8.6 10*3/uL (ref 4.0–10.5)

## 2017-03-16 NOTE — Progress Notes (Signed)
[facility] ([facility]) GIP SW visit.  As previously documented, this is a related and covered GIP admission of 03/13/17 with [facility] diagnosis of carcinoma of the hypopharnyx, per Dr. Earlie Counts. Patient is a DNR with OOF DNR on shadow chart.EMS was activated at [facility], where he resides, when he became very short of breath and c/o chest pain. He was admitted for treatment of acute on chronic hypoxic respiratory failure.  Pt continues on Duoneb every 6 hrs, prednisone 40 mg PO daily, lovenox 135 mg every 24 hrs, Rocephin 1 g every 24 hs IV daily. He received 1 dose of Morphine 20 mg po this morning.  Visited with patient this morning. No family present. He tells me bi-pap helped last night and he has been told he will have a bi-pap machine when he returns to [facility]. He tells me he is not short of breath at the time of this visit and that "it is the worst feeling the world not to be able to get air." He tells me his appetite is good and he ate oatmeal for breakfast. He believes he is returning to [facility] "in a day or two." He is aware [facility] will see daily and collaborate with hospital staff until discharge.   [facility] continues to follow daily and anticipate discharge needs.   Please call with hospice related questions or concerns.   [facility] contracts with [facility] for transport to home and facilities.    Thank you,  Erling Conte, [facility] Liaison [phone]  [place] are listed on AMION.

## 2017-03-16 NOTE — Progress Notes (Signed)
Medicine attending: I examined this patient today together with resident physician Dr. Alphonzo Grieve and I concur with her evaluation and management which we discussed together.  We reviewed his CT scan together with radiology and confirmed bilateral pulmonary emboli right greater than left.  Right middle lobe lung collapse likely reason for streaky density seen on regular chest x-ray.  Of major interest is the fact that what were called bilateral upper lobe pulmonary metastases are no longer visible and may have just been areas of inflammation.  An alternative explanation is that he did have a delayed and durable response to immunotherapy with Pembrolizumab which was discontinued back in March of this year. He is now becoming dependent on BiPAP but his oxygenation has been stable and we encouraged him not to stay on the BiPAP for any prolonged period of time. He remains in atrial fibrillation with rate controlled medically.  Outstanding issue at this time is how to manage his recurrent, acute, pulmonary emboli.  International guidelines recommend parenteral anticoagulation as the treatment of choice for advanced cancer patients with thromboemboli.  Warfarin is not an acceptable alternative. If the patient was adamantly opposed to receiving a single daily injection in view of his limited prognosis I would understand this.  However, as noted above, the CT scan of his chest does not show any obvious progression of his cancer at this time.  In my opinion, it would be reasonable to continue Lovenox anticoagulation or use fondaparinux if that was more cost effective. We have limited data to suggest that the novel Xa inhibitors can be used successfully in a subset of cancer patients who are in stable remission.  This was recently reported in the [employer] with the drug Edoxaban [facility]) although there is no reason to believe that the other drugs in this class would not be acceptable  alternatives.  We will need to discuss these anticoagulation issues with hospice and come to a reasonable consensus.

## 2017-03-16 NOTE — Progress Notes (Signed)
Subjective: Had long discussion with patient and yesterday afternoon along with the hospice liaison. Patient initially seemed to lean more towards comfort care which seemed to stem mostly from not being able to afford lovenox for treatment of his PE. He did endorse improvement of his symptoms since hospitalization with treatment of his COPD exacerbation, PE, and atrial flutter and expressed wishes to continue treatment of everything else which does not align with goals of comfort care at this time.   This morning, patient states that he continued to have dyspnea on ambulation, however he does not use his oxygen when ambulating. He states that this is baseline for him at his SNF as well. He is supposed to be using 2L [place] PRN. While hospitalized he has resorted to using BiPAP for any dyspnea with exertion and continued to use it for hours even after symptoms improved.   Objective:  Vital signs in last 24 hours: Vitals:   03/15/17 2325 03/16/17 0120 03/16/17 0326 03/16/17 0726  BP: 130/84  128/67 120/85  Pulse: 93 76 91 86  Resp: 18 16 20 17   Temp: 97.9 F (36.6 C)  98 F (36.7 C) 97.9 F (36.6 C)  TempSrc: Axillary  Oral Axillary  SpO2: 98% 96% 95% 92%  Weight:   190 lb 4.8 oz (86.3 kg)   Height:       General: Laying in bed comfortably on BiPAP, NAD HEENT: [place]/AT, EOMI, no scleral icterus Cardiac: Regular rate and rhythm this morning, No R/M/G appreciated Pulm: On BiPAP breath sounds more audible, faint expiratory wheezes bilaterally, improved aeration throughout lung fields Ext: extremities well perfused, no peripheral edema Neuro: alert and oriented X3, cranial nerves II-XII grossly intact  Assessment/Plan:  Principal Problem:   Acute on chronic respiratory failure (HCC) Active Problems:   Metastasis to lung (HCC)   Pulmonary embolism (HCC)   Atrial fibrillation with RVR (HCC)   Head and neck cancer (HCC)   Chronic bullous emphysema (HCC)   Atrial flutter (HCC)  Acute on  chronic hypoxic respiratory failure Multifactorial from likely COPD exacerbation and new found, possibly bilateral LL PE's. He is having some improvement. -Continue Ceftriaxone for COPD exacerbation -Prednisone 40 mg daily for five days -DuoNeb every 6 hours -Therapeutic Lovenox  -Supplemental oxygen via , wean as tolerated to patient's home rate of 2L; have asked to have him provided with enough tubing for ambulation as that seems to be when he is having the most dyspnea -BiPAP only PRN, if oxygen saturation is stable and the patient has no increased work of breathing BiPAP is not needed; will coordinate with nursing and respiratory   PE: Patient with new LLL PE and possible remote vs acute RLL PE.  --therapeutic lovenox --will continue discussion about continuing treatment with either lovenox or warfarin at discharge; it appears that since hospice is his insurer, they are unable to cover the cost of lovenox though this is the indicated treatment for cancer associated PE  Paroxysmal A. Fib Remains in Afib/flutter, but rate controled with HR ranging from 70-80.   -Cardizem 60 mg q12 hours -Additional doses of Cardizem can be given if HR >120 -Cardiac monitoring   Troponin elevation Troponin 0.09 on admission>>0.10>>0.09>>0.12. Patient does have a history of coronary artery disease with prior MI per chart. EKG not suggestive of any acute ischemic changes. His mild troponin elevation is most likely due to demand ischemia. Per chart, last cardiac cath was done in May 2015 at [facility] (unable to access record). He  is currently asymptomatic and denies CP this AM.  -On Tele -Will continue to monitor and reassess if symptoms arise  Metastatic hypopharyngeal cancer Previously followed by Dr. Alvy Bimler (oncology) and was treated with chemotherapy, radiation, and immuntherapy. CT chest in 07/2016 had findings consistent with disease spread. Treatment was terminated due to this in 08/2016 and the patient  is now comfort care due to progression of his disease. CTA this admission shows resolution of lung disease that was thought to be mets earlier this year which indicates that either the earlier findings were inflammatory in nature, or that patient may have had delayed response to immune checkpoint inhibitors. -Continue home Morphine 20 mg q4 prn  -Hospice following - appreciate their assistance  Dispo: Anticipated discharge in approximately 1-2 day(s).   Alphonzo Grieve, MD 03/16/2017, 9:57 AM

## 2017-03-17 LAB — BASIC METABOLIC PANEL
Anion gap: 6 (ref 5–15)
BUN: 12 mg/dL (ref 6–20)
CALCIUM: 9.2 mg/dL (ref 8.9–10.3)
CHLORIDE: 98 mmol/L — AB (ref 101–111)
CO2: 32 mmol/L (ref 22–32)
CREATININE: 0.84 mg/dL (ref 0.61–1.24)
GFR calc non Af Amer: >60 mL/min (ref >60)
Glucose, Bld: 108 mg/dL — ABNORMAL HIGH (ref 65–99)
Potassium: 3.7 mmol/L (ref 3.5–5.1)
SODIUM: 136 mmol/L (ref 135–145)

## 2017-03-17 LAB — CBC
HCT: 34.7 % — ABNORMAL LOW (ref 39.0–52.0)
Hemoglobin: 10.8 g/dL — ABNORMAL LOW (ref 13.0–17.0)
MCH: 27.2 pg (ref 26.0–34.0)
MCHC: 31.1 g/dL (ref 30.0–36.0)
MCV: 87.4 fL (ref 78.0–100.0)
PLATELETS: 149 10*3/uL — AB (ref 150–400)
RBC: 3.97 MIL/uL — AB (ref 4.22–5.81)
RDW: 13.6 % (ref 11.5–15.5)
WBC: 7.1 10*3/uL (ref 4.0–10.5)

## 2017-03-17 MED ORDER — IPRATROPIUM-ALBUTEROL 0.5-2.5 (3) MG/3ML IN SOLN
3.0000 mL | Freq: Three times a day (TID) | RESPIRATORY_TRACT | Status: DC
  Administered 2017-03-18 (×2): 3 mL via RESPIRATORY_TRACT
  Filled 2017-03-17 (×3): qty 3

## 2017-03-17 NOTE — Evaluation (Signed)
Physical Therapy Evaluation Patient Details Name: Zachary Cooper MRN: [id number] DOB: 25-Jan-1951 Today's Date: 03/17/2017   History of Present Illness  Patient is a 66 year old male with a past medical history of GOLD stage III COPD, paroxysmal atrial fibrillation, congestive heart failure, hypertension, coronary artery disease (prior MI), seizures, prior CVA, prior DVT and PE, and metastatic hypopharyngeal cancer presenting to the hospital via EMS from [place] for acute onset shortness of breath. Diagnosed with new PE LLL while in hospital, and MDs are problem-solving for optimal anticoagulation  Clinical Impression   Pt admitted with above diagnosis. Pt currently with functional limitations due to the deficits listed below (see PT Problem List). O2 sats drop dramatically with activity; Dr. Aggie Hacker indicated it is OK to titrate O2 up during activity as needed;  Pt will benefit from skilled PT to increase their independence and safety with mobility to allow discharge to the venue listed below.       Follow Up Recommendations SNF    Equipment Recommendations  Rolling walker with 5" wheels;Other (comment) (RW to decr teh work of walking)    Recommendations for [employer] OT consult (energy conservation)     Precautions / Restrictions Precautions Precautions: Other (comment) Precaution Comments: watch O2 sats closely      Mobility  Bed Mobility Overal bed mobility: Independent                Transfers Overall transfer level: Needs assistance Equipment used: None Transfers: Sit to/from Stand Sit to Stand: Supervision         General transfer comment: Cues to self-monitor for activity tolerance  Ambulation/Gait Ambulation/Gait assistance: Min guard Ambulation Distance (Feet): 65 Feet Assistive device:  (pushing IV pole) Gait Pattern/deviations: Step-through pattern;Decreased step length - right;Decreased step length - left;Decreased stride length Gait  velocity: slow, but steady   General Gait Details: Cues to self-monitor for activity tolerance; had to incr supplemental O2 to 3 L and O2 sats still decr as low as 84%  Financial trader [place] (Stroke Patients Only)       Balance                                             Pertinent Vitals/Pain Pain Assessment: No/denies pain    Home Living Family/patient expects to be discharged to:: Skilled nursing facility                      Prior Function Level of Independence: Independent               Hand Dominance        Extremity/Trunk Assessment   Upper Extremity Assessment Upper Extremity Assessment: Overall WFL for tasks assessed    Lower Extremity Assessment Lower Extremity Assessment: Generalized weakness (and decr muscle endurance)       Communication      Cognition Arousal/Alertness: Awake/alert Behavior During Therapy: WFL for tasks assessed/performed Overall Cognitive Status: Within Functional Limits for tasks assessed                                        General Comments General comments (skin integrity, edema, etc.): Zachary Cooper tells me his O2  sats are typically in the low 90%s    Exercises     Assessment/Plan    PT Assessment Patient needs continued PT services  PT Problem List Decreased activity tolerance;Decreased mobility;Decreased knowledge of use of DME;Decreased knowledge of precautions;Cardiopulmonary status limiting activity       PT Treatment Interventions DME instruction;Gait training;Functional mobility training;Therapeutic activities;Therapeutic exercise;Balance training;Patient/family education    PT Goals (Current goals can be found in the Care Plan section)  Acute Rehab PT Goals Patient Stated Goal: "I'm just waiting . . . . " PT Goal Formulation: With patient Time For Goal Achievement: 03/31/17 Potential to Achieve Goals: Good     Frequency Min 2X/week   Barriers to discharge        Co-evaluation               AM-PAC PT "6 Clicks" Daily Activity  Outcome Measure Difficulty turning over in bed (including adjusting bedclothes, sheets and blankets)?: None Difficulty moving from lying on back to sitting on the side of the bed? : A Little Difficulty sitting down on and standing up from a chair with arms (e.g., wheelchair, bedside commode, etc,.)?: A Little Help needed moving to and from a bed to chair (including a wheelchair)?: None Help needed walking in hospital room?: None Help needed climbing 3-5 steps with a railing? : A Little 6 Click Score: 21    End of Session Equipment Utilized During Treatment: Oxygen Activity Tolerance: Patient tolerated treatment well Patient left: in bed;with call bell/phone within reach;Other (comment) (With Dr. Aggie Hacker in room) Nurse Communication: Mobility status PT Visit Diagnosis: Other abnormalities of gait and mobility (R26.89);Other (comment) (Decr functional capacity)    Time: 5597-4163 PT Time Calculation (min) (ACUTE ONLY): 23 min   Charges:   PT Evaluation $PT Eval Moderate Complexity: 1 Mod PT Treatments $Gait Training: 8-22 mins   PT G Codes:        Roney Marion, PT  Acute Rehabilitation Services Pager [phone] Office [phone]   Colletta [place] 03/17/2017, 5:37 PM

## 2017-03-17 NOTE — Progress Notes (Signed)
Patient has BiPAP in room and order is for PRN. Patient in no distress at this time with good O2 saturation. RT will continue to monitor.

## 2017-03-17 NOTE — Progress Notes (Signed)
[facility]  Mr. Stammer revoked his hospice benefit earlier this afternoon in order to pursue treatment outside his hospice plan of care. [facility] is happy to re-evaluate him in the future at his request. Spoke with Dr. Aggie Hacker to make her aware. Patient reports being very happy at [facility] and is looking forward to returning there upon discharge. [facility] Social Worker [employer] spoke with hospice LCSW Denyse Amass earlier today to make her aware of revocation.   Please do not hesitate to call with questions.   Erling Conte, [facility] Liaison  [phone]

## 2017-03-17 NOTE — Progress Notes (Signed)
Subjective: Patient seen and examined. He denies shortness of breath at rest, but will have SOB when ambulating short distances. He denies chest pain, nausea, or vomiting. He did not use BiPAP over night and tolerated oxygen via [place] well.   When discussing the options for antiacogulation the patient states he only wants anticoagulation treatment if he can be treated with Lovenox. Seems he wants to pursue treatment for PE, but it is medication dependent. He understands that Lovenox will not be covered by his insurance.    Objective:  Vital signs in last 24 hours: Vitals:   03/16/17 1354 03/16/17 1939 03/17/17 0318 03/17/17 0432  BP: 114/72 136/86  130/81  Pulse: 83 99 82 83  Resp: 15 16 14 19   Temp: 99 F (37.2 C) 97.8 F (36.6 C)  98 F (36.7 C)  TempSrc: Oral Oral  Oral  SpO2: 93% 95% 97% 96%  Weight:    190 lb 3.2 oz (86.3 kg)  Height:       General: Laying in bed comfortably, NAD HEENT: [place]/AT, EOMI, no scleral icterus Cardiac: Regular rate and rhythm this morning, No R/M/G appreciated Pulm: On 3L [place], faint inspiratory and expiratory wheezes bilaterally R>L Ext: extremities well perfused, no peripheral edema Neuro: alert and oriented X3, cranial nerves II-XII grossly intact  Assessment/Plan:  Principal Problem:   Acute on chronic respiratory failure (HCC) Active Problems:   Metastasis to lung (HCC)   Pulmonary embolism (HCC)   Atrial fibrillation with RVR (HCC)   Head and neck cancer (HCC)   Chronic bullous emphysema (HCC)   Atrial flutter (HCC)   Chronic anticoagulation  Acute on chronic hypoxic respiratory failure Multifactorial from likely COPD exacerbation and new found, possibly bilateral LL PE's. He is having some improvement. -Day 5 of abx treatment, last dose of Ceftriaxone for COPD exacerbation today  -Day 5 of steroid treatment, last dose of Prednisone 40 mg today -DuoNeb every 6 hours -Therapeutic Lovenox  -Supplemental oxygen via , wean as tolerated  to patient's home rate of 2L; have asked to have him provided with enough tubing for ambulation as that seems to be when he is having the most dyspnea -BiPAP only PRN, if oxygen saturation is stable and the patient has no increased work of breathing BiPAP is not needed; will coordinate with nursing and respiratory   PE: Patient with new LLL PE and possible remote vs acute RLL PE.  --therapeutic lovenox --will continue discussion about continuing treatment with either lovenox or warfarin at discharge; it appears that since hospice is his insurer, they are unable to cover the cost of lovenox though this is the indicated treatment for cancer associated PE  Paroxysmal A. Fib Remains in Afib/flutter, but rate controled with HR ranging from 70-80.   -Cardizem 60 mg q12 hours -Additional doses of Cardizem can be given if HR >120 -Cardiac monitoring   Troponin elevation Troponin 0.09 on admission>>0.10>>0.09>>0.12. Patient does have a history of coronary artery disease with prior MI per chart. EKG not suggestive of any acute ischemic changes. His mild troponin elevation is most likely due to demand ischemia. Per chart, last cardiac cath was done in May 2015 at [facility] (unable to access record). He is currently asymptomatic and denies CP this AM.  -On Tele -Will continue to monitor and reassess if symptoms arise  Metastatic hypopharyngeal cancer Previously followed by Dr. Alvy Bimler (oncology) and was treated with chemotherapy, radiation, and immuntherapy. CT chest in 07/2016 had findings consistent with disease spread. Treatment was  terminated due to this in 08/2016 and the patient is now comfort care due to progression of his disease. CTA this admission shows resolution of lung disease that was thought to be mets earlier this year which indicates that either the earlier findings were inflammatory in nature, or that patient may have had delayed response to immune checkpoint inhibitors. -Continue home Morphine  20 mg q4 prn  -Hospice following - appreciate their assistance  Dispo: Anticipated discharge in approximately 1-2 day(s).   Arvil Chaco, MD Internal Medicine PGY1 Pager # [phone]

## 2017-03-18 LAB — CBC
HCT: 36.2 % — ABNORMAL LOW (ref 39.0–52.0)
Hemoglobin: 11.1 g/dL — ABNORMAL LOW (ref 13.0–17.0)
MCH: 26.6 pg (ref 26.0–34.0)
MCHC: 30.7 g/dL (ref 30.0–36.0)
MCV: 86.6 fL (ref 78.0–100.0)
PLATELETS: 140 10*3/uL — AB (ref 150–400)
RBC: 4.18 MIL/uL — ABNORMAL LOW (ref 4.22–5.81)
RDW: 13.4 % (ref 11.5–15.5)
WBC: 7.5 10*3/uL (ref 4.0–10.5)

## 2017-03-18 LAB — BASIC METABOLIC PANEL
Anion gap: 6 (ref 5–15)
BUN: 13 mg/dL (ref 6–20)
CALCIUM: 9.1 mg/dL (ref 8.9–10.3)
CO2: 32 mmol/L (ref 22–32)
Chloride: 98 mmol/L — ABNORMAL LOW (ref 101–111)
Creatinine, Ser: 0.86 mg/dL (ref 0.61–1.24)
GFR calc Af Amer: >60 mL/min (ref >60)
GLUCOSE: 98 mg/dL (ref 65–99)
Potassium: 3.9 mmol/L (ref 3.5–5.1)
Sodium: 136 mmol/L (ref 135–145)

## 2017-03-18 MED ORDER — DILTIAZEM HCL 60 MG PO TABS: 60.0000 mg | ORAL_TABLET | Freq: Two times a day (BID) | ORAL | 3 refills | Status: DC

## 2017-03-18 MED ORDER — DILTIAZEM HCL 60 MG PO TABS: 60.0000 mg | ORAL_TABLET | Freq: Two times a day (BID) | ORAL | 3 refills | Status: AC

## 2017-03-18 NOTE — NC FL2 (Signed)
[place] LEVEL OF CARE SCREENING TOOL     IDENTIFICATION  Patient Name: Zachary Cooper Birthdate: 1951/05/28 Sex: male Admission Date (Current Location): 03/13/2017  [facility] and [place] Number:  Herbalist and Address:  [facility] [place], [place] [address], [place],  [address]      Provider Number: [id number]  Attending Physician Name and Address:  Annia Belt, MD  Relative Name and Phone Number:  Devante Capano, niece, [phone]    Current Level of Care: Hospital Recommended Level of Care: [place] Prior Approval Number:    Date Approved/Denied:   PASRR Number: [id number]  Discharge Plan: SNF    Current Diagnoses: Patient Active Problem List   Diagnosis Date Noted  . Chronic anticoagulation   . Atrial fibrillation with RVR ([place])   . Head and neck cancer ([place])   . Chronic bullous emphysema ([place])   . Atrial flutter ([place])   . Acute on chronic respiratory failure ([place]) 08/09/2016  . Paroxysmal A-fib ([place]) 08/09/2016  . Palliative care encounter   . COPD exacerbation ([place])   . Paroxysmal atrial fibrillation (HCC)   . Dehydration 08/02/2016  . Iron deficiency anemia 07/12/2016  . 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 Pancytopenia, acquired ([place]) 06/21/2016  . Deficiency anemia 06/21/2016  . Protein-calorie malnutrition, moderate ([place]) 05/10/2016  . Physical deconditioning 05/10/2016  . Cancer associated pain 04/16/2016  . Anemia of chronic disease 03/31/2016  . Poor venous access 03/14/2016  . Pulmonary embolism ([place]) 03/14/2016  . 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 Encounter for hospice care discussion 03/13/2016  . Metastasis to lung ([place]) 03/13/2016  . COPD GOLD III 03/13/2016  . Angina pectoris ([place]) 03/13/2016  . DNR (do not resuscitate) 03/13/2016  . CHF (congestive heart failure) ([place]) 03/13/2016  . Goals of care, counseling/discussion 03/13/2016    Orientation RESPIRATION  BLADDER Height & Weight     Self, Time, Situation, Place  O2, Other (Comment) (nasal cannula 2L; bipap, see additional comments for settings) Continent Weight: 190 lb 3.2 oz (86.3 kg) Height:  6\' 1"  (185.4 cm)  BEHAVIORAL SYMPTOMS/MOOD NEUROLOGICAL BOWEL NUTRITION STATUS      Continent Diet (please see DC summary)  AMBULATORY STATUS COMMUNICATION OF NEEDS Skin   Limited Assist Verbally Other (Comment) (PU mild and almost healed)                       Personal Care Assistance Level of Assistance  Bathing, Dressing, Feeding Bathing Assistance: Limited assistance Feeding assistance: Independent Dressing Assistance: Limited assistance     Functional Limitations Info  Sight, Hearing, Speech Sight Info: Adequate Hearing Info: Adequate Speech Info: Adequate    SPECIAL CARE FACTORS FREQUENCY  PT (By licensed PT)     PT Frequency: 5x/week              Contractures Contractures Info: Not present    Additional Factors Info  Code Status, Allergies Code Status Info: DNR Allergies Info: lisinopril           Current Medications (03/18/2017):  This is the current hospital active medication list Current Facility-Administered Medications  Medication Dose Route Frequency Provider Last Rate Last Dose  . acetaminophen (TYLENOL) tablet 650 mg  650 mg Oral Q6H PRN Shela Leff, MD   650 mg at 03/14/17 0045   Or  . acetaminophen (TYLENOL) suppository 650 mg  650 mg Rectal Q6H PRN Shela Leff, MD      . alum &  mag hydroxide-simeth (MAALOX/MYLANTA) 200-200-20 MG/5ML suspension 30 mL  30 mL Oral Q4H PRN Annia Belt, MD      . diltiazem (CARDIZEM) tablet 60 mg  60 mg Oral Q12H Melanee Spry, MD   60 mg at 03/18/17 0903  . enoxaparin (LOVENOX) injection 135 mg  1.5 mg/kg Subcutaneous Q24H Alphonzo Grieve, MD   135 mg at 03/18/17 0904  . gabapentin (NEURONTIN) capsule 100 mg  100 mg Oral Daily Shela Leff, MD   100 mg at 03/18/17 0903  .  ipratropium-albuterol (DUONEB) 0.5-2.5 (3) MG/3ML nebulizer solution 3 mL  3 mL Nebulization TID Annia Belt, MD   3 mL at 03/18/17 1341  . loratadine (CLARITIN) tablet 10 mg  10 mg Oral Daily Shela Leff, MD   10 mg at 03/18/17 0903  . morphine CONCENTRATE 10 MG/0.5ML oral solution 20 mg  20 mg Oral Q4H PRN Shela Leff, MD   20 mg at 03/18/17 0904  . pantoprazole (PROTONIX) EC tablet 40 mg  40 mg Oral Daily Shela Leff, MD   40 mg at 03/18/17 0903  . sodium chloride flush (NS) 0.9 % injection 10-40 mL  10-40 mL Intracatheter PRN Annia Belt, MD      . traZODone (DESYREL) tablet 100 mg  100 mg Oral QHS Shela Leff, MD   100 mg at 03/17/17 2147     Discharge Medications: Please see discharge summary for a list of discharge medications.  Relevant Imaging Results:  Relevant Lab Results:   Additional Information SSN: [id number]; bipap settings: full face mask large, set rate 12, resp rate 16, IPAP 10, EPAP 6, O2% 40, minute vent 10.5, leak 47, peak inspiratory pressure 10, tidal volume 562  Estanislado Emms, LCSW

## 2017-03-18 NOTE — Progress Notes (Signed)
Patient will discharge to [facility]. Anticipated discharge date: 03/18/17 Family notified: Orlene Erm, niece Transportation by: [facility]  Nurse to call report to [phone].   CSW signing off.  Estanislado Emms, [place]  Clinical Social Worker

## 2017-03-18 NOTE — Clinical Social Work Placement (Signed)
   CLINICAL SOCIAL WORK PLACEMENT  NOTE  Date:  03/18/2017  Patient Details  Name: Zachary Cooper MRN: [id number] Date of Birth: [birth year]  Clinical Social Work is seeking post-discharge placement for this patient at the [place] level of care (*CSW will initial, date and re-position this form in  chart as items are completed):  Yes   Patient/family provided with [facility] Work Department's list of facilities offering this level of care within the geographic area requested by the patient (or if unable, by the patient's family).  Yes   Patient/family informed of their freedom to choose among providers that offer the needed level of care, that participate in Medicare, Medicaid or managed care program needed by the patient, have an available bed and are willing to accept the patient.  Yes   Patient/family informed of [facility]'s ownership interest in [facility] and [facility], as well as of the fact that they are under no obligation to receive care at these facilities.  PASRR submitted to EDS on       PASRR number received on       Existing PASRR number confirmed on 03/18/17     FL2 transmitted to all facilities in geographic area requested by pt/family on 03/18/17     FL2 transmitted to all facilities within larger geographic area on       Patient informed that his/her managed care company has contracts with or will negotiate with certain facilities, including the following:  [facility]     Yes   Patient/family informed of bed offers received.  Patient chooses bed at [facility]     Physician recommends and patient chooses bed at      Patient to be transferred to [facility] on 03/18/17.  Patient to be transferred to facility by [facility]     Patient family notified on 03/18/17 of transfer.  Name of family member notified:  Layken Doenges, niece, [phone]     PHYSICIAN Please prepare priority discharge  summary, including medications, Please prepare prescriptions     Additional Comment:    _______________________________________________ Estanislado Emms, LCSW 03/18/2017, 2:52 PM

## 2017-03-18 NOTE — Progress Notes (Signed)
   Subjective:   Patient seen and examined. He did not use BiPAP over night and continues to tolerate supplemental oxygen via [place] well.  He overall feels improved since admission. He continues to have intermittent acute onset SOB and have difficulty "getting air in." IT was discussed with him that he should utilize his home oxygen and increase the rate if required.  It was discussed with the patient that he no longer has active metastasis in his lungs. He has revoked his hopsice for the interim and will placed on Xarelto for his bilateral PE. He was told he can be reevaluated for Hospice at anytime. The patient is in agreement with this plan and is agreeable to discharge today.     Objective:  Vital signs in last 24 hours: Vitals:   03/17/17 2022 03/17/17 2128 03/18/17 0402 03/18/17 0806  BP:  (!) 172/79 (!) 160/97 (!) 167/111  Pulse: 86 (!) 103 98   Resp: 18 (!) 21 16   Temp:  98.1 F (36.7 C) 98.4 F (36.9 C) 97.8 F (36.6 C)  TempSrc:  Oral Oral Oral  SpO2: 96% 97% 92% (!) 82%  Weight:   190 lb 3.2 oz (86.3 kg)   Height:       General: Sitting on the side of bed eating breakfast comfortably, NAD HEENT: [employer]/AT, EOMI, no scleral icterus Cardiac: Tachycardic, regular rhythm, No R/M/G appreciated Pulm: On 3L [place], decreased breath sounds bilaterally Ext: extremities well perfused, no peripheral edema Neuro: alert and oriented X3, cranial nerves II-XII grossly intact  Assessment/Plan:  Principal Problem:   Acute on chronic respiratory failure (HCC) Active Problems:   Metastasis to lung (HCC)   Pulmonary embolism (HCC)   Atrial fibrillation with RVR (HCC)   Head and neck cancer (HCC)   Chronic bullous emphysema (HCC)   Atrial flutter (HCC)   Chronic anticoagulation  Acute on chronic hypoxic respiratory failure Multifactorial from likely COPD exacerbation and new found, bilateral LL PE's. Improved since admission. COPD exacerbation treatment is complete. He received 5 days of  ceftriaxone and 5 days of prednisone.  -Therapeutic lovenox for PEs >> will transition to Xarelto upon discharge -DuoNeb every 6 hours -Supplemental oxygen via [place], wean as tolerated to patient's home rate of 2-3L  PE: Patient with new LLL PE and possible remote vs acute RLL PE.  -Therapeutic lovenox for PEs >> will transition to Xarelto upon discharge -Now that he revoked hospice and is going to switch to Medicare, Xarelto will be covered   Paroxysmal A. Fib Converted but having sinus tachycardia today -Cardizem 60 mg q12 hours >> will d/c on 120 mg QD -Cardiac monitoring   Troponin elevation Troponin 0.09 on admission>>0.10>>0.09>>0.12. Patient does have a history of coronary artery disease with prior MI per chart. EKG not suggestive of any acute ischemic changes. His mild troponin elevation is most likely due to demand ischemia and PEs.   Metastatic hypopharyngeal cancer CTA this admission shows resolution of lung disease that was thought to be mets earlier this year which indicates that either the earlier findings were inflammatory in nature, or that patient may have had delayed response to immune checkpoint inhibitors. -Continue home Morphine 20 mg q4 prn  -Hospice revoked to pursue treatments outside the scope of hospice, can be reevaluated in the future -Will coordinate appointment with Dr. Alvy Bimler for follow up  Dispo: Anticipated discharge today.   Arvil Chaco, MD Internal Medicine PGY1 Pager # [phone]

## 2017-10-09 DEATH — deceased

## 2018-08-15 IMAGING — US IR US GUIDE VASC ACCESS RIGHT
1 series · 1 of 1 positions shown · non-contrast
Comparison: none

INDICATION: 65-year-old with metastatic hypopharyngeal cancer.

[Series 1: ir fluoro/shunt/fist · 1 of 1 slices shown]
[im 1/1]
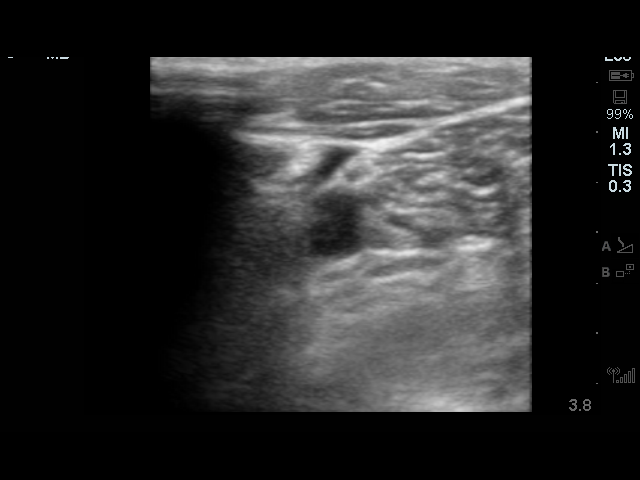

[1 of 1 positions shown; findings below may reference images not displayed]

EXAM:
FLUOROSCOPIC AND ULTRASOUND GUIDED PLACEMENT OF A SUBCUTANEOUS PORT.

MEDICATIONS:
Ancef 2 g; As antibiotic prophylaxis, Ancef was ordered
pre-procedure and administered intravenously within one hour of
incision.

ANESTHESIA/SEDATION:
Versed 3.5 mg IV; Fentanyl 175 mcg IV;

Moderate Sedation Time:  43 minutes

The patient was continuously monitored during the procedure by the
interventional radiology nurse under my direct supervision.

FLUOROSCOPY TIME:  1 minutes, 6 mGy

COMPLICATIONS:
None immediate.

CONTRAST:  1 mL [id number]

PROCEDURE:
The risks of the procedure were explained to the patient. Informed
consent was obtained. Patient was placed supine on the
interventional table. Right internal jugular vein was not identified
with ultrasound and presumed to be chronically occluded. Ultrasound
confirmed a patent left internal jugular vein. The left chest and
neck were cleaned with a skin antiseptic and a sterile drape was
placed. Maximal barrier sterile technique was utilized including
caps, mask, sterile gowns, sterile gloves, sterile drape, hand
hygiene and skin antiseptic. The left neck was anesthetized with 1%
lidocaine. Small incision was made in the left neck with a blade.
Micropuncture set was placed in the left IJ with ultrasound
guidance. The micropuncture wire was used for measurement purposes.
The left chest was anesthetized with 1% lidocaine with epinephrine.
#15 blade was used to make an incision and a subcutaneous port
pocket was formed. 8 french Power Port was assembled. Subcutaneous
tunnel was formed with a stiff tunneling device. The port catheter
was brought through the subcutaneous tunnel. The port was placed in
the subcutaneous pocket. The micropuncture set was exchanged for a
peel-away sheath. The catheter was placed through the peel-away
sheath. Fluoroscopy demonstrated the catheter was in the azygos
vein. The catheter would not move with flushing. Therefore, the
catheter was removed from the pocket and disconnected form the port.
The catheter was repositioned in the SVC with a Bentson wire. The
port was then re- attached to the catheter and placed back in the
pocket. Small amount of dilute contrast was injected through the
port to confirm no leakage and no discontinuity. Catheter positioned
at the superior cavoatrial junction. Catheter placement was
confirmed with fluoroscopy. The port was accessed and flushed with
heparinized saline. The port pocket was closed using two layers of
absorbable sutures and Dermabond. The vein skin site was closed
using a single layer of absorbable suture and Dermabond. Sterile
dressings were applied. Patient tolerated the procedure well without
an immediate complication. Ultrasound and fluoroscopic images were
taken and saved for this procedure.
IMPRESSION: Placement of a subcutaneous port device. Catheter tip at the
superior cavoatrial junction.

## 2018-10-08 IMAGING — CR DG CHEST 2V
3 series · 3 of 3 positions shown · non-contrast
Comparison: CT chest 04/10/2016 and chest radiograph 03/29/2016.

CLINICAL DATA: Shortness of breath and weakness, lung cancer. Fall
last week, right rib pain, initial encounter.

EXAM:
CHEST  2 VIEW

[w chest lat (1 of 2)]
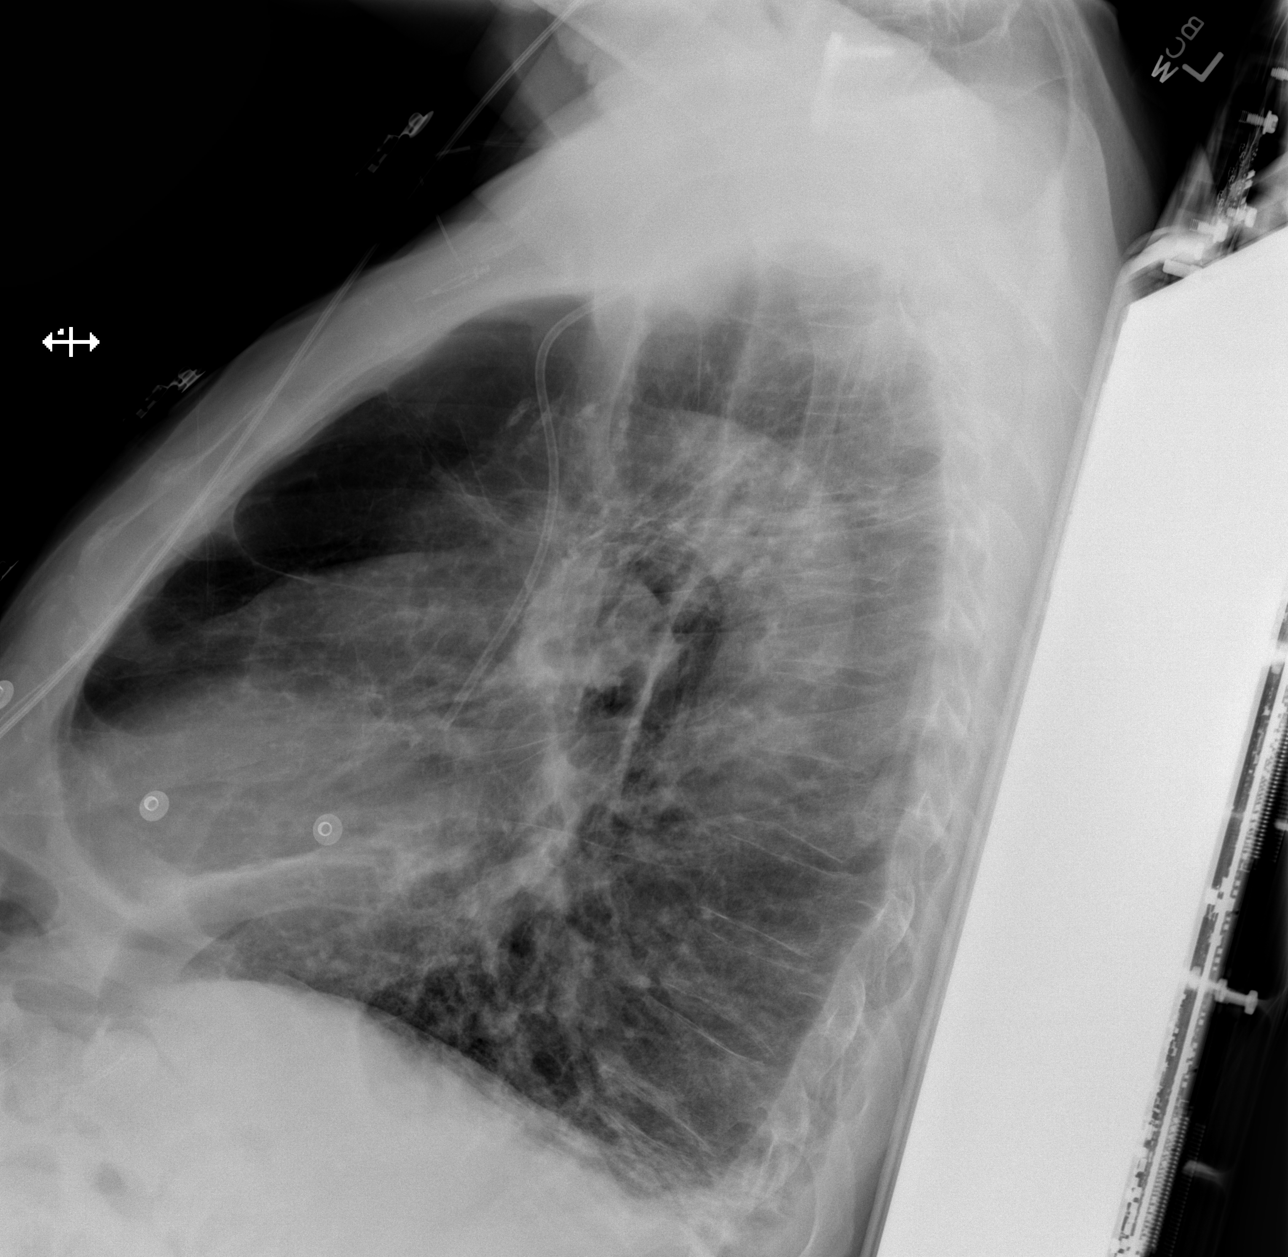

[w chest lat (2 of 2)]
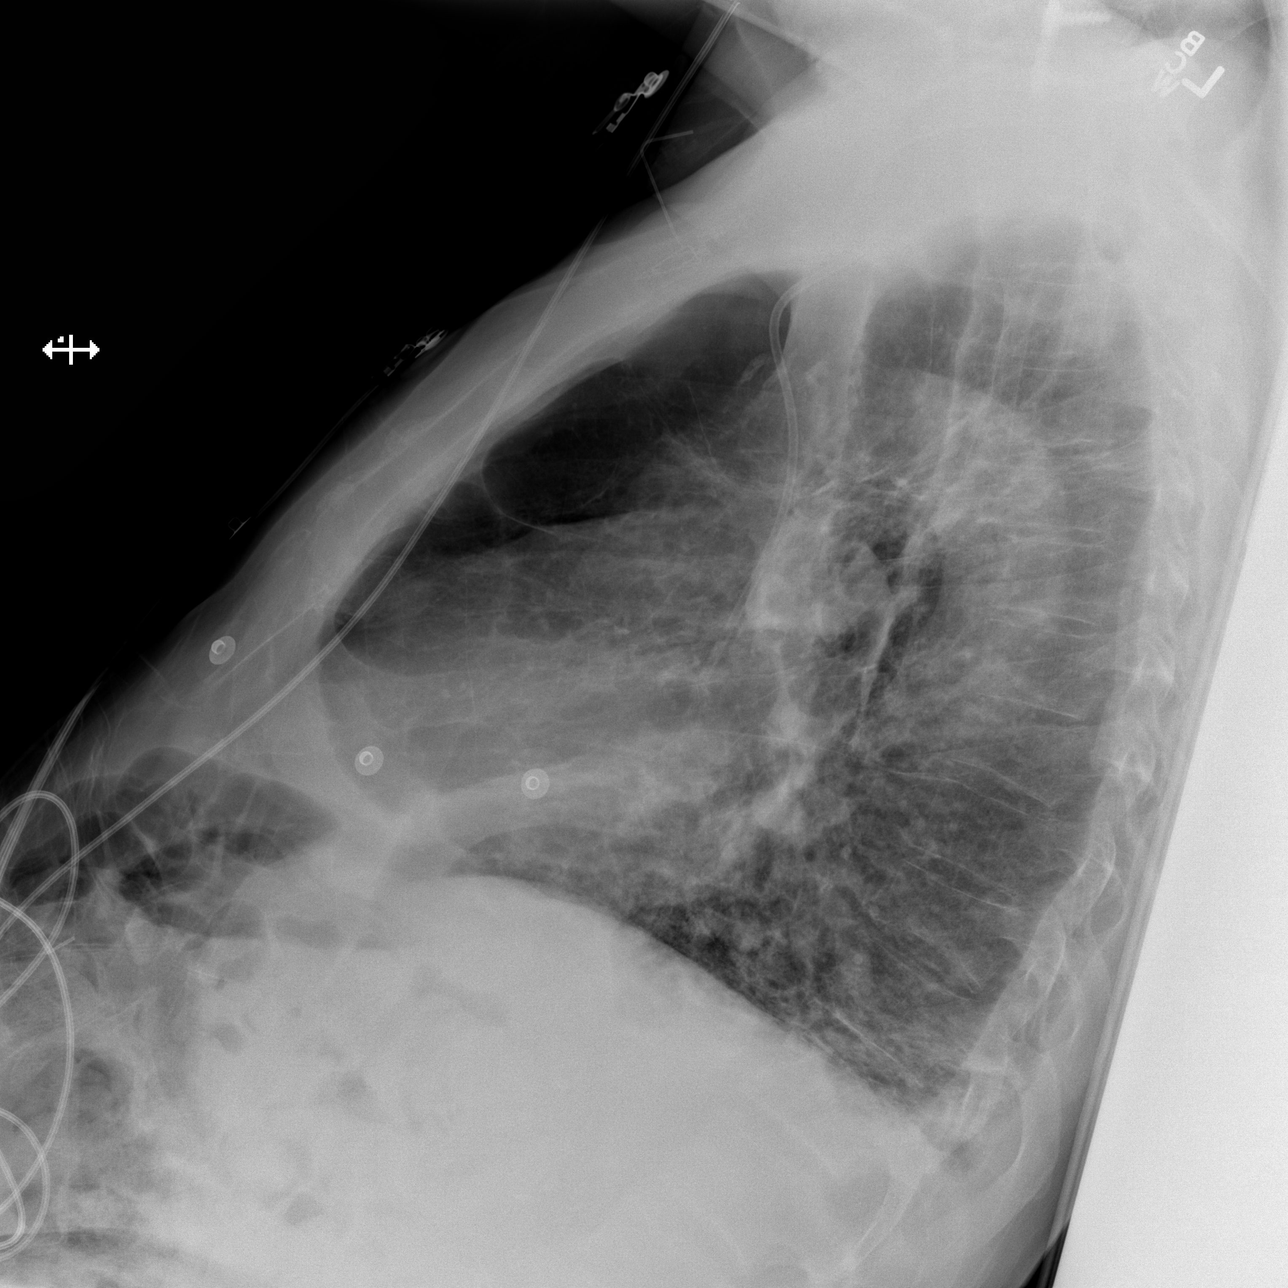

[x chest ap]
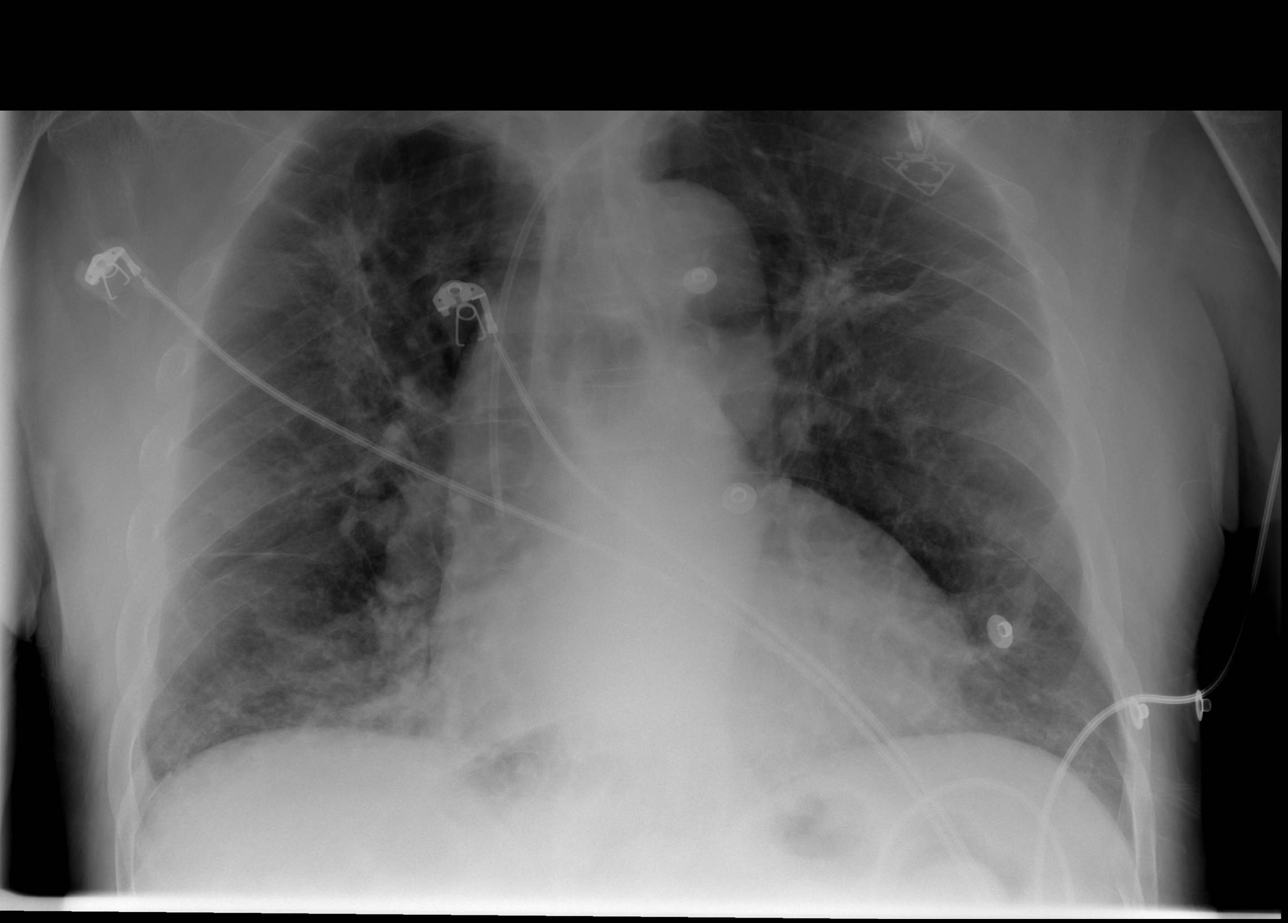

[3 of 3 positions shown; findings below may reference images not displayed]

FINDINGS: Trachea is midline. Left IJ power port tip projects at the SVC RA
junction. Heart size stable. Irregular nodular lesion in the right
suprahilar region is new. Left suprahilar irregular nodular
consolidation is grossly unchanged. Mild right basilar volume loss.
Bullous changes are seen anteriorly. No definite pleural fluid.
IMPRESSION: 1. New irregular nodular opacification in the right suprahilar
region may be infectious or inflammatory in etiology. Metastatic
disease is not excluded. CT chest with contrast may be helpful in
further evaluation, as clinically indicated.
2. Irregular left upper lobe nodularity, similar and better
evaluated on 04/10/2016.
3. Mild right basilar volume loss may be due to scarring. Difficult
to exclude pneumonia.
